# Patient Record
Sex: Female | Born: 1935 | Race: Black or African American | Hispanic: No | Smoking: Former smoker
Health system: Southern US, Community
[De-identification: ages and names within clinical notes are randomized; demographics above are authoritative.]

## PROBLEM LIST (undated history)

## (undated) DIAGNOSIS — E785 Hyperlipidemia, unspecified: Secondary | ICD-10-CM

## (undated) DIAGNOSIS — I10 Essential (primary) hypertension: Secondary | ICD-10-CM

## (undated) DIAGNOSIS — C569 Malignant neoplasm of unspecified ovary: Secondary | ICD-10-CM

## (undated) HISTORY — DX: Essential (primary) hypertension: I10

## (undated) HISTORY — DX: Hyperlipidemia, unspecified: E78.5

## (undated) HISTORY — PX: ABDOMINAL HYSTERECTOMY: SHX81

---

## 1997-08-01 ENCOUNTER — Other Ambulatory Visit: Admission: RE | Admit: 1997-08-01 | Discharge: 1997-08-01 | Payer: Self-pay | Admitting: Obstetrics & Gynecology

## 1998-10-27 ENCOUNTER — Other Ambulatory Visit: Admission: RE | Admit: 1998-10-27 | Discharge: 1998-10-27 | Payer: Self-pay | Admitting: *Deleted

## 1999-02-18 ENCOUNTER — Encounter: Payer: Self-pay | Admitting: Internal Medicine

## 1999-02-18 ENCOUNTER — Encounter: Admission: RE | Admit: 1999-02-18 | Discharge: 1999-02-18 | Payer: Self-pay | Admitting: Internal Medicine

## 2000-02-05 ENCOUNTER — Other Ambulatory Visit: Admission: RE | Admit: 2000-02-05 | Discharge: 2000-02-05 | Payer: Self-pay | Admitting: Obstetrics and Gynecology

## 2001-09-13 ENCOUNTER — Other Ambulatory Visit: Admission: RE | Admit: 2001-09-13 | Discharge: 2001-09-13 | Payer: Self-pay | Admitting: Obstetrics and Gynecology

## 2004-05-13 ENCOUNTER — Other Ambulatory Visit: Admission: RE | Admit: 2004-05-13 | Discharge: 2004-05-13 | Payer: Self-pay | Admitting: Obstetrics and Gynecology

## 2004-05-19 ENCOUNTER — Ambulatory Visit (HOSPITAL_COMMUNITY): Admission: RE | Admit: 2004-05-19 | Discharge: 2004-05-19 | Payer: Self-pay | Admitting: Obstetrics and Gynecology

## 2004-11-24 ENCOUNTER — Ambulatory Visit: Payer: Self-pay | Admitting: Internal Medicine

## 2004-12-01 ENCOUNTER — Ambulatory Visit: Payer: Self-pay | Admitting: Internal Medicine

## 2005-05-31 ENCOUNTER — Ambulatory Visit: Payer: Self-pay | Admitting: Internal Medicine

## 2005-06-02 ENCOUNTER — Ambulatory Visit: Payer: Self-pay | Admitting: Internal Medicine

## 2005-07-20 ENCOUNTER — Ambulatory Visit: Payer: Self-pay | Admitting: Internal Medicine

## 2005-08-25 ENCOUNTER — Ambulatory Visit: Payer: Self-pay | Admitting: Internal Medicine

## 2005-11-25 ENCOUNTER — Ambulatory Visit: Payer: Self-pay | Admitting: Internal Medicine

## 2005-12-05 ENCOUNTER — Encounter: Admission: RE | Admit: 2005-12-05 | Discharge: 2005-12-05 | Payer: Self-pay | Admitting: Internal Medicine

## 2006-12-21 ENCOUNTER — Encounter: Payer: Self-pay | Admitting: *Deleted

## 2006-12-21 DIAGNOSIS — E785 Hyperlipidemia, unspecified: Secondary | ICD-10-CM

## 2006-12-21 DIAGNOSIS — I10 Essential (primary) hypertension: Secondary | ICD-10-CM | POA: Insufficient documentation

## 2006-12-21 DIAGNOSIS — Z9079 Acquired absence of other genital organ(s): Secondary | ICD-10-CM | POA: Insufficient documentation

## 2007-01-16 ENCOUNTER — Ambulatory Visit: Payer: Self-pay | Admitting: Internal Medicine

## 2007-01-16 LAB — CONVERTED CEMR LAB
ALT: 25 units/L (ref 0–35)
AST: 29 units/L (ref 0–37)
Albumin: 4.2 g/dL (ref 3.5–5.2)
Alkaline Phosphatase: 62 units/L (ref 39–117)
BUN: 16 mg/dL (ref 6–23)
Bilirubin, Direct: 0.1 mg/dL (ref 0.0–0.3)
CO2: 30 meq/L (ref 19–32)
Calcium: 9.5 mg/dL (ref 8.4–10.5)
Chloride: 103 meq/L (ref 96–112)
Cholesterol: 181 mg/dL (ref 0–200)
Creatinine, Ser: 0.7 mg/dL (ref 0.4–1.2)
GFR calc Af Amer: 107 mL/min
GFR calc non Af Amer: 88 mL/min
Glucose, Bld: 116 mg/dL — ABNORMAL HIGH (ref 70–99)
HDL: 53.8 mg/dL (ref >39.0)
Hgb A1c MFr Bld: 7 % — ABNORMAL HIGH (ref 4.6–6.0)
LDL Cholesterol: 114 mg/dL — ABNORMAL HIGH (ref 0–99)
Potassium: 4.3 meq/L (ref 3.5–5.1)
Sodium: 140 meq/L (ref 135–145)
Total Bilirubin: 1 mg/dL (ref 0.3–1.2)
Total CHOL/HDL Ratio: 3.4
Total Protein: 7.7 g/dL (ref 6.0–8.3)
Triglycerides: 64 mg/dL (ref 0–149)
VLDL: 13 mg/dL (ref 0–40)

## 2007-01-20 ENCOUNTER — Encounter: Payer: Self-pay | Admitting: Internal Medicine

## 2007-01-24 ENCOUNTER — Ambulatory Visit: Payer: Self-pay | Admitting: Internal Medicine

## 2007-02-07 ENCOUNTER — Encounter: Payer: Self-pay | Admitting: Internal Medicine

## 2007-02-07 ENCOUNTER — Ambulatory Visit: Payer: Self-pay | Admitting: Internal Medicine

## 2007-02-12 ENCOUNTER — Encounter: Payer: Self-pay | Admitting: Internal Medicine

## 2007-02-14 ENCOUNTER — Telehealth: Payer: Self-pay | Admitting: Internal Medicine

## 2007-02-16 ENCOUNTER — Ambulatory Visit: Payer: Self-pay | Admitting: Internal Medicine

## 2008-04-02 ENCOUNTER — Ambulatory Visit: Payer: Self-pay | Admitting: Internal Medicine

## 2008-04-02 DIAGNOSIS — E119 Type 2 diabetes mellitus without complications: Secondary | ICD-10-CM | POA: Insufficient documentation

## 2008-04-02 LAB — CONVERTED CEMR LAB
ALT: 18 units/L (ref 0–35)
AST: 21 units/L (ref 0–37)
Albumin: 4.1 g/dL (ref 3.5–5.2)
Alkaline Phosphatase: 51 units/L (ref 39–117)
BUN: 13 mg/dL (ref 6–23)
Bilirubin, Direct: 0.1 mg/dL (ref 0.0–0.3)
CO2: 32 meq/L (ref 19–32)
Calcium: 9.7 mg/dL (ref 8.4–10.5)
Chloride: 106 meq/L (ref 96–112)
Cholesterol: 178 mg/dL (ref 0–200)
Creatinine, Ser: 0.8 mg/dL (ref 0.4–1.2)
GFR calc Af Amer: 91 mL/min
GFR calc non Af Amer: 75 mL/min
Glucose, Bld: 119 mg/dL — ABNORMAL HIGH (ref 70–99)
HDL: 55.3 mg/dL (ref >39.0)
Hgb A1c MFr Bld: 7.2 % — ABNORMAL HIGH (ref 4.6–6.0)
LDL Cholesterol: 105 mg/dL — ABNORMAL HIGH (ref 0–99)
Potassium: 3.5 meq/L (ref 3.5–5.1)
Sodium: 142 meq/L (ref 135–145)
TSH: 1.22 microintl units/mL (ref 0.35–5.50)
Total Bilirubin: 0.7 mg/dL (ref 0.3–1.2)
Total CHOL/HDL Ratio: 3.2
Total Protein: 7.5 g/dL (ref 6.0–8.3)
Triglycerides: 88 mg/dL (ref 0–149)
VLDL: 18 mg/dL (ref 0–40)

## 2008-04-09 ENCOUNTER — Encounter: Payer: Self-pay | Admitting: Internal Medicine

## 2008-04-10 ENCOUNTER — Encounter: Payer: Self-pay | Admitting: Internal Medicine

## 2008-08-26 ENCOUNTER — Encounter: Payer: Self-pay | Admitting: Internal Medicine

## 2008-09-11 ENCOUNTER — Encounter: Payer: Self-pay | Admitting: Internal Medicine

## 2009-03-17 ENCOUNTER — Telehealth: Payer: Self-pay | Admitting: Internal Medicine

## 2009-04-15 ENCOUNTER — Encounter: Payer: Self-pay | Admitting: Internal Medicine

## 2009-04-16 ENCOUNTER — Telehealth (INDEPENDENT_AMBULATORY_CARE_PROVIDER_SITE_OTHER): Payer: Self-pay | Admitting: *Deleted

## 2009-07-17 ENCOUNTER — Ambulatory Visit: Payer: Self-pay | Admitting: Internal Medicine

## 2009-07-17 ENCOUNTER — Telehealth: Payer: Self-pay | Admitting: Internal Medicine

## 2009-07-17 LAB — CONVERTED CEMR LAB
ALT: 20 units/L (ref 0–35)
AST: 23 units/L (ref 0–37)
Albumin: 4.2 g/dL (ref 3.5–5.2)
Alkaline Phosphatase: 61 units/L (ref 39–117)
BUN: 17 mg/dL (ref 6–23)
Basophils Absolute: 0 10*3/uL (ref 0.0–0.1)
Basophils Relative: 0.3 % (ref 0.0–3.0)
Bilirubin, Direct: 0.1 mg/dL (ref 0.0–0.3)
CO2: 32 meq/L (ref 19–32)
Calcium: 9.5 mg/dL (ref 8.4–10.5)
Chloride: 102 meq/L (ref 96–112)
Cholesterol: 158 mg/dL (ref 0–200)
Creatinine, Ser: 0.7 mg/dL (ref 0.4–1.2)
Eosinophils Absolute: 0.1 10*3/uL (ref 0.0–0.7)
Eosinophils Relative: 2.3 % (ref 0.0–5.0)
GFR calc non Af Amer: 115.25 mL/min (ref >60)
Glucose, Bld: 111 mg/dL — ABNORMAL HIGH (ref 70–99)
HCT: 37.3 % (ref 36.0–46.0)
HDL: 52.8 mg/dL (ref >39.00)
Hemoglobin: 12 g/dL (ref 12.0–15.0)
Hgb A1c MFr Bld: 7.5 % — ABNORMAL HIGH (ref 4.6–6.5)
LDL Cholesterol: 85 mg/dL (ref 0–99)
Lymphocytes Relative: 36.1 % (ref 12.0–46.0)
Lymphs Abs: 2 10*3/uL (ref 0.7–4.0)
MCHC: 32.3 g/dL (ref 30.0–36.0)
MCV: 72.2 fL — ABNORMAL LOW (ref 78.0–100.0)
Monocytes Absolute: 0.4 10*3/uL (ref 0.1–1.0)
Monocytes Relative: 6.9 % (ref 3.0–12.0)
Neutro Abs: 3 10*3/uL (ref 1.4–7.7)
Neutrophils Relative %: 54.4 % (ref 43.0–77.0)
Platelets: 215 10*3/uL (ref 150.0–400.0)
Potassium: 4.3 meq/L (ref 3.5–5.1)
RBC: 5.16 M/uL — ABNORMAL HIGH (ref 3.87–5.11)
RDW: 15.1 % — ABNORMAL HIGH (ref 11.5–14.6)
Sodium: 141 meq/L (ref 135–145)
TSH: 1.3 microintl units/mL (ref 0.35–5.50)
Total Bilirubin: 0.6 mg/dL (ref 0.3–1.2)
Total CHOL/HDL Ratio: 3
Total Protein: 6.9 g/dL (ref 6.0–8.3)
Triglycerides: 100 mg/dL (ref 0.0–149.0)
VLDL: 20 mg/dL (ref 0.0–40.0)
WBC: 5.5 10*3/uL (ref 4.5–10.5)

## 2010-03-31 NOTE — Assessment & Plan Note (Signed)
Summary: YEARLY FU/ MEDICARE/ NWS  #   Vital Signs:  Patient profile:   75 year old female Height:      65 inches Weight:      176 pounds BMI:     29.39 O2 Sat:      98 % on Room air Temp:     97.1 degrees F oral Pulse rate:   72 / minute BP sitting:   136 / 88  (left arm) Cuff size:   regular  Vitals Entered By: Bill Salinas CMA (Jul 17, 2009 10:10 AM)  O2 Flow:  Room air CC: pt here for cpx, she is due for a tetanus shot. she has never had pneumonia vaccine or shingles vaccine. Pt has never had bone density/ ab  Vision Screening:      Vision Comments: Pt had last eye exam ? of about 2 years ago at [facility]. She is due for an eye exam.   Primary Care Provider:  [facility]  CC:  pt here for cpx and she is due for a tetanus shot. she has never had pneumonia vaccine or shingles vaccine. Pt has never had bone density/ ab.  History of Present Illness: Patient is here for yearly physical exam.  She reports relatively good health over the year.  She describes some cough symptoms throughout the spring months that she attributes to allergy.  She also describes occasional difficulty with sleep.    She has checked home blood pressures and gets readings from 120-130/80 or less.    Patient has had difficulty with maintaining an exercise program.  She does say that she has made an effort to decrease starch and sugar intake.    Current Medications (verified): 1)  Vytorin 10-40 Mg  Tabs (Ezetimibe-Simvastatin) .... Take One Tablet Once Daily 2)  Multivitamins   Tabs (Multiple Vitamin) .Marland Kitchen.. 1 By Mouth Every Other Day 3)  Lisinopril-Hydrochlorothiazide 20-12.5 Mg Tabs (Lisinopril-Hydrochlorothiazide) .Marland Kitchen.. 1 By Mouth Once Daily  Allergies (verified): 1)  ! Codeine  Past History:  Past Medical History: Last updated: 2008-04-24 DIABETES MELLITUS, TYPE II, MILD (ICD-250.00) HYPERLIPIDEMIA (ICD-272.4) HYPERTENSION, BORDERLINE (ICD-401.9)  Past Surgical History: Last updated:  12/21/2006 TOTAL ABDOMINAL HYSTERECTOMY, HX OF (ICD-V45.77)  Family History: Last updated: 04-24-2008 mother-deceased @ 58: HTN, CVA father - deceased @ 39: ?? Neg- breast or colon cancer; CAD Brother - DM Brother - bone cancer  Social History: Last updated: 2008-04-24 HSG married '59 - 60yrs/divorced; married '71 - 45yrs/divorced 1 son - '74 Autistic (at home); 1 daughter - '70; 2 grandchildren Retired from [employer]  Review of Systems  The patient denies anorexia, fever, weight loss, weight gain, vision loss, decreased hearing, chest pain, syncope, dyspnea on exertion, peripheral edema, prolonged cough, headaches, abdominal pain, melena, hematochezia, severe indigestion/heartburn, muscle weakness, difficulty walking, unusual weight change, abnormal bleeding, and enlarged lymph nodes.    Physical Exam  General:  Alert, welldeveloped woman who appears younger than chronological age. Head:  normocephalic and atraumatic.   Eyes:  vision grossly intact, pupils equal, pupils round, pupils reactive to light, and no injection.  Eye exam attempted but limited due to constricted pupils. Ears:  R ear normal and L ear normal.   Nose:  no external deformity.   Mouth:  pharynx pink and moist.   Neck:  supple, no masses, no thyromegaly, and no thyroid nodules or tenderness.   Breasts:  skin/areolae normal, no masses, no abnormal thickening, no nipple discharge, no tenderness, and no adenopathy.   Lungs:  normal  respiratory effort, no accessory muscle use, normal breath sounds, no crackles, and no wheezes.   Heart:  normal rate, regular rhythm, no murmur, no gallop, and no rub.   Abdomen:  soft, non-tender, normal bowel sounds, no distention, no masses, no guarding, no rigidity, no rebound tenderness, no hepatomegaly, and no splenomegaly.   Msk:  normal ROM, no joint tenderness, no joint swelling, no joint warmth, and no redness over joints.   Pulses:  R radial normal.   Extremities:  no pedal  edema Neurologic:  alert & oriented X3, cranial nerves II-XII intact, strength normal in all extremities, and gait normal.   Cervical Nodes:  no anterior cervical adenopathy and no posterior cervical adenopathy.    Diabetes Management Exam:    Foot Exam (with socks and/or shoes not present):       Sensory-Pinprick/Light touch:          Left medial foot (L-4): normal          Left dorsal foot (L-5): normal          Left lateral foot (S-1): normal          Right medial foot (L-4): normal          Right dorsal foot (L-5): normal          Right lateral foot (S-1): normal       Inspection:          Left foot: normal          Right foot: normal       Nails:          Left foot: normal          Right foot: normal   Impression & Recommendations:  Problem # 1:  HYPERTENSION, BORDERLINE (ICD-401.9) Home blood pressure readings are at goal.  Patient has been adherent to medication plan.    Plan- No changes.  Check electrolytes.  Her updated medication list for this problem includes:    Lisinopril-hydrochlorothiazide 20-12.5 Mg Tabs (Lisinopril-hydrochlorothiazide) .Marland Kitchen... 1 by mouth once daily  Orders: TLB-BMP (Basic Metabolic Panel-BMET) (80048-METABOL) TLB-CBC Platelet - w/Differential (85025-CBCD)  Addendum - lab results are fine  Problem # 2:  HYPERLIPIDEMIA (ICD-272.4) Patient has had no difficulty with her medication.  Plan- Continue medication          Check lipids and LFTs.  Her updated medication list for this problem includes:    Vytorin 10-40 Mg Tabs (Ezetimibe-simvastatin) .Marland Kitchen... Take one tablet once daily  Orders: Prescription Created Electronically [id number]) TLB-Lipid Panel (80061-LIPID) TLB-Hepatic/Liver Function Pnl (80076-HEPATIC) TLB-TSH (Thyroid Stimulating Hormone) (84443-TSH)  Addendum - LDL at goal of less than 100. Liver functions are normal  Plan - continue present medications  Problem # 3:  DIABETES MELLITUS, TYPE II, MILD (ICD-250.00) Patient has had  a slowly increasing A1c over the past few years but has been attempting lifestyle control.  Will check A1c today but patient will likely require initiation of oral medication.  Foot exam today is normal.  Patient has agreed to set up an appointment to get her eyes checked.   Plan- A1c today- start Metformin if above 7.0%          continue lifestyle changes           Her updated medication list for this problem includes:    Lisinopril-hydrochlorothiazide 20-12.5 Mg Tabs (Lisinopril-hydrochlorothiazide) .Marland Kitchen... 1 by mouth once daily    Metformin Hcl 500 Mg Tabs (Metformin hcl) .Marland Kitchen... 1 by mouth two times a day  for diabetes  Addendum - A1C 7.5%  Plan - start metformin.  Orders: TLB-A1C / Hgb A1C (Glycohemoglobin) (83036-A1C) Prescription Created Electronically [phone])  Problem # 4:  Preventive Health Care (ICD-V70.0) Colonoscopy- completed in 2008 by [place].  repeat due 2013-2015 Mammogram- Some abnormality found on last year's mammogram leading to repeat mammogram last year.  Her most recent mammogram was in Feb 2011, which had no evidence of abnormality.  She will resume yearly mammograms.  Breast exam today showed no areas of concern. Patient by history and presentation has no evidence of depression. Pap- hysterectomy Eye exam- Patient to make appointment Vaccines- patient is not up to date on vaccines.    Complete Medication List: 1)  Vytorin 10-40 Mg Tabs (Ezetimibe-simvastatin) .... Take one tablet once daily 2)  Multivitamins Tabs (Multiple vitamin) .Marland Kitchen.. 1 by mouth every other day 3)  Lisinopril-hydrochlorothiazide 20-12.5 Mg Tabs (Lisinopril-hydrochlorothiazide) .Marland Kitchen.. 1 by mouth once daily 4)  Metformin Hcl 500 Mg Tabs (Metformin hcl) .Marland Kitchen.. 1 by mouth two times a day for diabetes  Other Orders: Subsequent annual wellness visit with prevention plan (Z3086) Subsequent annual wellness visit with prevention plan (V7846)  Patient: Leslie Rowe Note: All result statuses are Final unless  otherwise noted.  Tests: (1) Hemoglobin A1C (A1C)   Hemoglobin A1C       [H]  7.5 %                       4.6-6.5     Glycemic Control Guidelines for People with Diabetes:     Non Diabetic:  <6%     Goal of Therapy: <7%     Additional Action Suggested:  >8%   Tests: (2) Lipid Panel (LIPID)   Cholesterol               158 mg/dL                   9-629     ATP III Classification            Desirable:  < 200 mg/dL                    Borderline High:  200 - 239 mg/dL               High:  > = 240 mg/dL   Triglycerides             100.0 mg/dL                 5.2-841.3     Normal:  <150 mg/dL     Borderline High:  244 - 199 mg/dL   HDL                       01.02 mg/dL                 >72.53   VLDL Cholesterol          20.0 mg/dL                  6.6-44.0   LDL Cholesterol           85 mg/dL                    3-47  CHO/HDL Ratio:  CHD Risk  3                    Men          Women     1/2 Average Risk     3.4          3.3     Average Risk          5.0          4.4     2X Average Risk          9.6          7.1     3X Average Risk          15.0          11.0                           Tests: (3) Hepatic/Liver Function Panel (HEPATIC)   Total Bilirubin           0.6 mg/dL                   1.6-1.0   Direct Bilirubin          0.1 mg/dL                   9.6-0.4   Alkaline Phosphatase      61 U/L                      39-117   AST                       23 U/L                      0-37   ALT                       20 U/L                      0-35   Total Protein             6.9 g/dL                    5.4-0.9   Albumin                   4.2 g/dL                    8.1-1.9  Tests: (4) BMP (METABOL)   Sodium                    141 mEq/L                   135-145   Potassium                 4.3 mEq/L                   3.5-5.1   Chloride                  102 mEq/L                   96-112   Carbon Dioxide            32 mEq/L  19-32   Glucose               [H]  111 mg/dL                   16-10   BUN                       17 mg/dL                    9-60   Creatinine                0.7 mg/dL                   4.5-4.0   Calcium                   9.5 mg/dL                   9.8-11.9   GFR                       115.25 mL/min               >60  Tests: (5) CBC Platelet w/Diff (CBCD)   White Cell Count          5.5 K/uL                    4.5-10.5   Red Cell Count       [H]  5.16 Mil/uL                 3.87-5.11   Hemoglobin                12.0 g/dL                   14.7-82.9   Hematocrit                37.3 %                      36.0-46.0   MCV                  [L]  72.2 fl                     78.0-100.0   MCHC                      32.3 g/dL                   56.2-13.0   RDW                  [H]  15.1 %                      11.5-14.6   Platelet Count            215.0 K/uL                  150.0-400.0   Neutrophil %              54.4 %                      43.0-77.0   Lymphocyte %              36.1 %  12.0-46.0   Monocyte %                6.9 %                       3.0-12.0   Eosinophils%              2.3 %                       0.0-5.0   Basophils %               0.3 %                       0.0-3.0   Neutrophill Absolute      3.0 K/uL                    1.4-7.7   Lymphocyte Absolute       2.0 K/uL                    0.7-4.0   Monocyte Absolute         0.4 K/uL                    0.1-1.0  Eosinophils, Absolute                             0.1 K/uL                    0.0-0.7   Basophils Absolute        0.0 K/uL                    0.0-0.1  Tests: (6) TSH (TSH)   FastTSH                   1.30 uIU/mL                 0.35-5.50Prescriptions: METFORMIN HCL 500 MG TABS (METFORMIN HCL) 1 by mouth two times a day for diabetes  #60 x 12   Entered and Authorized by:   Jacques Navy MD   Signed by:   Jacques Navy MD on 07/17/2009   Method used:   Electronically to        [facility] [address] [phone]* (retail)        [address]       [place], [place]  [address]       Ph: [phone]       Fax: [phone]   RxID:   [id number] VYTORIN 10-40 MG  TABS (EZETIMIBE-SIMVASTATIN) Take one tablet once daily  #30 Tablet x 12   Entered and Authorized by:   Jacques Navy MD   Signed by:   Jacques Navy MD on 07/17/2009   Method used:   Electronically to        [facility] [address] [phone]* (retail)       [address]       [place], [place]  [address]       Ph: [phone]       Fax: [phone]   RxID:   [id number] LISINOPRIL-HYDROCHLOROTHIAZIDE 20-12.5 MG TABS (LISINOPRIL-HYDROCHLOROTHIAZIDE) 1 by mouth once daily  #30 x 12   Entered and Authorized by:   Jacques Navy MD   Signed by:  Jacques Navy MD on 07/17/2009   Method used:   Electronically to        [facility] [address] [phone]* (retail)       [address]       [place], [place]  [address]       Ph: [phone]       Fax: [phone]   RxID:   [id number]    Preventive Care Screening     repeat colon 5 to 7 year from 2008

## 2010-03-31 NOTE — Progress Notes (Signed)
    Preventive Care Screening  Mammogram:    Date:  04/15/2009    Results:  normal  

## 2010-03-31 NOTE — Progress Notes (Signed)
  Phone Note Outgoing Call   Reason for Call: Discuss lab or test results Summary of Call: PLEASE call patient: A1C 7.5% - please start metform 500mg  once a day x 7-10 days then two times a day.   Thanks Initial call taken by: Jacques Navy MD,  Jul 17, 2009 9:17 PM  Follow-up for Phone Call        lmoam for pt to call back Follow-up by: Ami Bullins CMA,  Jul 18, 2009 10:37 AM  Additional Follow-up for Phone Call Additional follow up Details #1::        informed pt Additional Follow-up by: Ami Bullins CMA,  Jul 18, 2009 11:56 AM

## 2010-03-31 NOTE — Progress Notes (Signed)
  Phone Note Refill Request Message from:  Fax from Pharmacy on March 17, 2009 9:38 AM  Refills Requested: Medication #1:  LISINOPRIL-HYDROCHLOROTHIAZIDE 20-12.5 MG TABS 1 by mouth once daily.    Prescriptions: LISINOPRIL-HYDROCHLOROTHIAZIDE 20-12.5 MG TABS (LISINOPRIL-HYDROCHLOROTHIAZIDE) 1 by mouth once daily  #30 x 6   Entered by:   Ami Bullins CMA   Authorized by:   Jacques Navy MD   Signed by:   Bill Salinas CMA on 03/17/2009   Method used:   Electronically to        [facility] [address] [phone]* (retail)       [address]       [place], [place]  [address]       Ph: [phone]       Fax: [phone]   RxID:   [id number]

## 2010-04-27 ENCOUNTER — Encounter: Payer: Self-pay | Admitting: Internal Medicine

## 2010-07-25 ENCOUNTER — Other Ambulatory Visit: Payer: Self-pay | Admitting: Internal Medicine

## 2010-09-28 ENCOUNTER — Encounter: Payer: Self-pay | Admitting: Internal Medicine

## 2010-09-30 ENCOUNTER — Other Ambulatory Visit (INDEPENDENT_AMBULATORY_CARE_PROVIDER_SITE_OTHER): Payer: Medicare Other

## 2010-09-30 ENCOUNTER — Ambulatory Visit (INDEPENDENT_AMBULATORY_CARE_PROVIDER_SITE_OTHER): Payer: Medicare Other | Admitting: Internal Medicine

## 2010-09-30 VITALS — BP 132/80 | HR 85 | Temp 98.2°F | Wt 169.0 lb

## 2010-09-30 DIAGNOSIS — Z23 Encounter for immunization: Secondary | ICD-10-CM

## 2010-09-30 DIAGNOSIS — E785 Hyperlipidemia, unspecified: Secondary | ICD-10-CM

## 2010-09-30 DIAGNOSIS — E119 Type 2 diabetes mellitus without complications: Secondary | ICD-10-CM

## 2010-09-30 DIAGNOSIS — Z136 Encounter for screening for cardiovascular disorders: Secondary | ICD-10-CM

## 2010-09-30 DIAGNOSIS — Z0000 Encounter for general adult medical examination without abnormal findings: Secondary | ICD-10-CM

## 2010-09-30 DIAGNOSIS — I10 Essential (primary) hypertension: Secondary | ICD-10-CM

## 2010-09-30 LAB — LIPID PANEL
Cholesterol: 137 mg/dL (ref 0–200)
HDL: 53.9 mg/dL (ref >39.00)
LDL Cholesterol: 64 mg/dL (ref 0–99)
Triglycerides: 98 mg/dL (ref 0.0–149.0)
VLDL: 19.6 mg/dL (ref 0.0–40.0)

## 2010-09-30 MED ORDER — PNEUMOCOCCAL VAC POLYVALENT 25 MCG/0.5ML IJ INJ: 0.5000 mL | INJECTION | Freq: Once | INTRAMUSCULAR | Status: DC

## 2010-09-30 NOTE — Progress Notes (Signed)
Subjective:    Patient ID: Leslie Rowe, female    DOB: Jul 27, 1935, 75 y.o.   MRN: [id number]  HPI   The patient is here for annual Medicare wellness examination and management of other chronic and acute problems. She reports that things are going well.    The risk factors are reflected in the social history.  The roster of all physicians providing medical care to patient - is listed in the Snapshot section of the chart.  Activities of daily living:  The patient is 100% inedpendent in all ADLs: dressing, toileting, feeding as well as independent mobility  Home safety : The patient has smoke detectors in the home. They wear seatbelts. No firearms at home. NO falls or balance problems.  There is no risks for hepatitis, STDs or HIV. There is no   history of blood transfusion. They have no travel history to infectious disease endemic areas of the world.  The patient has seen their dentist in the last six month. They have seen their eye doctor in the last year. They admit to hearing difficulty and have not had audiologic testing in the last year.  They do not  have excessive sun exposure. Discussed the need for sun protection: hats, long sleeves and use of sunscreen if there is significant sun exposure.   Diet: the importance of a healthy diet is discussed. They do have a healthy (unhealthy-high fat/fast food) diet.  The patient does not have a regular exercise program.  The benefits of regular aerobic exercise were discussed.  Depression screen: there are no signs or vegative symptoms of depression- irritability, change in appetite, anhedonia, sadness/tearfullness.  Cognitive assessment: the patient manages all their financial and personal affairs and is actively engaged. They could relate day,date,year and events; recalled 3/3 objects at 3 minutes.  The following portions of the patient's history were reviewed and updated as appropriate: allergies, current medications, past family  history, past medical history,  past surgical history, past social history  and problem list.  Vision, hearing, body mass index were assessed and reviewed.   During the course of the visit the patient was educated and counseled about appropriate screening and preventive services including : fall prevention , diabetes screening, nutrition counseling, colorectal cancer screening, and recommended immunizations.   Past Medical History  Diagnosis Date  . Diabetes mellitus     type II, mild  . Hyperlipidemia   . Hypertension     borderline   Past Surgical History  Procedure Date  . Abdominal hysterectomy     total   Family History  Problem Relation Age of Onset  . Hypertension Mother   . Other Mother     CVa  . Cancer Brother     Bone  . Diabetes Brother   . Other Son     Autistic   History   Social History  . Marital Status: Divorced    Spouse Name: N/A    Number of Children: N/A  . Years of Education: N/A   Occupational History  . 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 Smoking status: Not on file  . Smokeless tobacco: Not on file  . Alcohol Use:   . Drug Use:   . Sexually Active:    Other Topics Concern  . Not on file   Social History Narrative  . No narrative on file      Review of Systems Review of Systems  Constitutional:  Negative for fever, chills, activity  change and unexpected weight change.  HEENT:  Negative for hearing loss, ear pain, congestion, neck stiffness and postnasal drip. Negative for sore throat or swallowing problems. Negative for dental complaints.   Eyes: Negative for vision loss or change in visual acuity.  Respiratory: Negative for chest tightness and wheezing.   Cardiovascular: Negative for chest pain and palpitation. No decreased exercise tolerance Gastrointestinal: No change in bowel habit. No bloating or gas. No reflux or indigestion Genitourinary: Negative for urgency, frequency, flank pain and difficulty  urinating.  Musculoskeletal: Negative for myalgias, back pain, arthralgias and gait problem.  Neurological: Negative for dizziness, tremors, weakness and headaches.  Hematological: Negative for adenopathy.  Psychiatric/Behavioral: Negative for behavioral problems and dysphoric mood.        Objective:   Physical Exam Vitals reviewed. normal Gen'l: well nourished, well developed AA woman in no distress, looking younger than  HEENT - [place]/AT, EACs/TMs normal, oropharynx with native dentition in good condition, no buccal or palatal lesions, posterior pharynx clear, mucous membranes moist. C&S clear, PERRLA, fundi - normal Neck - supple, no thyromegaly Nodes- negative submental, cervical, supraclavicular regions Chest - no deformity, no CVAT Lungs - cleat without rales, wheezes. No increased work of breathing Breast - skin normal, nipples w/o discharge, minor fibrocystic changes noted, no fixed mass or abnormality, no axillary adenopathy. Cardiovascular - regular rate and rhythm, quiet precordium, no murmurs, rubs or gallops, 2+ radial, DP and PT pulses Abdomen - BS+ x 4, no HSM, no guarding or rebound or tenderness Pelvic - deferred s/p hysterectomy Rectal - deferred to gyn Extremities - no clubbing, cyanosis, edema or deformity.  Neuro - A&O x 3, CN II-XII normal, motor strength normal and equal, DTRs 2+ and symmetrical biceps, radial, and patellar tendons. Cerebellar - no tremor, no rigidity, fluid movement and normal gait. Derm - Head, neck, back, abdomen and extremities without suspicious lesions  Lab Results  Component Value Date   WBC 5.5 07/17/2009   HGB 12.0 07/17/2009   HCT 37.3 07/17/2009   PLT 215.0 07/17/2009   CHOL 137 09/30/2010   TRIG 98.0 09/30/2010   HDL 53.90 09/30/2010   ALT 15 09/30/2010   ALT 15 09/30/2010   AST 17 09/30/2010   AST 17 09/30/2010   NA 141 09/30/2010   K 3.7 09/30/2010   CL 102 09/30/2010   CREATININE 0.8 09/30/2010   BUN 18 09/30/2010   CO2 31 09/30/2010   TSH 1.39  09/30/2010   HGBA1C 7.7* 09/30/2010   Lab Results  Component Value Date   LDLCALC 64 09/30/2010           Assessment & Plan:

## 2010-10-01 LAB — COMPREHENSIVE METABOLIC PANEL
ALT: 15 U/L (ref 0–35)
AST: 17 U/L (ref 0–37)
Albumin: 4.4 g/dL (ref 3.5–5.2)
Alkaline Phosphatase: 53 U/L (ref 39–117)
Calcium: 9.4 mg/dL (ref 8.4–10.5)
Chloride: 102 mEq/L (ref 96–112)
Potassium: 3.7 mEq/L (ref 3.5–5.1)
Sodium: 141 mEq/L (ref 135–145)
Total Protein: 7.1 g/dL (ref 6.0–8.3)

## 2010-10-01 LAB — HEPATIC FUNCTION PANEL
Albumin: 4.4 g/dL (ref 3.5–5.2)
Alkaline Phosphatase: 53 U/L (ref 39–117)
Total Protein: 7.1 g/dL (ref 6.0–8.3)

## 2010-10-02 NOTE — Assessment & Plan Note (Signed)
LDL is at goal and she is doing well: tolerated vytorin 10/40 with no adverse effects.  Plan - repeat lipid panel in 1 year.

## 2010-10-02 NOTE — Assessment & Plan Note (Addendum)
Lab Results  Component Value Date   HGBA1C 7.7* 09/30/2010   Sub optimal control but not to the degree that will require a change in medication.  Plan - continue present regimen - metformin 500 mg twice a day           More attention to diet: no sugared products and reduce carbohydrates           Repeat A1C in 3 months - order entered for November 5th

## 2010-10-02 NOTE — Assessment & Plan Note (Signed)
BP Readings from Last 3 Encounters:  09/30/10 132/80  07/17/09 136/88  04/02/08 140/84   Acceptable blood pressures. Will continue present medication

## 2010-10-02 NOTE — Assessment & Plan Note (Addendum)
Interval medical history is unremarkable. Physical exam is normal sans pelvic and rectal exam. Lab results are in normal limits except for A1C. She is current with breasthealth with last mammogram Feb '11. She is current with colorectal cancer screening with last study Dec '08. Immunizations: Tdap and pneumonia vaccine today. She will check on insurance coverage for shingles vaccine. 121 lead EKG without signs of ischemia or injury.  In summary - a very nice woman who appears to be medically stable. She will return for lab in 3 months. She will return for OV as needed or in 1 year.

## 2010-10-05 ENCOUNTER — Encounter: Payer: Self-pay | Admitting: Internal Medicine

## 2011-01-04 ENCOUNTER — Other Ambulatory Visit (INDEPENDENT_AMBULATORY_CARE_PROVIDER_SITE_OTHER): Payer: Medicare Other

## 2011-01-04 DIAGNOSIS — E119 Type 2 diabetes mellitus without complications: Secondary | ICD-10-CM

## 2011-01-04 LAB — HEMOGLOBIN A1C: Hgb A1c MFr Bld: 7.1 % — ABNORMAL HIGH (ref 4.6–6.5)

## 2011-01-11 ENCOUNTER — Encounter: Payer: Self-pay | Admitting: Internal Medicine

## 2011-01-11 DIAGNOSIS — E119 Type 2 diabetes mellitus without complications: Secondary | ICD-10-CM

## 2011-01-11 DIAGNOSIS — E785 Hyperlipidemia, unspecified: Secondary | ICD-10-CM

## 2011-05-17 ENCOUNTER — Encounter: Payer: Self-pay | Admitting: Internal Medicine

## 2011-08-22 ENCOUNTER — Other Ambulatory Visit: Payer: Self-pay | Admitting: Internal Medicine

## 2011-08-23 NOTE — Telephone Encounter (Signed)
OV scheduled 08.05.13/SLS

## 2011-10-04 ENCOUNTER — Encounter: Payer: Medicare Other | Admitting: Internal Medicine

## 2011-10-08 ENCOUNTER — Other Ambulatory Visit (INDEPENDENT_AMBULATORY_CARE_PROVIDER_SITE_OTHER): Payer: Medicare Other

## 2011-10-08 ENCOUNTER — Encounter: Payer: Self-pay | Admitting: Internal Medicine

## 2011-10-08 ENCOUNTER — Ambulatory Visit (INDEPENDENT_AMBULATORY_CARE_PROVIDER_SITE_OTHER): Payer: Medicare Other | Admitting: Internal Medicine

## 2011-10-08 VITALS — BP 142/80 | HR 88 | Temp 97.8°F | Resp 16 | Wt 171.0 lb

## 2011-10-08 DIAGNOSIS — E119 Type 2 diabetes mellitus without complications: Secondary | ICD-10-CM

## 2011-10-08 DIAGNOSIS — E785 Hyperlipidemia, unspecified: Secondary | ICD-10-CM

## 2011-10-08 DIAGNOSIS — I10 Essential (primary) hypertension: Secondary | ICD-10-CM

## 2011-10-08 DIAGNOSIS — Z0000 Encounter for general adult medical examination without abnormal findings: Secondary | ICD-10-CM

## 2011-10-08 DIAGNOSIS — Z2911 Encounter for prophylactic immunotherapy for respiratory syncytial virus (RSV): Secondary | ICD-10-CM

## 2011-10-08 DIAGNOSIS — Z23 Encounter for immunization: Secondary | ICD-10-CM | POA: Insufficient documentation

## 2011-10-08 LAB — LIPID PANEL
Cholesterol: 143 mg/dL (ref 0–200)
LDL Cholesterol: 62 mg/dL (ref 0–99)

## 2011-10-08 LAB — HEMOGLOBIN A1C: Hgb A1c MFr Bld: 7.4 % — ABNORMAL HIGH (ref 4.6–6.5)

## 2011-10-08 LAB — COMPREHENSIVE METABOLIC PANEL
AST: 22 U/L (ref 0–37)
Alkaline Phosphatase: 55 U/L (ref 39–117)
BUN: 16 mg/dL (ref 6–23)
Creatinine, Ser: 0.7 mg/dL (ref 0.4–1.2)
Total Bilirubin: 0.9 mg/dL (ref 0.3–1.2)

## 2011-10-08 NOTE — Progress Notes (Signed)
Subjective:    Patient ID: Leslie Rowe, female    DOB: [birth year], 76 y.o.   MRN: [id number]  HPI The patient is here for annual Medicare wellness examination and management of other chronic and acute problems.  CC: for several days she has had a fleeting 2/10 pain in the right chest; she continues to have pain in the right shoulder - a chronic problem.    The risk factors are reflected in the social history.  The roster of all physicians providing medical care to patient - is listed in the Snapshot section of the chart.  Activities of daily living:  The patient is 100% inedpendent in all ADLs: dressing, toileting, feeding as well as independent mobility  Home safety : The patient has smoke detectors in the home. Fall - home is fall safe. They wear seatbelts. No firearms at home. There is no violence in the home.   There is no risks for hepatitis, STDs or HIV. There is no   history of blood transfusion. They have no travel history to infectious disease endemic areas of the world.  The patient has seen their dentist in the last 12 month. They have seen their eye doctor in the last year. They deny any hearing difficulty and have not had audiologic testing in the last year.    They do not  have excessive sun exposure. Discussed the need for sun protection: hats, long sleeves and use of sunscreen if there is significant sun exposure.   Diet: the importance of a healthy diet is discussed. They do have a healthy diet.  The patient has no regular exercise program.  The benefits of regular aerobic exercise were discussed.  Depression screen: there are no signs or vegative symptoms of depression- irritability, change in appetite, anhedonia, sadness/tearfullness.  Cognitive assessment: the patient manages all their financial and personal affairs and is actively engaged. Does have a little memory loss - does write stuff down.   The following portions of the patient's history were reviewed and  updated as appropriate: allergies, current medications, past family history, past medical history,  past surgical history, past social history  and problem list.  Vision, hearing, body mass index were assessed and reviewed.   During the course of the visit the patient was educated and counseled about appropriate screening and preventive services including : fall prevention , diabetes screening, nutrition counseling, colorectal cancer screening, and recommended immunizations.  Past Medical History  Diagnosis Date  . Diabetes mellitus     type II, mild  . Hyperlipidemia   . Hypertension     borderline   Past Surgical History  Procedure Date  . Abdominal hysterectomy     total   Family History  Problem Relation Age of Onset  . Hypertension Mother   . Other Mother     CVa  . Stroke Mother   . Cancer Brother     Bone  . Other Son     Autistic  . Hypertension Father   . Stroke Father   . Hypertension Sister   . Diabetes Brother    History   Social History  . Marital Status: Divorced    Spouse Name: N/A    Number of Children: 2  . Years of Education: 12   Occupational History  . 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 Smoking status: Former Smoker    Quit date: 10/08/1975  . Smokeless tobacco: Never Used  . Alcohol Use: No  .  Drug Use: No  . Sexually Active: Not Currently   Other Topics Concern  . Not on file   Social History Narrative   HSG. Married '60 - 56yrs/divorced. Married '80's - 35 yrs/divorced. 1 dtr - '70, 1 son - '64. 2 grandchildren. Retired - [employer]. Lives in own home, son is autistic and lives with her. Staying busy.    Current Outpatient Prescriptions on File Prior to Visit  Medication Sig Dispense Refill  . lisinopril-hydrochlorothiazide (PRINZIDE,ZESTORETIC) 20-12.5 MG per tablet take 1 tablet by mouth once daily  30 tablet  2  . metFORMIN (GLUCOPHAGE) 500 MG tablet take 1 tablet by mouth twice a day for diabetes  60 tablet   2  . multivitamin (THERAGRAN) tablet Take 1 tablet by mouth daily.        Marland Kitchen VYTORIN 10-40 MG per tablet take 1 tablet by mouth once daily  30 tablet  2   Current Facility-Administered Medications on File Prior to Visit  Medication Dose Route Frequency Provider Last Rate Last Dose  . pneumococcal 23 valent vaccine (PNU-IMMUNE) injection 0.5 mL  0.5 mL Intramuscular Once Jacques Navy, MD           Review of Systems Constitutional:  Negative for fever, chills, activity change and unexpected weight change.  HEENT:  Negative for hearing loss, ear pain, congestion, neck stiffness and postnasal drip. Negative for sore throat or swallowing problems. Negative for dental complaints.   Eyes: Negative for vision loss or change in visual acuity.  Respiratory: Negative for chest tightness and wheezing. Negative for DOE.   Cardiovascular: Negative for chest pain or palpitations. No decreased exercise tolerance Gastrointestinal: No change in bowel habit. No bloating or gas. No reflux or indigestion Genitourinary: positive for urgency. No frequency, flank pain and difficulty urinating.  Musculoskeletal: Negative for myalgias, back pain, arthralgias and gait problem.  Neurological: Negative for dizziness, tremors, weakness and headaches.  Hematological: Negative for adenopathy.  Psychiatric/Behavioral: Negative for behavioral problems and dysphoric mood.       Objective:   Physical Exam Filed Vitals:   10/08/11 1040  BP: 142/80  Pulse: 88  Temp: 97.8 F (36.6 C)  Resp: 16   Wt Readings from Last 3 Encounters:  10/08/11 171 lb (77.565 kg)  09/30/10 169 lb (76.658 kg)  07/17/09 176 lb (79.833 kg)   Gen'l: well nourished, well developed AA woman in no distress HEENT - [place]/AT, EACs/TMs normal, oropharynx with native dentition in good condition, no buccal or palatal lesions, posterior pharynx clear, mucous membranes moist. C&S clear, PERRLA, fundi - normal Neck - supple, no  thyromegaly Nodes- negative submental, cervical, supraclavicular regions Chest - no deformity, no CVAT Lungs - clear without rales, wheezes. No increased work of breathing Breast - - Skin normal, nipples w/o discharge, no fixed mass or lesion, no axillary adenopathy. Cardiovascular - regular rate and rhythm, quiet precordium, no murmurs, rubs or gallops, 2+ radial, DP and PT pulses Abdomen - BS+ x 4, no HSM, no guarding or rebound or tenderness Pelvic - deferred - s/p hysterectomy and age Rectal - deferred  Extremities - no clubbing, cyanosis, edema or deformity.  Neuro - A&O x 3, CN II-XII normal, motor strength normal and equal, DTRs 2+ and symmetrical biceps, radial, and patellar tendons. Cerebellar - no tremor, no rigidity, fluid movement and normal gait. Foot exam - normal sensation to light touch pin prick and deep vibration. Derm - Head, neck, back, abdomen and extremities without suspicious lesions  Lab Results  Component Value Date   WBC 5.5 07/17/2009   HGB 12.0 07/17/2009   HCT 37.3 07/17/2009   PLT 215.0 07/17/2009   GLUCOSE 103* 10/08/2011   CHOL 143 10/08/2011   TRIG 120.0 10/08/2011   HDL 56.90 10/08/2011   LDLCALC 62 10/08/2011   ALT 17 10/08/2011   AST 22 10/08/2011   NA 140 10/08/2011   K 4.0 10/08/2011   CL 101 10/08/2011   CREATININE 0.7 10/08/2011   BUN 16 10/08/2011   CO2 31 10/08/2011   TSH 1.11 10/08/2011   HGBA1C 7.4* 10/08/2011           Assessment & Plan:

## 2011-10-09 DIAGNOSIS — Z0000 Encounter for general adult medical examination without abnormal findings: Secondary | ICD-10-CM | POA: Insufficient documentation

## 2011-10-09 NOTE — Assessment & Plan Note (Signed)
BP Readings from Last 3 Encounters:  10/08/11 142/80  09/30/10 132/80  07/17/09 136/88   Adequate control but borderline at today's visit. Lab is normal re: electrolytes and kidney function  Plan Continue present medication

## 2011-10-09 NOTE — Assessment & Plan Note (Signed)
LDL is better than goal of 100 or less and HDL is better than goal of 40 or higher.  Plan Continue present medication.

## 2011-10-09 NOTE — Assessment & Plan Note (Signed)
A1C is not at goal of 7.4% but less than threshold for med change of 8%  Plan  Continue present medication  Better adherence to a diabetic diet and to regular exercise.   Repeat A1C 6 months-order entered for Feb '14

## 2011-10-09 NOTE — Assessment & Plan Note (Signed)
Interval history is w/o major illness, injury or surgery. Physical exam is normal. Lab results are in normal range. She is current with colorectal and breast cancer screening. Immunizations are up to date.   In summary -  A delightful woman who is young for her age, medically stable and doing well. She is encouraged to adhere to a diabetic diet, to exercise on a regular basis. She will return for lab in 6 months and for a repeat exam in 1 year.

## 2011-11-30 ENCOUNTER — Other Ambulatory Visit: Payer: Self-pay | Admitting: Internal Medicine

## 2011-12-06 ENCOUNTER — Other Ambulatory Visit: Payer: Self-pay | Admitting: Internal Medicine

## 2012-01-14 ENCOUNTER — Encounter: Payer: Self-pay | Admitting: Internal Medicine

## 2012-04-04 ENCOUNTER — Encounter: Payer: Self-pay | Admitting: Internal Medicine

## 2012-04-04 ENCOUNTER — Ambulatory Visit (INDEPENDENT_AMBULATORY_CARE_PROVIDER_SITE_OTHER): Payer: Medicare Other | Admitting: Internal Medicine

## 2012-04-04 ENCOUNTER — Other Ambulatory Visit (INDEPENDENT_AMBULATORY_CARE_PROVIDER_SITE_OTHER): Payer: Medicare Other

## 2012-04-04 VITALS — BP 128/84 | HR 85 | Temp 98.3°F | Resp 10 | Wt 171.8 lb

## 2012-04-04 DIAGNOSIS — E119 Type 2 diabetes mellitus without complications: Secondary | ICD-10-CM

## 2012-04-04 DIAGNOSIS — I10 Essential (primary) hypertension: Secondary | ICD-10-CM

## 2012-04-04 DIAGNOSIS — E785 Hyperlipidemia, unspecified: Secondary | ICD-10-CM

## 2012-04-04 LAB — COMPREHENSIVE METABOLIC PANEL
ALT: 16 U/L (ref 0–35)
Albumin: 4.1 g/dL (ref 3.5–5.2)
Alkaline Phosphatase: 61 U/L (ref 39–117)
CO2: 29 mEq/L (ref 19–32)
GFR: 95.5 mL/min (ref >60.00)
Glucose, Bld: 132 mg/dL — ABNORMAL HIGH (ref 70–99)
Potassium: 3.9 mEq/L (ref 3.5–5.1)
Sodium: 141 mEq/L (ref 135–145)
Total Protein: 6.9 g/dL (ref 6.0–8.3)

## 2012-04-04 NOTE — Patient Instructions (Addendum)
You are doing great. Will check labs today and you will get result either by mail or MyChart.  Please come back in 6 months for your annual exam.

## 2012-04-04 NOTE — Progress Notes (Signed)
Subjective:    Patient ID: Leslie Rowe, female    DOB: 05-14-1935, 77 y.o.   MRN: [id number]  HPI Leslie Rowe presents for medical follow up for DM, HTN. She was last seen in August - she has been doing well.   Past Medical History  Diagnosis Date  . Diabetes mellitus     type II, mild  . Hyperlipidemia   . Hypertension     borderline   Past Surgical History  Procedure Date  . Abdominal hysterectomy     total   Family History  Problem Relation Age of Onset  . Hypertension Mother   . Other Mother     CVa  . Stroke Mother   . Cancer Brother     Bone  . Other Son     Autistic  . Hypertension Father   . Stroke Father   . Hypertension Sister   . Diabetes Brother    History   Social History  . Marital Status: Divorced    Spouse Name: N/A    Number of Children: 2  . Years of Education: 12   Occupational History  . 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 Smoking status: Former Smoker    Quit date: 10/08/1975  . Smokeless tobacco: Never Used  . Alcohol Use: No  . Drug Use: No  . Sexually Active: Not Currently   Other Topics Concern  . Not on file   Social History Narrative   HSG. Married '60 - 41yrs/divorced. Married '80's - 35 yrs/divorced. 1 dtr - '70, 1 son - '64. 2 grandchildren. Retired - [employer]. Lives in own home, son is autistic and lives with her. Staying busy.     Current Outpatient Prescriptions on File Prior to Visit  Medication Sig Dispense Refill  . lisinopril-hydrochlorothiazide (PRINZIDE,ZESTORETIC) 20-12.5 MG per tablet take 1 tablet by mouth once daily  30 tablet  6  . metFORMIN (GLUCOPHAGE) 500 MG tablet take 1 tablet by mouth twice a day for diabetes  60 tablet  6  . multivitamin (THERAGRAN) tablet Take 1 tablet by mouth daily.        Marland Kitchen VYTORIN 10-40 MG per tablet take 1 tablet by mouth once daily  30 each  6   Current Facility-Administered Medications on File Prior to Visit  Medication Dose Route Frequency Provider  Last Rate Last Dose  . pneumococcal 23 valent vaccine (PNU-IMMUNE) injection 0.5 mL  0.5 mL Intramuscular Once Jacques Navy, MD          Review of Systems System review is negative for any constitutional, cardiac, pulmonary, GI or neuro symptoms or complaints other than as described in the HPI.     Objective:   Physical Exam Filed Vitals:   04/04/12 0948  BP: 128/84  Pulse: 85  Temp: 98.3 F (36.8 C)  Resp: 10   Wt Readings from Last 3 Encounters:  04/04/12 171 lb 12.8 oz (77.928 kg)  10/08/11 171 lb (77.565 kg)  09/30/10 169 lb (76.658 kg)   Gen'l- WNWD AA woman in no distress HEENT- C&S clear Cor- RRR Pulm - CTAP Neuro - normal foot exam: light touch, pin-prick and vibration  Lab Results  Component Value Date   WBC 5.5 07/17/2009   HGB 12.0 07/17/2009   HCT 37.3 07/17/2009   PLT 215.0 07/17/2009   GLUCOSE 132* 04/04/2012   CHOL 143 10/08/2011   TRIG 120.0 10/08/2011   HDL 56.90 10/08/2011   LDLCALC 62 10/08/2011  ALT 16 04/04/2012   AST 22 04/04/2012   NA 141 04/04/2012   K 3.9 04/04/2012   CL 105 04/04/2012   CREATININE 0.8 04/04/2012   BUN 18 04/04/2012   CO2 29 04/04/2012   TSH 1.11 10/08/2011   HGBA1C 7.6* 04/04/2012        Assessment & Plan:

## 2012-04-05 NOTE — Assessment & Plan Note (Signed)
Doing well. Too early for repeat lipid panel. No change in medication. Liver functions are normal

## 2012-04-05 NOTE — Assessment & Plan Note (Signed)
A1C 7.6% - a little higher than last reading.  Plan  Continue present dose metformin  Work on improving dietary compliance and exercise  Repeat A1C in 3 months (order entered)

## 2012-04-05 NOTE — Assessment & Plan Note (Signed)
BP Readings from Last 3 Encounters:  04/04/12 128/84  10/08/11 142/80  09/30/10 132/80   Good control on present medications - continue the same

## 2012-04-09 ENCOUNTER — Encounter: Payer: Self-pay | Admitting: Internal Medicine

## 2012-05-25 ENCOUNTER — Encounter: Payer: Self-pay | Admitting: Internal Medicine

## 2012-07-01 ENCOUNTER — Other Ambulatory Visit: Payer: Self-pay | Admitting: Internal Medicine

## 2012-07-03 ENCOUNTER — Other Ambulatory Visit: Payer: Self-pay

## 2012-07-03 MED ORDER — METFORMIN HCL 500 MG PO TABS: 500.0000 mg | ORAL_TABLET | Freq: Two times a day (BID) | ORAL | Status: DC

## 2012-10-26 ENCOUNTER — Encounter: Payer: Medicare Other | Admitting: Internal Medicine

## 2012-12-06 ENCOUNTER — Other Ambulatory Visit (INDEPENDENT_AMBULATORY_CARE_PROVIDER_SITE_OTHER): Payer: Medicare Other

## 2012-12-06 ENCOUNTER — Encounter: Payer: Self-pay | Admitting: Internal Medicine

## 2012-12-06 ENCOUNTER — Ambulatory Visit (INDEPENDENT_AMBULATORY_CARE_PROVIDER_SITE_OTHER): Payer: Medicare Other | Admitting: Internal Medicine

## 2012-12-06 VITALS — BP 160/96 | HR 78 | Temp 96.3°F | Ht 66.0 in | Wt 170.8 lb

## 2012-12-06 DIAGNOSIS — E119 Type 2 diabetes mellitus without complications: Secondary | ICD-10-CM

## 2012-12-06 DIAGNOSIS — I10 Essential (primary) hypertension: Secondary | ICD-10-CM

## 2012-12-06 DIAGNOSIS — E785 Hyperlipidemia, unspecified: Secondary | ICD-10-CM

## 2012-12-06 DIAGNOSIS — Z23 Encounter for immunization: Secondary | ICD-10-CM

## 2012-12-06 DIAGNOSIS — R413 Other amnesia: Secondary | ICD-10-CM | POA: Insufficient documentation

## 2012-12-06 DIAGNOSIS — Z0000 Encounter for general adult medical examination without abnormal findings: Secondary | ICD-10-CM

## 2012-12-06 LAB — LIPID PANEL
Cholesterol: 165 mg/dL (ref 0–200)
HDL: 51.4 mg/dL (ref >39.00)
LDL Cholesterol: 100 mg/dL — ABNORMAL HIGH (ref 0–99)
VLDL: 13.4 mg/dL (ref 0.0–40.0)

## 2012-12-06 LAB — COMPREHENSIVE METABOLIC PANEL
AST: 16 U/L (ref 0–37)
Albumin: 4.2 g/dL (ref 3.5–5.2)
Alkaline Phosphatase: 57 U/L (ref 39–117)
BUN: 15 mg/dL (ref 6–23)
Creatinine, Ser: 0.7 mg/dL (ref 0.4–1.2)
Glucose, Bld: 129 mg/dL — ABNORMAL HIGH (ref 70–99)
Potassium: 4 mEq/L (ref 3.5–5.1)
Total Bilirubin: 0.7 mg/dL (ref 0.3–1.2)

## 2012-12-06 LAB — HEPATIC FUNCTION PANEL
AST: 17 U/L (ref 0–37)
Albumin: 4.2 g/dL (ref 3.5–5.2)
Bilirubin, Direct: 0.1 mg/dL (ref 0.0–0.3)
Total Bilirubin: 0.8 mg/dL (ref 0.3–1.2)

## 2012-12-06 NOTE — Assessment & Plan Note (Addendum)
Takes blood pressure at home--usually in 120s/80s. Highest is about 160/90. No HA. Most of the time it's higher at the doctor's office than at home. Awakening at night about 1x per night on most nights, but takes her pills in the morning. No urgency. No muscle aches.   Plan Will d/c lisinopril-HCTZ and start lisinopril 20 mg. Add  Furosemide 20 mg.    Will check CMP today to check for any electrolyte abnormalities.

## 2012-12-06 NOTE — Patient Instructions (Signed)
Thanks for working with Leslie Millet - a blooming physician  Things seem to be great.   Routine lab today with results posted to MyChart or by written report.  No change in medication.  Return as needed.

## 2012-12-06 NOTE — Progress Notes (Signed)
Subjective:     Patient ID: Leslie Rowe, female   DOB: February 22, 1936, 77 y.o.   MRN: [id number]  HPI The patient is here for annual Medicare wellness examination and management of other chronic and acute problems, including HTN, diabetes, and hyperlipidemia.    The risk factors are reflected in the social history.  The roster of all physicians providing medical care to patient - is listed in the Snapshot section of the chart.  Activities of daily living:  The patient is 100% inedpendent in all ADLs: dressing, toileting, feeding as well as independent mobility. Lives with 61 year old austistic son at home, and she is his primary caregiver. [facility] and has some help for respite. Thinks things are going ok.   Home safety : The patient has smoke detectors in the home. They wear seatbelts. No firearms at home. There is no violence in the home.   There is no risks for hepatitis, STDs or HIV. There is no history of blood transfusion. They have no travel history to infectious disease endemic areas of the world.  The patient has seen their dentist in the last year. They have seen their eye doctor in the last year--has cataracts, but the eye doctor is following this now with no plans for surgery yet.  They admit to some hearing difficulty and have not had audiologic testing in the last year. Eventually will probably see an audiologist, but is not interested in doing that now. Worked at [employer] with a lot of machine noise. They do not  have excessive sun exposure. Discussed the need for sun protection: hats, long sleeves and use of sunscreen if there is significant sun exposure.   Diet: the importance of a healthy diet is discussed. They do have a healthy diet. Lost 15 lbs a few years ago, and weight is stable now.   The patient does not have a regular exercise program. She walks for exercise irregularly. [facility] is [facility]. The benefits of regular aerobic  exercise were discussed.  Depression screen: there are no signs or vegative symptoms of depression- irritability, change in appetite, anhedonia, sadness/tearfullness.  Cognitive assessment: the patient manages all their financial and personal affairs and is actively engaged. They could relate day,date,year and events; recalled 2/3 objects at 3 minutes; performed clock-face test normally. Patient has noticed some "little" changes in her memory.   The following portions of the patient's history were reviewed and updated as appropriate: allergies, current medications, past family history, past medical history,  past surgical history, past social history  and problem list.  Vision, hearing, body mass index were assessed and reviewed.   During the course of the visit the patient was educated and counseled about appropriate screening and preventive services including : fall prevention, nutrition counseling, colorectal cancer screening, and recommended immunizations. No falls in the past year and no concerns about falling. Her last colonoscopy was in 2008, and will have another next year. Most recent mammogram was in January of this year and was normal.   Past Medical History  Diagnosis Date  . Diabetes mellitus     type II, mild  . Hyperlipidemia   . Hypertension     borderline     Review of Systems Constitutional:  Negative for fever, chills, activity change and unexpected weight change.  HEENT:  Negative for ear pain, congestion, neck stiffness and postnasal drip. Negative for sore throat or swallowing problems. Negative for  dental complaints.  Some mild hearing loss bilterally--worse on left side.  Eyes: Negative for vision loss or change in visual acuity. Cataracts per opthamologist, but no changes in vision yet.  Respiratory: Negative for chest tightness and wheezing. Negative for DOE.   Cardiovascular: Negative for chest pain or palpitations. No decreased exercise tolerance.   Gastrointestinal: No change in bowel habit. No bloating or gas. No reflux or indigestion.  Genitourinary: Negative for urgency, frequency, flank pain and difficulty urinating.  Musculoskeletal: Negative for myalgias, back pain, arthralgias and gait problem.  Neurological: Negative for dizziness, tremors, weakness and headaches.  Hematological: Negative for adenopathy.  Psychiatric/Behavioral: Negative for behavioral problems and dysphoric mood.     Objective:   Physical Exam Filed Vitals:   12/06/12 0901  BP: 160/96  Pulse: 78  Temp: 96.3 F (35.7 C)   Wt Readings from Last 3 Encounters:  12/06/12 170 lb 12.8 oz (77.474 kg)  04/04/12 171 lb 12.8 oz (77.928 kg)  10/08/11 171 lb (77.565 kg)   Gen'l: well nourished, well developed, woman in no distress HEENT - [place]/AT, EACs/TMs normal, oropharynx with native dentition in good condition, no buccal or palatal lesions, posterior pharynx clear, mucous membranes moist. C&S clear, PERRLA. Neck - supple, no thyromegaly Nodes- negative submental, cervical, supraclavicular regions Chest - no deformity, no CVAT Lungs - cleat without rales, wheezes. No increased work of breathing.  Breast - Bilateral, normal fibrocystic changes.  Cardiovascular - regular rate and rhythm, quiet precordium, no murmurs, rubs or gallops, 2+ radial, DP and PT pulses. Abdomen - BS+ x 4, no HSM, no guarding or rebound or tenderness Pelvic - Hx of hysterectomy, no longer seeing gyn.  Extremities - no clubbing, cyanosis, edema or deformity.  Neuro - A&O x 3, CN II-XII normal--Unable to hear finger rub outside of ears bilaterally, but able to carry on conversation at normal conversational volume, motor strength normal and equal, DTRs 2+ and symmetrical biceps and patellar tendons. Cerebellar - no tremor, no rigidity, fluid movement and normal gait. Symmetric sharp-dull discrimination on bilateral lower extremities.  MSE: A&Ox3.2/3 word recall, normal clock draw. Some  spatial reasoning defects in drawing a 3D box. Declined serial 7s. Was able to spell WORLD backwards. Judgement intact. Attention intact. Naming objects intact.  Derm - Head, neck, abdomen and extremities without suspicious lesions     Assessment:     Ms. Garn is a 77 year old woman with a history of well-controlled HTN, hyperlipidemia, and diabetes who presents for management of her chronic problems, some mild memory deficits, hearing loss, and need for flu vaccine.     Plan:     See plan by problem list.

## 2012-12-06 NOTE — Assessment & Plan Note (Addendum)
Reports episodes of "jitteriness", thinks she may have low blood sugar then. Will drink orange juice and the feeling goes away. No dizziness or changes in vision.   Continue current plan of care. Check A1c today.   Lab Results  Component Value Date   HGBA1C 7.7* 12/06/2012   Plan Increase metformin to 1,000 mg bid

## 2012-12-06 NOTE — Assessment & Plan Note (Addendum)
Interval medical history is unremarkable. Physical exam is normal. She is current with colorectal and breast cancer screening. Immunizations are up to date. Flu shot today. ACP: Discussed need for advanced care planning with patient and provided information and advanced directive forms. Referred to The [email or web address] Encouraged patient to discuss this with family and designate a healthcare power of attorney.

## 2012-12-06 NOTE — Assessment & Plan Note (Addendum)
Take vytorin irregularly--takes it more than not, more than every other day. Needs refill. Wonders if she needs to take it every day given her excellent lipids. Reports no side effects, but it is expensive, about 98$ a month. Recalls being on another medicine for cholesterol, but doesn't remember. Would prefer staying with vytorin even if it costs more. No N/V/D.   Will check lipids and LFTs today, as well as CMP.   Addendum:  LDL at goal of 100 or less; HDL better than goal of 40+; LFTs normal

## 2012-12-06 NOTE — Assessment & Plan Note (Addendum)
Minimal memory change - noted with word recall on MMSE.  Plan Will check serum B12 today    follow-up MSE in 6 months.

## 2012-12-07 MED ORDER — FUROSEMIDE 20 MG PO TABS: 20.0000 mg | ORAL_TABLET | Freq: Every day | ORAL | Status: DC

## 2012-12-07 MED ORDER — METFORMIN HCL 1000 MG PO TABS: 1000.0000 mg | ORAL_TABLET | Freq: Two times a day (BID) | ORAL | Status: DC

## 2013-02-02 ENCOUNTER — Other Ambulatory Visit: Payer: Self-pay | Admitting: Internal Medicine

## 2013-02-06 ENCOUNTER — Other Ambulatory Visit: Payer: Self-pay | Admitting: Internal Medicine

## 2013-03-08 ENCOUNTER — Encounter: Payer: Self-pay | Admitting: Internal Medicine

## 2013-04-04 ENCOUNTER — Other Ambulatory Visit: Payer: Self-pay | Admitting: Internal Medicine

## 2013-05-25 ENCOUNTER — Encounter: Payer: Self-pay | Admitting: Internal Medicine

## 2013-06-26 ENCOUNTER — Other Ambulatory Visit: Payer: Self-pay | Admitting: *Deleted

## 2013-06-26 MED ORDER — METFORMIN HCL 1000 MG PO TABS: 1000.0000 mg | ORAL_TABLET | Freq: Two times a day (BID) | ORAL | Status: DC

## 2013-08-08 ENCOUNTER — Telehealth: Payer: Self-pay | Admitting: Internal Medicine

## 2013-08-08 ENCOUNTER — Ambulatory Visit (INDEPENDENT_AMBULATORY_CARE_PROVIDER_SITE_OTHER): Payer: Medicare Other | Admitting: Internal Medicine

## 2013-08-08 ENCOUNTER — Encounter: Payer: Self-pay | Admitting: Internal Medicine

## 2013-08-08 VITALS — BP 146/90 | HR 89 | Temp 98.3°F | Wt 167.6 lb

## 2013-08-08 DIAGNOSIS — R1013 Epigastric pain: Secondary | ICD-10-CM

## 2013-08-08 DIAGNOSIS — IMO0001 Reserved for inherently not codable concepts without codable children: Secondary | ICD-10-CM

## 2013-08-08 DIAGNOSIS — I10 Essential (primary) hypertension: Secondary | ICD-10-CM

## 2013-08-08 DIAGNOSIS — R490 Dysphonia: Secondary | ICD-10-CM

## 2013-08-08 DIAGNOSIS — K3189 Other diseases of stomach and duodenum: Secondary | ICD-10-CM

## 2013-08-08 MED ORDER — RANITIDINE HCL 150 MG PO TABS: 150.0000 mg | ORAL_TABLET | Freq: Two times a day (BID) | ORAL | Status: DC

## 2013-08-08 NOTE — Progress Notes (Signed)
Pre visit review using our clinic review tool, if applicable. No additional management support is needed unless otherwise documented below in the visit note. 

## 2013-08-08 NOTE — Patient Instructions (Signed)
Reflux of gastric acid may be asymptomatic as this may occur mainly during sleep.The triggers for reflux  include stress; the "aspirin family" ; alcohol; peppermint; and caffeine (coffee, tea, cola, and chocolate). The aspirin family would include aspirin and the nonsteroidal agents such as ibuprofen &  Naproxen. Tylenol would not cause reflux. If having symptoms ; food & drink should be avoided for @ least 2 hours before going to bed. Take the ranitidine 30 minutes before breakfast and evening meal. If this does not control reflux symptoms over the next 5 days;start omeprazole 20 mg  (prilosec OTC)  30 minutes before breakfast in place of the ranitidine.

## 2013-08-08 NOTE — Progress Notes (Signed)
Subjective:    Patient ID: Leslie Rowe, female    DOB: [birth year], 78 y.o.   MRN: [id number]  HPI   She's had hoarseness for approximately 3 weeks without specific trigger. This has been worse in the morning. She hs also had this in the past. It was attributed to allergies in the past and responded to antihistamines. She took Claritin-D recently without benefit  She's been having indigestion after meals and a sensation that she needs to burp.  She did take Pepto-Bismol which caused dark stool.  Cough is not a significant issue  At times stools can be loose  without frank diarrhea      Review of Systems   She does denies significant extrinsic symptoms of itchy, watery eyes, sneezing  She has had no angioedema symptoms of swelling of the lips or tongue. She has a history of whitecoat hypertension with elevation of BP only @ doctor visits. She has brought her cuff in the past to verify this.      Objective:   Physical Exam  Significant or distinguishing  findings on physical exam are documented first.  Below that are other systems examined & findings.  She has upper and lower partials. There is a grade 1 systolic murmur at the base. Pedal pulses are slightly decreased but equal. The remainder the exam is unremarkable.  Gen.: Healthy and well-nourished in appearance. Alert, appropriate and cooperative throughout exam. Appears younger than stated age  Head: Normocephalic without obvious abnormalitiesEyes: No corneal or conjunctival inflammation noted. Pupils equal round reactive to light and accommodation. Extraocular motion intact.   Ears: External  ear exam reveals no significant lesions or deformities. Canals clear .TMs normal. Hearing is grossly normal bilaterally. Nose: External nasal exam reveals no deformity or inflammation. Nasal mucosa are pink and moist. No lesions or exudates noted.   Mouth: Oral mucosa and oropharynx reveal no lesions or exudates.   Neck:  No deformities, masses, or tenderness noted. Range of motion& Thyroid normal Lungs: Normal respiratory effort; chest expands symmetrically. Lungs are clear to auscultation without rales, wheezes, or increased work of breathing. Heart: Normal rate and rhythm. Normal S1 and S2. No gallop, click, or rub.  Abdomen: Bowel sounds normal; abdomen soft and nontender. No masses, organomegaly or hernias noted.                        Musculoskeletal/extremities: No deformity or scoliosis noted of  the thoracic or lumbar spine.  No clubbing, cyanosis, edema, or significant extremity  deformity noted. Range of motion normal .Tone & strength normal. Hand joints normal  Fingernail health good. Able to lie down & sit up w/o help. Negative SLR bilaterally Vascular: Carotid, radial artery, dorsalis pedis and  posterior tibial pulses are  equal. No bruits present. Neurologic: Alert and oriented x3 (Note : PMH of some memory deficit). Deep tendon reflexes symmetrical and normal.  Gait normal         Skin: Intact without suspicious lesions or rashes. Lymph: No cervical, axillary lymphadenopathy present. Psych: Mood and affect are normal. Normally interactive  Assessment & Plan:  #1 hoarseness #2 dyspepsia #3 white coat HTN See orders

## 2013-08-08 NOTE — Telephone Encounter (Signed)
Relevant patient education assigned to patient using Emmi. ° °

## 2013-09-04 ENCOUNTER — Other Ambulatory Visit: Payer: Self-pay

## 2013-09-04 MED ORDER — LISINOPRIL-HYDROCHLOROTHIAZIDE 20-12.5 MG PO TABS: 1.0000 | ORAL_TABLET | Freq: Every day | ORAL | Status: DC

## 2013-11-02 ENCOUNTER — Other Ambulatory Visit: Payer: Self-pay | Admitting: Internal Medicine

## 2013-11-12 ENCOUNTER — Encounter: Payer: Self-pay | Admitting: Internal Medicine

## 2013-11-12 ENCOUNTER — Other Ambulatory Visit (INDEPENDENT_AMBULATORY_CARE_PROVIDER_SITE_OTHER): Payer: Medicare Other

## 2013-11-12 ENCOUNTER — Ambulatory Visit (INDEPENDENT_AMBULATORY_CARE_PROVIDER_SITE_OTHER): Payer: Medicare Other | Admitting: Internal Medicine

## 2013-11-12 VITALS — BP 142/82 | HR 86 | Temp 98.1°F | Resp 12 | Ht 66.5 in | Wt 166.0 lb

## 2013-11-12 DIAGNOSIS — E785 Hyperlipidemia, unspecified: Secondary | ICD-10-CM

## 2013-11-12 DIAGNOSIS — I10 Essential (primary) hypertension: Secondary | ICD-10-CM

## 2013-11-12 DIAGNOSIS — E119 Type 2 diabetes mellitus without complications: Secondary | ICD-10-CM

## 2013-11-12 DIAGNOSIS — K219 Gastro-esophageal reflux disease without esophagitis: Secondary | ICD-10-CM

## 2013-11-12 LAB — LIPID PANEL
CHOLESTEROL: 278 mg/dL — AB (ref 0–200)
HDL: 46.1 mg/dL (ref >39.00)
LDL Cholesterol: 204 mg/dL — ABNORMAL HIGH (ref 0–99)
NonHDL: 231.9
TRIGLYCERIDES: 140 mg/dL (ref 0.0–149.0)
Total CHOL/HDL Ratio: 6
VLDL: 28 mg/dL (ref 0.0–40.0)

## 2013-11-12 LAB — BASIC METABOLIC PANEL
BUN: 19 mg/dL (ref 6–23)
CHLORIDE: 102 meq/L (ref 96–112)
CO2: 30 meq/L (ref 19–32)
CREATININE: 0.8 mg/dL (ref 0.4–1.2)
Calcium: 9.7 mg/dL (ref 8.4–10.5)
GFR: 96.56 mL/min (ref >60.00)
GLUCOSE: 126 mg/dL — AB (ref 70–99)
Potassium: 4 mEq/L (ref 3.5–5.1)
Sodium: 140 mEq/L (ref 135–145)

## 2013-11-12 LAB — HEMOGLOBIN A1C: Hgb A1c MFr Bld: 7.3 % — ABNORMAL HIGH (ref 4.6–6.5)

## 2013-11-12 MED ORDER — PANTOPRAZOLE SODIUM 40 MG PO TBEC: 40.0000 mg | DELAYED_RELEASE_TABLET | Freq: Every day | ORAL | Status: DC

## 2013-11-12 NOTE — Progress Notes (Signed)
Pre visit review using our clinic review tool, if applicable. No additional management support is needed unless otherwise documented below in the visit note. 

## 2013-11-12 NOTE — Assessment & Plan Note (Signed)
Patient off vytorin for some time now and will check lipid panel today and change medicines as needed. LDL goal <100 or <70 given her diabetes.

## 2013-11-12 NOTE — Patient Instructions (Signed)
We will have you stop taking the zantac and starting using a medicine called protonix. It is a little stronger for acid and you only need to take it once per day.   We will check your cholesterol and sugars today and call you with the results.   Come back in about 6-12 months. Call us sooner if you are sick or have problems.   Remember to get your flu shot in the next month or two before the flu season comes.

## 2013-11-12 NOTE — Assessment & Plan Note (Addendum)
Currently taking metformin 1000 mg bid and would set goal HgA1c at less than 8. She does not have any complications at this time. Checking HgA1c today. Recent eye exam done per patient. Foot exam performed today. On ACE-I.

## 2013-11-12 NOTE — Assessment & Plan Note (Signed)
BP initially high but came down with rest. Will continue on her current regimen although unclear that the lasix is likely doing much for her BP. She will continue on lasix 20 mg daily, lisinopril/HCTZ 20/12.5 mg daily. Will check BMP.

## 2013-11-12 NOTE — Progress Notes (Signed)
   Subjective:    Patient ID: Leslie Rowe, female    DOB: 09/08/1935, 78 y.o.   MRN: [id number]  HPI The patient is a 78 YO female who is coming in to establish care. She has PMH of DM type 2 controlled, HTN, hyperlipidemia. She is doing well and has no new complaints at this time. She is taking ranitidine for some acid reflux which is worse in the morning and she is not sure that it is helping enough. She denies chest pains, SOB, abdominal pain, diarrhea, constipation. She has stopped taking vytorin and wants to know if she should still need to be on it. She would like to wait a month or so on the flu shot as she thinks it is too early. 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 She also has a daughter in town and twin grandchildren. She denies any numbness or tingling in her feet and she does try to exercise some but knows she should do better. She does see her eye doctor regularly and he checks her for signs of diabetes in the eyes.   Review of Systems  Constitutional: Negative for fever, activity change, appetite change and fatigue.  HENT: Negative.   Respiratory: Negative for cough, chest tightness, shortness of breath and wheezing.   Cardiovascular: Negative for chest pain, palpitations and leg swelling.  Gastrointestinal: Negative for abdominal pain, diarrhea and constipation.  Endocrine: Negative.   Genitourinary: Negative.   Musculoskeletal: Negative for arthralgias and gait problem.  Skin: Negative.   Neurological: Negative for dizziness, weakness, light-headedness and headaches.       Objective:   Physical Exam  Constitutional: She appears well-developed and well-nourished. No distress.  HENT:  Head: Normocephalic and atraumatic.  Eyes: EOM are normal.  Neck: Normal range of motion. No JVD present. No thyromegaly present.  Cardiovascular: Normal rate and regular rhythm.   Faint systolic murmur 2/6 intensity.  Pulmonary/Chest: Effort normal and breath  sounds normal. No respiratory distress. She has no wheezes. She has no rales.  Abdominal: Soft. Bowel sounds are normal. She exhibits no distension. There is no tenderness. There is no rebound.  Skin: Skin is warm and dry.  See foot exam.   Filed Vitals:   11/12/13 1102 11/12/13 1116  BP: 162/88 142/82  Pulse: 86   Temp: 98.1 F (36.7 C)   TempSrc: Oral   Resp: 12   Height: 5' 6.5" (1.689 m)   Weight: 166 lb 0.6 oz (75.315 kg)   SpO2: 99%       Assessment & Plan:

## 2013-11-15 MED ORDER — SIMVASTATIN 40 MG PO TABS: 40.0000 mg | ORAL_TABLET | Freq: Every day | ORAL | Status: DC

## 2013-11-15 NOTE — Addendum Note (Signed)
Addended by: Vertell Novak A on: 11/15/2013 01:34 PM   Modules accepted: Orders, Medications

## 2013-11-22 ENCOUNTER — Other Ambulatory Visit: Payer: Self-pay | Admitting: Geriatric Medicine

## 2013-11-22 MED ORDER — FUROSEMIDE 20 MG PO TABS: ORAL_TABLET | ORAL | Status: DC

## 2013-12-12 ENCOUNTER — Telehealth: Payer: Self-pay | Admitting: Internal Medicine

## 2013-12-12 ENCOUNTER — Telehealth: Payer: Self-pay | Admitting: *Deleted

## 2013-12-12 MED ORDER — SIMVASTATIN 40 MG PO TABS: 40.0000 mg | ORAL_TABLET | Freq: Every day | ORAL | Status: DC

## 2013-12-12 MED ORDER — LISINOPRIL-HYDROCHLOROTHIAZIDE 20-12.5 MG PO TABS: ORAL_TABLET | ORAL | Status: DC

## 2013-12-12 MED ORDER — METFORMIN HCL 1000 MG PO TABS: 1000.0000 mg | ORAL_TABLET | Freq: Two times a day (BID) | ORAL | Status: DC

## 2013-12-12 NOTE — Telephone Encounter (Signed)
[place] called stated Leslie Rowe recommended for her to get Simvastatin, Metformin and Lisinopril 90 days supply to be send into [facility] on [address]. Please advise

## 2013-12-12 NOTE — Telephone Encounter (Signed)
Sent refills on scripts...Leslie Rowe

## 2013-12-13 ENCOUNTER — Other Ambulatory Visit: Payer: Self-pay | Admitting: Geriatric Medicine

## 2013-12-13 MED ORDER — METFORMIN HCL 1000 MG PO TABS: 1000.0000 mg | ORAL_TABLET | Freq: Two times a day (BID) | ORAL | Status: DC

## 2013-12-13 MED ORDER — LISINOPRIL-HYDROCHLOROTHIAZIDE 20-12.5 MG PO TABS: ORAL_TABLET | ORAL | Status: DC

## 2013-12-13 MED ORDER — SIMVASTATIN 40 MG PO TABS: 40.0000 mg | ORAL_TABLET | Freq: Every day | ORAL | Status: DC

## 2013-12-14 ENCOUNTER — Ambulatory Visit (INDEPENDENT_AMBULATORY_CARE_PROVIDER_SITE_OTHER): Payer: Medicare Other

## 2013-12-14 DIAGNOSIS — Z23 Encounter for immunization: Secondary | ICD-10-CM

## 2013-12-18 ENCOUNTER — Encounter: Payer: Self-pay | Admitting: Internal Medicine

## 2013-12-21 NOTE — Telephone Encounter (Signed)
Please advise her to keep taking her metformin and to drink plenty of fluids. If she starts having nausea, vomiting, abdominal pain to come in or seek medical care. Otherwise observe.

## 2013-12-21 NOTE — Telephone Encounter (Signed)
Please advise, thanks.

## 2013-12-21 NOTE — Telephone Encounter (Signed)
Patient stated that B/S has been over 200. Please on what to do.

## 2013-12-24 NOTE — Telephone Encounter (Signed)
I spoke with patient and it is normal now. She said she drank cranberry juice and she thinks that's what caused it. She will drink fluids and take her medication. She will also keep an eye on her blood sugar levels and call us if it happens again.

## 2014-02-11 ENCOUNTER — Encounter: Payer: Self-pay | Admitting: Internal Medicine

## 2014-03-07 ENCOUNTER — Telehealth: Payer: Self-pay | Admitting: Internal Medicine

## 2014-03-15 ENCOUNTER — Other Ambulatory Visit: Payer: Self-pay | Admitting: Geriatric Medicine

## 2014-03-15 DIAGNOSIS — K219 Gastro-esophageal reflux disease without esophagitis: Secondary | ICD-10-CM

## 2014-03-15 MED ORDER — PANTOPRAZOLE SODIUM 40 MG PO TBEC: 40.0000 mg | DELAYED_RELEASE_TABLET | Freq: Every day | ORAL | Status: DC

## 2014-03-15 NOTE — Telephone Encounter (Signed)
Sent to pharmacy 

## 2014-03-15 NOTE — Telephone Encounter (Signed)
Patient has called back in regards to getting this filled.

## 2014-04-15 ENCOUNTER — Telehealth: Payer: Self-pay | Admitting: Internal Medicine

## 2014-04-15 NOTE — Telephone Encounter (Signed)
I spoke with patient and she is no longer having reflux pain. She says she is now having abdominal cramps. She said she read online that one of the side effects from protonix is stomach cramping. She has been taking is since November of last year. She stopped protonix 3 days ago and the cramping is not as bad. She is going to schedule an office visit to be seen just to make sure everything is ok.

## 2014-04-15 NOTE — Telephone Encounter (Signed)
Patient stating that she has been taking  pantoprazole (PROTONIX) 40 MG tablet [[id number]] but she is still having pains in her stomach. She is concerned that the RX is not working.

## 2014-04-16 ENCOUNTER — Other Ambulatory Visit (INDEPENDENT_AMBULATORY_CARE_PROVIDER_SITE_OTHER): Payer: Medicare Other

## 2014-04-16 ENCOUNTER — Encounter: Payer: Self-pay | Admitting: Internal Medicine

## 2014-04-16 ENCOUNTER — Ambulatory Visit (INDEPENDENT_AMBULATORY_CARE_PROVIDER_SITE_OTHER): Payer: Medicare Other | Admitting: Internal Medicine

## 2014-04-16 VITALS — BP 148/84 | HR 94 | Temp 98.3°F | Resp 14 | Ht 66.0 in | Wt 165.0 lb

## 2014-04-16 DIAGNOSIS — K219 Gastro-esophageal reflux disease without esophagitis: Secondary | ICD-10-CM | POA: Insufficient documentation

## 2014-04-16 DIAGNOSIS — R109 Unspecified abdominal pain: Secondary | ICD-10-CM

## 2014-04-16 LAB — COMPREHENSIVE METABOLIC PANEL
ALT: 10 U/L (ref 0–35)
AST: 15 U/L (ref 0–37)
Albumin: 4.2 g/dL (ref 3.5–5.2)
Alkaline Phosphatase: 65 U/L (ref 39–117)
BUN: 19 mg/dL (ref 6–23)
CALCIUM: 9.8 mg/dL (ref 8.4–10.5)
CHLORIDE: 102 meq/L (ref 96–112)
CO2: 30 mEq/L (ref 19–32)
Creatinine, Ser: 0.79 mg/dL (ref 0.40–1.20)
GFR: 90.83 mL/min (ref >60.00)
GLUCOSE: 203 mg/dL — AB (ref 70–99)
Potassium: 3.7 mEq/L (ref 3.5–5.1)
Sodium: 139 mEq/L (ref 135–145)
Total Bilirubin: 0.4 mg/dL (ref 0.2–1.2)
Total Protein: 7.4 g/dL (ref 6.0–8.3)

## 2014-04-16 NOTE — Progress Notes (Signed)
Pre visit review using our clinic review tool, if applicable. No additional management support is needed unless otherwise documented below in the visit note. 

## 2014-04-16 NOTE — Patient Instructions (Signed)
We will have you stay off the protonix (stomach medicine). We will give it a week or so to get out of your system and see if you are still having the stomach cramps.   We will check on the kidneys, liver, electrolytes today to make sure we are not missing anything. If you are having problems with the heartburn you can take a zantac or a pepcid over the counter. These are less strong on the stomach and you use them just when you need to.   Call us back if you are having any problems or questions.   Food Choices for Gastroesophageal Reflux Disease When you have gastroesophageal reflux disease (GERD), the foods you eat and your eating habits are very important. Choosing the right foods can help ease the discomfort of GERD. WHAT GENERAL GUIDELINES DO I NEED TO FOLLOW?  Choose fruits, vegetables, whole grains, low-fat dairy products, and low-fat meat, fish, and poultry.  Limit fats such as oils, salad dressings, butter, nuts, and avocado.  Keep a food diary to identify foods that cause symptoms.  Avoid foods that cause reflux. These may be different for different people.  Eat frequent small meals instead of three large meals each day.  Eat your meals slowly, in a relaxed setting.  Limit fried foods.  Cook foods using methods other than frying.  Avoid drinking alcohol.  Avoid drinking large amounts of liquids with your meals.  Avoid bending over or lying down until 2-3 hours after eating. WHAT FOODS ARE NOT RECOMMENDED? The following are some foods and drinks that may worsen your symptoms: Vegetables Tomatoes. Tomato juice. Tomato and spaghetti sauce. Chili peppers. Onion and garlic. Horseradish. Fruits Oranges, grapefruit, and lemon (fruit and juice). Meats High-fat meats, fish, and poultry. This includes hot dogs, ribs, ham, sausage, salami, and bacon. Dairy Whole milk and chocolate milk. Sour cream. Cream. Butter. Ice cream. Cream cheese.  Beverages Coffee and tea, with or  without caffeine. Carbonated beverages or energy drinks. Condiments Hot sauce. Barbecue sauce.  Sweets/Desserts Chocolate and cocoa. Donuts. Peppermint and spearmint. Fats and Oils High-fat foods, including [place] fries and potato chips. Other Vinegar. Strong spices, such as black pepper, white pepper, red pepper, cayenne, curry powder, cloves, ginger, and chili powder. The items listed above may not be a complete list of foods and beverages to avoid. Contact your dietitian for more information. Document Released: 02/15/2005 Document Revised: 02/20/2013 Document Reviewed: 12/20/2012 [facility] Patient Information 2015 [place], [place]. This information is not intended to replace advice given to you by your health care provider. Make sure you discuss any questions you have with your health care provider.

## 2014-04-16 NOTE — Assessment & Plan Note (Signed)
Has been doing okay off the protonix. Will keep off and monitor symptoms. For the discomfort will see if it gets better. Check CMP today.

## 2014-04-16 NOTE — Progress Notes (Signed)
   Subjective:    Patient ID: Leslie Rowe, female    DOB: 12-23-1935, 79 y.o.   MRN: [id number]  HPI The patient is a 79 YO female who is coming in for stomach discomfort. She noticed it first about 1 week ago. She then looked up the side effects from her medicine for her stomach (protonix) and noticed that her symptoms were listed. She stopped taking it about Friday. Her stomach symptoms have improved since that time although they are not gone all the way. She denies associated nausea. She does have relief of symptoms with bowel movement. She denies constipation or diarrhea. She has not tried anything over the counter for the pain. The discomfort lasts for about 10-15 minutes when it comes and maybe comes once a day now. Originally was coming several times per day.   Review of Systems  Constitutional: Negative for fever, activity change, appetite change and fatigue.  HENT: Negative.   Respiratory: Negative for cough, chest tightness, shortness of breath and wheezing.   Cardiovascular: Negative for chest pain, palpitations and leg swelling.  Gastrointestinal: Positive for abdominal pain and abdominal distention. Negative for nausea, diarrhea, constipation and blood in stool.  Endocrine: Negative.   Genitourinary: Negative.   Musculoskeletal: Negative for arthralgias and gait problem.  Skin: Negative.   Neurological: Negative for dizziness, weakness, light-headedness and headaches.      Objective:   Physical Exam  Constitutional: She appears well-developed and well-nourished. No distress.  HENT:  Head: Normocephalic and atraumatic.  Eyes: EOM are normal.  Neck: Normal range of motion. No JVD present. No thyromegaly present.  Cardiovascular: Normal rate and regular rhythm.   Faint systolic murmur 2/6 intensity.  Pulmonary/Chest: Effort normal and breath sounds normal. No respiratory distress. She has no wheezes. She has no rales.  Abdominal: Soft. Bowel sounds are normal. She exhibits  no distension. There is no tenderness. There is no rebound.  Skin: Skin is warm and dry.   Filed Vitals:   04/16/14 1021  BP: 148/84  Pulse: 94  Temp: 98.3 F (36.8 C)  TempSrc: Oral  Resp: 14  Height: 5\' 6"  (1.676 m)  Weight: 165 lb (74.844 kg)  SpO2: 97%      Assessment & Plan:

## 2014-05-13 DIAGNOSIS — Z1231 Encounter for screening mammogram for malignant neoplasm of breast: Secondary | ICD-10-CM | POA: Diagnosis not present

## 2014-05-13 LAB — HM MAMMOGRAPHY

## 2014-05-28 ENCOUNTER — Other Ambulatory Visit: Payer: Self-pay | Admitting: Internal Medicine

## 2014-07-10 ENCOUNTER — Encounter: Payer: Self-pay | Admitting: Internal Medicine

## 2014-08-26 ENCOUNTER — Other Ambulatory Visit: Payer: Self-pay

## 2014-10-01 ENCOUNTER — Telehealth: Payer: Self-pay

## 2014-10-01 NOTE — Telephone Encounter (Signed)
LVM for pt to call back in regards to scheduling AWV with our health coach or PCP.   RE: Due for quality metric items as well.

## 2014-10-28 ENCOUNTER — Encounter: Payer: Self-pay | Admitting: Internal Medicine

## 2014-11-05 ENCOUNTER — Telehealth: Payer: Self-pay | Admitting: Internal Medicine

## 2014-11-05 NOTE — Telephone Encounter (Signed)
Pt called and stated her insurance does a home visit check and she is wondering if this will be the same as what you will be doing. Can you please give her a call at [phone] to discuss.  If she doesn't answer just leave a vm and she'll call you back.

## 2014-11-08 ENCOUNTER — Telehealth: Payer: Self-pay

## 2014-11-08 NOTE — Telephone Encounter (Signed)
Rec'd basket mail from [employer], who stated the patient wanted to know if this was the same assessment as the one the insurance provider did Explained to the patient that this was a similar exam, but the office is accountable for meeting this standard of care per medicare and the patient agreed to come in.

## 2014-11-18 ENCOUNTER — Ambulatory Visit (INDEPENDENT_AMBULATORY_CARE_PROVIDER_SITE_OTHER): Payer: Medicare Other | Admitting: Internal Medicine

## 2014-11-18 ENCOUNTER — Other Ambulatory Visit (INDEPENDENT_AMBULATORY_CARE_PROVIDER_SITE_OTHER): Payer: Medicare Other

## 2014-11-18 ENCOUNTER — Encounter: Payer: Self-pay | Admitting: Internal Medicine

## 2014-11-18 VITALS — BP 136/80 | HR 92 | Temp 98.5°F | Resp 14 | Ht 66.5 in | Wt 163.8 lb

## 2014-11-18 DIAGNOSIS — E785 Hyperlipidemia, unspecified: Secondary | ICD-10-CM

## 2014-11-18 DIAGNOSIS — Z23 Encounter for immunization: Secondary | ICD-10-CM

## 2014-11-18 DIAGNOSIS — K219 Gastro-esophageal reflux disease without esophagitis: Secondary | ICD-10-CM

## 2014-11-18 DIAGNOSIS — Z0000 Encounter for general adult medical examination without abnormal findings: Secondary | ICD-10-CM | POA: Diagnosis not present

## 2014-11-18 DIAGNOSIS — E119 Type 2 diabetes mellitus without complications: Secondary | ICD-10-CM

## 2014-11-18 LAB — COMPREHENSIVE METABOLIC PANEL
ALT: 11 U/L (ref 0–35)
AST: 16 U/L (ref 0–37)
Albumin: 4.3 g/dL (ref 3.5–5.2)
Alkaline Phosphatase: 62 U/L (ref 39–117)
BUN: 19 mg/dL (ref 6–23)
CO2: 31 meq/L (ref 19–32)
Calcium: 10 mg/dL (ref 8.4–10.5)
Chloride: 102 mEq/L (ref 96–112)
Creatinine, Ser: 0.76 mg/dL (ref 0.40–1.20)
GFR: 94.84 mL/min (ref >60.00)
GLUCOSE: 123 mg/dL — AB (ref 70–99)
POTASSIUM: 3.7 meq/L (ref 3.5–5.1)
SODIUM: 143 meq/L (ref 135–145)
TOTAL PROTEIN: 7.4 g/dL (ref 6.0–8.3)
Total Bilirubin: 0.5 mg/dL (ref 0.2–1.2)

## 2014-11-18 LAB — LIPID PANEL
CHOL/HDL RATIO: 5
Cholesterol: 214 mg/dL — ABNORMAL HIGH (ref 0–200)
HDL: 44.1 mg/dL (ref >39.00)
LDL CALC: 146 mg/dL — AB (ref 0–99)
NONHDL: 169.95
Triglycerides: 122 mg/dL (ref 0.0–149.0)
VLDL: 24.4 mg/dL (ref 0.0–40.0)

## 2014-11-18 LAB — MICROALBUMIN / CREATININE URINE RATIO
Creatinine,U: 144.5 mg/dL
MICROALB/CREAT RATIO: 0.6 mg/g (ref 0.0–30.0)
Microalb, Ur: 0.8 mg/dL (ref 0.0–1.9)

## 2014-11-18 LAB — HEMOGLOBIN A1C: HEMOGLOBIN A1C: 7.8 % — AB (ref 4.6–6.5)

## 2014-11-18 NOTE — Progress Notes (Signed)
Pre visit review using our clinic review tool, if applicable. No additional management support is needed unless otherwise documented below in the visit note. 

## 2014-11-18 NOTE — Progress Notes (Signed)
   Subjective:    Patient ID: Leslie Rowe, female    DOB: Nov 02, 1935, 79 y.o.   MRN: [id number]  HPI The patient is a 79 YO female coming in for follow up of her chronic medical problems. She is treated for diabetes (controlled on metformin and lisinopril, uncomplicated, no side effects, eye exam yearly), high blood pressure (controlled on lasix and lisinopril/hctz, no complications or side effects) and her cholesterol (controlled on simvastatin, no side effects, no complications). No new complaints today. Is going to the bathroom more often at night and during the day and has for 1-2 years.   PMH, [facility], social history reviewed and updated.   Review of Systems  Constitutional: Negative for fever, activity change, appetite change and fatigue.  HENT: Negative.   Eyes: Negative.   Respiratory: Negative for cough, chest tightness, shortness of breath and wheezing.   Cardiovascular: Negative for chest pain, palpitations and leg swelling.  Gastrointestinal: Negative for nausea, abdominal pain, diarrhea, constipation, blood in stool and abdominal distention.  Musculoskeletal: Negative for arthralgias and gait problem.  Skin: Negative.   Neurological: Negative for dizziness, weakness, light-headedness and headaches.  Psychiatric/Behavioral: Negative.       Objective:   Physical Exam  Constitutional: She is oriented to person, place, and time. She appears well-developed and well-nourished. No distress.  HENT:  Head: Normocephalic and atraumatic.  Eyes: EOM are normal.  Neck: Normal range of motion. No JVD present. No thyromegaly present.  Cardiovascular: Normal rate and regular rhythm.   Faint systolic murmur 2/6 intensity.  Pulmonary/Chest: Effort normal and breath sounds normal. No respiratory distress. She has no wheezes. She has no rales.  Abdominal: Soft. Bowel sounds are normal. She exhibits no distension. There is no tenderness. There is no rebound.  Musculoskeletal: She exhibits no  edema.  Neurological: She is alert and oriented to person, place, and time. Coordination normal.  Skin: Skin is warm and dry.  See foot exam  Psychiatric: She has a normal mood and affect.   Filed Vitals:   11/18/14 1019 11/18/14 1100  BP: 150/80 136/80  Pulse: 92   Temp: 98.5 F (36.9 C)   TempSrc: Oral   Resp: 14   Height: 5' 6.5" (1.689 m)   Weight: 163 lb 12 oz (74.277 kg)   SpO2: 97%       Assessment & Plan:  Prevnar 13 and flu shot given at visit.

## 2014-11-18 NOTE — Assessment & Plan Note (Signed)
Since last year changed from vytorin to simvastatin alone. Checking lipid panel and adjust as needed.

## 2014-11-18 NOTE — Progress Notes (Signed)
Medical screening examination/treatment/procedure(s) were performed by non-physician practitioner and as supervising physician I was immediately available for consultation/collaboration. I agree with above. Kollar, Elizabeth A, MD   

## 2014-11-18 NOTE — Assessment & Plan Note (Signed)
BP at goal on lisinopril/hctz and lasix. Last potassium okay. Likely causing the increased urination as the timeline fits with addition of lasix. Talked to her about trying off the lasix to see if her BP is still at goal and symptoms improved. Checking CMP today and adjust as indicated.

## 2014-11-18 NOTE — Assessment & Plan Note (Signed)
Checking HgA1c, CMP, lipid panel today. Foot exam done. Ordered microalbumin to creatinine ratio. Gets eye exam yearly. No complications. Adjust therapy as needed. Currently on metformin and lisinopril and statin.

## 2014-11-18 NOTE — Progress Notes (Signed)
Subjective:   Leslie Rowe is a 79 y.o. female who presents for Medicare Annual (Subsequent) preventive examination.  Review of Systems:  HRA assessment completed during visit; [place] Patient is here for Annual Wellness Assessment The Patient was informed that this wellness visit is to identify risk and educate on how to reduce risk for increase disease through lifestyle changes.   ROS deferred to follow up exam with physician  DM reviewed; last a1c 7.3 x 1 year ago; x 5 to 79 yo HTN; BP trending up 142/84; continues Furosemide 20mg  and Prinzide; metformin;  BP is generally fine; checks 2 to 3 times per week  Hyperlipidemia medically managed with Zocor; chol 278; Trig 140; LDL 204; HDL 46; ration 6  Educate on low fat diet; CVD recommended diet:  Fat free or low fat dairy products Fish high in omega-3 acids ( salmon, tuna, trout) Fruits, such as apples, bananas, oranges, pears, prunes Legumes, such as kidney beans, lentils, checkpeas, black-eyed peas and lima beans Vegetables; broccoli, cabbage, carrots Whole grains;  Plant fats are better; decrease "Kielan Dreisbach" foods as pasta, rice, bread and desserts, sugar; Avoid red meat (limiting) palm and coconut oils; sugary foods and beverages  Two nutrients that raise blood chol levels are saturated fats and trans fat; in hydrogenated oils and fats, as stick margarine, baked goods (cookes, cakes, pies, crackers; frosting; and coffee creamers;   Some Fats lower cholesterol: Monounsaturated and polyunsaturated  Avocados Corn, sunflower, and soybean oils Nuts and seeds, such as walnuts Olive, canola, peanut, safflower, and sesame oils Peanut butter Salmon and trout Tofu   BMI: running 26.7  Diet; doesn't have a great appetite; [place] and toast; eggs, sausage;  Rasin bran; don't 'drink soda's; drinks water and tea; Lunch; sandwich; go out and buy lunch; subway; [employer] get salad Dinner; baked chicken; very seldom fry anything;  Do eat potatoes,  boil them; Uses limited butter; eats brown rice;  Did love to bake;   Exercise; up and down steps; basement; washer and dryer in basement Lives with son; autistic and lives with her; she takes care of him; swims every day  Discusses lack of sleep; not getting up to urinate;   SAFETY and is one level except for basement;  Discussed long term plans; Dtr in [place];  Safety reviewed for the home; including removal of clutter; clear paths through the home, eliminating clutter, railing as needed; bathroom safety; community safety; smoke detectors and firearms safety as well as sun protection;  Driving accidents no and seatbelt- yes Sun protection; don't go in the sun  Psychosocial support Younger brother has DM; mother and father had htn, but not DM   Stressors; 1-5; 2; manageable   Medication review/ New meds; stopped protonix  Urinary or fecal incontinence reviewed; some urinary issues at hs; voiding frequently  Counseling: DEXA; will discuss with Dr. Doug Sou Colon/oscopy; 01/2007; aged out unless medically necessary EKG  09/2010 Mammogram 05/10/2013 / no evidence of malignancy/ march 2016 at [facility]: Retired from [place]; need to have hearing screen  [id number]  in right; barely in left [facility]hthalmology exam[facility]; has cataracts; no glaucoma  Immunizations Due  PCV 13 Flu vaccine   Cardiac Risk Factors include: advanced age (>35men, >35 women)  HTN; and lipids as well as A1c      Objective:     Vitals: BP 150/80 mmHg  Pulse 92  Temp(Src) 98.5 F (36.9 C) (Oral)  Ht 5' 6.5" (1.689 m)  Wt 163 lb  12 oz (74.277 kg)  BMI 26.04 kg/m2  SpO2 97%  Tobacco History  Smoking status  . Former Smoker  . Quit date: 10/08/1975  Smokeless tobacco  . Never Used     Counseling given: Yes   Past Medical History  Diagnosis Date  . Diabetes mellitus     type II, mild  . Hyperlipidemia   . Hypertension     borderline   Past Surgical History    Procedure Laterality Date  . Abdominal hysterectomy      total   Family History  Problem Relation Age of Onset  . Hypertension Mother   . Other Mother     CVa  . Stroke Mother   . Cancer Brother     Bone  . Other Son     Autistic  . Hypertension Father   . Stroke Father   . Hypertension Sister   . Diabetes Brother    History  Sexual Activity  . Sexual Activity: Not Currently    Outpatient Encounter Prescriptions as of 11/18/2014  Medication Sig  . furosemide (LASIX) 20 MG tablet take 1 tablet by mouth once daily  . lisinopril-hydrochlorothiazide (PRINZIDE,ZESTORETIC) 20-12.5 MG per tablet take 1 tablet by mouth once daily  . metFORMIN (GLUCOPHAGE) 1000 MG tablet Take 1 tablet (1,000 mg total) by mouth 2 (two) times daily with a meal.  . multivitamin (THERAGRAN) tablet Take 1 tablet by mouth daily.    . simvastatin (ZOCOR) 40 MG tablet Take 1 tablet (40 mg total) by mouth at bedtime.   No facility-administered encounter medications on file as of 11/18/2014.    Activities of Daily Living In your present state of health, do you have any difficulty performing the following activities: 11/18/2014  Hearing? N  Vision? N  Difficulty concentrating or making decisions? N  Walking or climbing stairs? N  Dressing or bathing? N  Doing errands, shopping? N  Preparing Food and eating ? N  Using the Toilet? N  In the past six months, have you accidently leaked urine? (No Data)  Do you have problems with loss of bowel control? N  Managing your Medications? N  Managing your Finances? N  Housekeeping or managing your Housekeeping? N    Patient Care Team: Olga Millers, MD as PCP - General (Internal Medicine) Fredderick Erb, MD as Consulting Physician (Ophthalmology)    Assessment:    Assessment   Today patient counseled on age appropriate routine health concerns for screening and prevention, each reviewed and up to date or declined. Immunizations reviewed and educated on  prevnar and flu;  Will dicuss with Dr. Doug Sou. Labs deferred for CPE or medical fup by MD.  Risk factors for depression reviewed and negative. Hearing function and visual acuity are intact. ADLs screened and addressed as needed. Functional ability and level of safety reviewed and appropriate. Educated on memory loss and AD8 completed by nurse from [facility] and stated she was perfect. No issues noted on exam today. Education, counseling and referrals performed based on assessed risks today. Patient provided with a copy of personalized plan for preventive services and due dates  HEPATITIS SCREEN deferred; no risk identified  TOBACCO/ETOH or other DRUG use was negative  RISK FOR CVD or DM  Controllable  risk for heart disease reviewed for goal setting: Waist circumference for Men >40  Triglycerides > 150 HDL < 50 BP > 130/85 Glucose > 100 Goals are determined by the physician and based on other relevant data and risk Plan  Lifestyle changes discussed include more walking and monitoring of BS in am and post prandial lunch and dinner a couple of times per week; Educated on monitoring for trends via high glucose after meals as well as A1c; Agrees to get on treadmill post breakfast and dinner x 15 minutes Referred to diabetes site for more information on diabetes and long term contorl   Stress; (1-5) Risk and reduction techniques reviewed; stress level 2   Resources; [email or web address] to learn more about diabetes long term and treatments    Exercise Activities and Dietary recommendations Current Exercise Habits:: Home exercise routine (busy all day)  Goals    . patient     Will continue to monitor BP and stay focused on her health; Will be motivated to change based on labs;       Fall Risk Fall Risk  11/18/2014  Falls in the past year? No   Depression Screen PHQ 2/9 Scores 11/18/2014  PHQ - 2 Score 0     Cognitive Testing MMSE - Mini Mental State Exam 11/18/2014  Not  completed: (No Data)    Immunization History  Administered Date(s) Administered  . Influenza Whole 01/16/2007  . Influenza, High Dose Seasonal PF 12/06/2012  . Influenza,inj,Quad PF,36+ Mos 12/14/2013  . Pneumococcal Polysaccharide-23 09/30/2010  . Tdap 09/30/2010  . Zoster 10/08/2011   Screening Tests Health Maintenance  Topic Date Due  . OPHTHALMOLOGY EXAM  08/10/1945  . DEXA SCAN  08/10/2002  . PNA vac Low Risk Adult (2 of 2 - PCV13) 09/30/2011  . HEMOGLOBIN A1C  05/13/2014  . INFLUENZA VACCINE  09/30/2014  . FOOT EXAM  11/13/2014  . COLONOSCOPY  02/06/2017  . TETANUS/TDAP  09/29/2020  . ZOSTAVAX  Completed      Plan:   BP came down and states she checks her BP 2 to 3 times per week. Will start to check BS more and start to exercise more. Recommended she review [email or web address] to learn more  Will discuss Dexa; Prevnar and Flu with Dr, Doug Sou.  During the course of the visit the patient was educated and counseled about the following appropriate screening and preventive services:   Vaccines to include Pneumoccal, Influenza, Hepatitis B, Td, Zostavax, HCV  Electrocardiogram/completed  Cardiovascular Disease/ none noted  Colorectal cancer screening/ aged out unless mn  Bone density screening/ to have if Dr. Doug Sou recommends  Diabetes screening/ to draw labs  Glaucoma screening/ neg in Jan 2016; at [facility] eye center  Mammography/PAP / Mammogram completed at [facility] in march and will call and get report  Nutrition counseling / educated  Patient Instructions (the written plan) was given to the patient.   Wynetta Fines, RN  11/18/2014

## 2014-11-18 NOTE — Patient Instructions (Addendum)
[name] , Thank you for taking time to come for your Medicare Wellness Visit. I appreciate your ongoing commitment to your health goals. Please review the following plan we discussed and let me know if I can assist you in the future.   Continue to work on diet;  Continue to monitor sodium  Will check BS FBS in am and start checking post prandial; 2 to 3 times per day Discussed checking bs in am and post meal at lunch and dinner x 3 to 4 a week  Will start using the treadmill 15 minutes after breakfast or supper and more if time   Visit [email or web address] for more information on long term management   These are the goals we discussed: Goals    . patient     Will continue to monitor BP and stay focused on her health; Will be motivated to change based on labs;        This is a list of the screening recommended for you and due dates:  Health Maintenance  Topic Date Due  . Eye exam for diabetics  08/10/1945  . DEXA scan (bone density measurement)  08/10/2002  . Pneumonia vaccines (2 of 2 - PCV13) 09/30/2011  . Hemoglobin A1C  05/13/2014  . Flu Shot  09/30/2014  . Complete foot exam   11/13/2014  . Colon Cancer Screening  02/06/2017  . Tetanus Vaccine  09/29/2020  . Shingles Vaccine  Completed     Health Maintenance Adopting a healthy lifestyle and getting preventive care can go a long way to promote health and wellness. Talk with your health care provider about what schedule of regular examinations is right for you. This is a good chance for you to check in with your provider about disease prevention and staying healthy. In between checkups, there are plenty of things you can do on your own. Experts have done a lot of research about which lifestyle changes and preventive measures are most likely to keep you healthy. Ask your health care provider for more information. WEIGHT AND DIET  Eat a healthy diet  Be sure to include plenty of vegetables, fruits, low-fat dairy products, and lean  protein.  Do not eat a lot of foods high in solid fats, added sugars, or salt.  Get regular exercise. This is one of the most important things you can do for your health.  Most adults should exercise for at least 150 minutes each week. The exercise should increase your heart rate and make you sweat (moderate-intensity exercise).  Most adults should also do strengthening exercises at least twice a week. This is in addition to the moderate-intensity exercise.  Maintain a healthy weight  Body mass index (BMI) is a measurement that can be used to identify possible weight problems. It estimates body fat based on height and weight. Your health care provider can help determine your BMI and help you achieve or maintain a healthy weight.  For females 45 years of age and older:   A BMI below 18.5 is considered underweight.  A BMI of 18.5 to 24.9 is normal.  A BMI of 25 to 29.9 is considered overweight.  A BMI of 30 and above is considered obese.  Watch levels of cholesterol and blood lipids  You should start having your blood tested for lipids and cholesterol at 78 years of age, then have this test every 5 years.  You may need to have your cholesterol levels checked more often if:  Your lipid or cholesterol  levels are high.  You are older than 77 years of age.  You are at high risk for heart disease.  CANCER SCREENING   Lung Cancer  Lung cancer screening is recommended for adults 67-75 years old who are at high risk for lung cancer because of a history of smoking.  A yearly low-dose CT scan of the lungs is recommended for people who:  Currently smoke.  Have quit within the past 15 years.  Have at least a 30-pack-year history of smoking. A pack year is smoking an average of one pack of cigarettes a day for 1 year.  Yearly screening should continue until it has been 15 years since you quit.  Yearly screening should stop if you develop a health problem that would prevent you  from having lung cancer treatment.  Breast Cancer  Practice breast self-awareness. This means understanding how your breasts normally appear and feel.  It also means doing regular breast self-exams. Let your health care provider know about any changes, no matter how small.  If you are in your 20s or 30s, you should have a clinical breast exam (CBE) by a health care provider every 1-3 years as part of a regular health exam.  If you are 47 or older, have a CBE every year. Also consider having a breast X-ray (mammogram) every year.  If you have a family history of breast cancer, talk to your health care provider about genetic screening.  If you are at high risk for breast cancer, talk to your health care provider about having an MRI and a mammogram every year.  Breast cancer gene (BRCA) assessment is recommended for women who have family members with BRCA-related cancers. BRCA-related cancers include:  Breast.  Ovarian.  Tubal.  Peritoneal cancers.  Results of the assessment will determine the need for genetic counseling and BRCA1 and BRCA2 testing. Cervical Cancer Routine pelvic examinations to screen for cervical cancer are no longer recommended for nonpregnant women who are considered low risk for cancer of the pelvic organs (ovaries, uterus, and vagina) and who do not have symptoms. A pelvic examination may be necessary if you have symptoms including those associated with pelvic infections. Ask your health care provider if a screening pelvic exam is right for you.   The Pap test is the screening test for cervical cancer for women who are considered at risk.  If you had a hysterectomy for a problem that was not cancer or a condition that could lead to cancer, then you no longer need Pap tests.  If you are older than 65 years, and you have had normal Pap tests for the past 10 years, you no longer need to have Pap tests.  If you have had past treatment for cervical cancer or a  condition that could lead to cancer, you need Pap tests and screening for cancer for at least 20 years after your treatment.  If you no longer get a Pap test, assess your risk factors if they change (such as having a new sexual partner). This can affect whether you should start being screened again.  Some women have medical problems that increase their chance of getting cervical cancer. If this is the case for you, your health care provider may recommend more frequent screening and Pap tests.  The human papillomavirus (HPV) test is another test that may be used for cervical cancer screening. The HPV test looks for the virus that can cause cell changes in the cervix. The cells collected during  the Pap test can be tested for HPV.  The HPV test can be used to screen women 62 years of age and older. Getting tested for HPV can extend the interval between normal Pap tests from three to five years.  An HPV test also should be used to screen women of any age who have unclear Pap test results.  After 77 years of age, women should have HPV testing as often as Pap tests.  Colorectal Cancer  This type of cancer can be detected and often prevented.  Routine colorectal cancer screening usually begins at 77 years of age and continues through 77 years of age.  Your health care provider may recommend screening at an earlier age if you have risk factors for colon cancer.  Your health care provider may also recommend using home test kits to check for hidden blood in the stool.  A small camera at the end of a tube can be used to examine your colon directly (sigmoidoscopy or colonoscopy). This is done to check for the earliest forms of colorectal cancer.  Routine screening usually begins at age 56.  Direct examination of the colon should be repeated every 5-10 years through 77 years of age. However, you may need to be screened more often if early forms of precancerous polyps or small growths are found. Skin  Cancer  Check your skin from head to toe regularly.  Tell your health care provider about any new moles or changes in moles, especially if there is a change in a mole's shape or color.  Also tell your health care provider if you have a mole that is larger than the size of a pencil eraser.  Always use sunscreen. Apply sunscreen liberally and repeatedly throughout the day.  Protect yourself by wearing long sleeves, pants, a wide-brimmed hat, and sunglasses whenever you are outside. HEART DISEASE, DIABETES, AND HIGH BLOOD PRESSURE   Have your blood pressure checked at least every 1-2 years. High blood pressure causes heart disease and increases the risk of stroke.  If you are between 74 years and 23 years old, ask your health care provider if you should take aspirin to prevent strokes.  Have regular diabetes screenings. This involves taking a blood sample to check your fasting blood sugar level.  If you are at a normal weight and have a low risk for diabetes, have this test once every three years after 77 years of age.  If you are overweight and have a high risk for diabetes, consider being tested at a younger age or more often. PREVENTING INFECTION  Hepatitis B  If you have a higher risk for hepatitis B, you should be screened for this virus. You are considered at high risk for hepatitis B if:  You were born in a country where hepatitis B is common. Ask your health care provider which countries are considered high risk.  Your parents were born in a high-risk country, and you have not been immunized against hepatitis B (hepatitis B vaccine).  You have HIV or AIDS.  You use needles to inject street drugs.  You live with someone who has hepatitis B.  You have had sex with someone who has hepatitis B.  You get hemodialysis treatment.  You take certain medicines for conditions, including cancer, organ transplantation, and autoimmune conditions. Hepatitis C  Blood testing is  recommended for:  Everyone born from [birth year] through 1963.  Anyone with known risk factors for hepatitis C. Sexually transmitted infections (STIs)  You should  be screened for sexually transmitted infections (STIs) including gonorrhea and chlamydia if:  You are sexually active and are younger than 77 years of age.  You are older than 77 years of age and your health care provider tells you that you are at risk for this type of infection.  Your sexual activity has changed since you were last screened and you are at an increased risk for chlamydia or gonorrhea. Ask your health care provider if you are at risk.  If you do not have HIV, but are at risk, it may be recommended that you take a prescription medicine daily to prevent HIV infection. This is called pre-exposure prophylaxis (PrEP). You are considered at risk if:  You are sexually active and do not regularly use condoms or know the HIV status of your partner(s).  You take drugs by injection.  You are sexually active with a partner who has HIV. Talk with your health care provider about whether you are at high risk of being infected with HIV. If you choose to begin PrEP, you should first be tested for HIV. You should then be tested every 3 months for as long as you are taking PrEP.  PREGNANCY   If you are premenopausal and you may become pregnant, ask your health care provider about preconception counseling.  If you may become pregnant, take 400 to 800 micrograms (mcg) of folic acid every day.  If you want to prevent pregnancy, talk to your health care provider about birth control (contraception). OSTEOPOROSIS AND MENOPAUSE   Osteoporosis is a disease in which the bones lose minerals and strength with aging. This can result in serious bone fractures. Your risk for osteoporosis can be identified using a bone density scan.  If you are 31 years of age or older, or if you are at risk for osteoporosis and fractures, ask your health care  provider if you should be screened.  Ask your health care provider whether you should take a calcium or vitamin D supplement to lower your risk for osteoporosis.  Menopause may have certain physical symptoms and risks.  Hormone replacement therapy may reduce some of these symptoms and risks. Talk to your health care provider about whether hormone replacement therapy is right for you.  HOME CARE INSTRUCTIONS   Schedule regular health, dental, and eye exams.  Stay current with your immunizations.   Do not use any tobacco products including cigarettes, chewing tobacco, or electronic cigarettes.  If you are pregnant, do not drink alcohol.  If you are breastfeeding, limit how much and how often you drink alcohol.  Limit alcohol intake to no more than 1 drink per day for nonpregnant women. One drink equals 12 ounces of beer, 5 ounces of wine, or 1 ounces of hard liquor.  Do not use street drugs.  Do not share needles.  Ask your health care provider for help if you need support or information about quitting drugs.  Tell your health care provider if you often feel depressed.  Tell your health care provider if you have ever been abused or do not feel safe at home. Document Released: 08/31/2010 Document Revised: 07/02/2013 Document Reviewed: 01/17/2013 [facility] Patient Information 2015 [place], [place]. This information is not intended to replace advice given to you by your health care provider. Make sure you discuss any questions you have with your health care provider.

## 2014-11-18 NOTE — Assessment & Plan Note (Signed)
Zantac prn for symptoms and doing well off PPI.

## 2014-11-18 NOTE — Assessment & Plan Note (Signed)
Given prevnar 13 and flu shot today, talked to her about dexa and she would like to think about it. Reviewed wellness note and agree. 10 year screening recommendations given to her at visit.

## 2014-12-08 ENCOUNTER — Other Ambulatory Visit: Payer: Self-pay | Admitting: Internal Medicine

## 2014-12-27 ENCOUNTER — Other Ambulatory Visit: Payer: Self-pay | Admitting: Internal Medicine

## 2015-01-04 ENCOUNTER — Other Ambulatory Visit: Payer: Self-pay | Admitting: Internal Medicine

## 2015-05-07 ENCOUNTER — Ambulatory Visit (INDEPENDENT_AMBULATORY_CARE_PROVIDER_SITE_OTHER): Payer: Medicare Other | Admitting: Internal Medicine

## 2015-05-07 ENCOUNTER — Other Ambulatory Visit (INDEPENDENT_AMBULATORY_CARE_PROVIDER_SITE_OTHER): Payer: Medicare Other

## 2015-05-07 ENCOUNTER — Encounter: Payer: Self-pay | Admitting: Internal Medicine

## 2015-05-07 VITALS — BP 138/80 | HR 85 | Temp 98.6°F | Resp 14 | Ht 67.0 in | Wt 162.0 lb

## 2015-05-07 DIAGNOSIS — I10 Essential (primary) hypertension: Secondary | ICD-10-CM | POA: Diagnosis not present

## 2015-05-07 DIAGNOSIS — K219 Gastro-esophageal reflux disease without esophagitis: Secondary | ICD-10-CM

## 2015-05-07 DIAGNOSIS — E785 Hyperlipidemia, unspecified: Secondary | ICD-10-CM | POA: Diagnosis not present

## 2015-05-07 DIAGNOSIS — E119 Type 2 diabetes mellitus without complications: Secondary | ICD-10-CM | POA: Diagnosis not present

## 2015-05-07 LAB — LIPID PANEL
CHOLESTEROL: 174 mg/dL (ref 0–200)
HDL: 41.5 mg/dL (ref >39.00)
LDL Cholesterol: 109 mg/dL — ABNORMAL HIGH (ref 0–99)
NonHDL: 132.05
TRIGLYCERIDES: 114 mg/dL (ref 0.0–149.0)
Total CHOL/HDL Ratio: 4
VLDL: 22.8 mg/dL (ref 0.0–40.0)

## 2015-05-07 LAB — COMPREHENSIVE METABOLIC PANEL
ALBUMIN: 4.5 g/dL (ref 3.5–5.2)
ALK PHOS: 72 U/L (ref 39–117)
ALT: 11 U/L (ref 0–35)
AST: 16 U/L (ref 0–37)
BILIRUBIN TOTAL: 0.5 mg/dL (ref 0.2–1.2)
BUN: 17 mg/dL (ref 6–23)
CALCIUM: 10.2 mg/dL (ref 8.4–10.5)
CO2: 31 mEq/L (ref 19–32)
CREATININE: 0.78 mg/dL (ref 0.40–1.20)
Chloride: 100 mEq/L (ref 96–112)
GFR: 91.92 mL/min (ref >60.00)
Glucose, Bld: 195 mg/dL — ABNORMAL HIGH (ref 70–99)
Potassium: 4 mEq/L (ref 3.5–5.1)
SODIUM: 141 meq/L (ref 135–145)
TOTAL PROTEIN: 7.5 g/dL (ref 6.0–8.3)

## 2015-05-07 LAB — HEMOGLOBIN A1C: Hgb A1c MFr Bld: 8.2 % — ABNORMAL HIGH (ref 4.6–6.5)

## 2015-05-07 NOTE — Progress Notes (Signed)
Pre visit review using our clinic review tool, if applicable. No additional management support is needed unless otherwise documented below in the visit note. 

## 2015-05-07 NOTE — Patient Instructions (Addendum)
We will check the blood work today and call you back with the results.   The allergy medicine that will be good is zyrtec (cetirizine is the generic name) and make sure to get the one without the part D. You can ask the pharmacist if you can't find it.   Diabetes and Exercise Exercising regularly is important. It is not just about losing weight. It has many health benefits, such as:  Improving your overall fitness, flexibility, and endurance.  Increasing your bone density.  Helping with weight control.  Decreasing your body fat.  Increasing your muscle strength.  Reducing stress and tension.  Improving your overall health. People with diabetes who exercise gain additional benefits because exercise:  Reduces appetite.  Improves the body's use of blood sugar (glucose).  Helps lower or control blood glucose.  Decreases blood pressure.  Helps control blood lipids (such as cholesterol and triglycerides).  Improves the body's use of the hormone insulin by:  Increasing the body's insulin sensitivity.  Reducing the body's insulin needs.  Decreases the risk for heart disease because exercising:  Lowers cholesterol and triglycerides levels.  Increases the levels of good cholesterol (such as high-density lipoproteins [HDL]) in the body.  Lowers blood glucose levels. YOUR ACTIVITY PLAN  Choose an activity that you enjoy, and set realistic goals. To exercise safely, you should begin practicing any new physical activity slowly, and gradually increase the intensity of the exercise over time. Your health care provider or diabetes educator can help create an activity plan that works for you. General recommendations include:  Encouraging children to engage in at least 60 minutes of physical activity each day.  Stretching and performing strength training exercises, such as yoga or weight lifting, at least 2 times per week.  Performing a total of at least 150 minutes of  moderate-intensity exercise each week, such as brisk walking or water aerobics.  Exercising at least 3 days per week, making sure you allow no more than 2 consecutive days to pass without exercising.  Avoiding long periods of inactivity (90 minutes or more). When you have to spend an extended period of time sitting down, take frequent breaks to walk or stretch. RECOMMENDATIONS FOR EXERCISING WITH TYPE 1 OR TYPE 2 DIABETES   Check your blood glucose before exercising. If blood glucose levels are greater than 240 mg/dL, check for urine ketones. Do not exercise if ketones are present.  Avoid injecting insulin into areas of the body that are going to be exercised. For example, avoid injecting insulin into:  The arms when playing tennis.  The legs when jogging.  Keep a record of:  Food intake before and after you exercise.  Expected peak times of insulin action.  Blood glucose levels before and after you exercise.  The type and amount of exercise you have done.  Review your records with your health care provider. Your health care provider will help you to develop guidelines for adjusting food intake and insulin amounts before and after exercising.  If you take insulin or oral hypoglycemic agents, watch for signs and symptoms of hypoglycemia. They include:  Dizziness.  Shaking.  Sweating.  Chills.  Confusion.  Drink plenty of water while you exercise to prevent dehydration or heat stroke. Body water is lost during exercise and must be replaced.  Talk to your health care provider before starting an exercise program to make sure it is safe for you. Remember, almost any type of activity is better than none.   This information  is not intended to replace advice given to you by your health care provider. Make sure you discuss any questions you have with your health care provider.   Document Released: 05/08/2003 Document Revised: 07/02/2014 Document Reviewed: 07/25/2012 Elsevier  Interactive Patient Education [employer].

## 2015-05-07 NOTE — Progress Notes (Signed)
   Subjective:    Patient ID: Leslie Rowe, female    DOB: [birth year], 80 y.o.   MRN: [id number]  HPI The patient is a 80 YO female coming in for follow up of her medical conditions including her diabetes (well controlled on metformin and lisinopril, not complicated), her blood pressure (at goal on lisinopril/hctz and lasix prn, not complicated, no side effects), and her cholesterol (doing well on simvastatin, last ldl above goal, no side effects). No new concerns today.   Review of Systems  Constitutional: Negative for fever, activity change, appetite change and fatigue.  HENT: Negative.   Eyes: Negative.   Respiratory: Negative for cough, chest tightness, shortness of breath and wheezing.   Cardiovascular: Negative for chest pain, palpitations and leg swelling.  Gastrointestinal: Negative for nausea, abdominal pain, diarrhea, constipation, blood in stool and abdominal distention.  Musculoskeletal: Negative for arthralgias and gait problem.  Skin: Negative.   Neurological: Negative for dizziness, weakness, light-headedness and headaches.  Psychiatric/Behavioral: Negative.       Objective:   Physical Exam  Constitutional: She is oriented to person, place, and time. She appears well-developed and well-nourished. No distress.  HENT:  Head: Normocephalic and atraumatic.  Eyes: EOM are normal.  Neck: Normal range of motion. No JVD present. No thyromegaly present.  Cardiovascular: Normal rate and regular rhythm.   Faint systolic murmur 2/6 intensity.  Pulmonary/Chest: Effort normal and breath sounds normal. No respiratory distress. She has no wheezes. She has no rales.  Abdominal: Soft. Bowel sounds are normal. She exhibits no distension. There is no tenderness. There is no rebound.  Musculoskeletal: She exhibits no edema.  Neurological: She is alert and oriented to person, place, and time. Coordination normal.  Skin: Skin is warm and dry.  Psychiatric: She has a normal mood and  affect.   Filed Vitals:   05/07/15 1101  BP: 138/80  Pulse: 85  Temp: 98.6 F (37 C)  TempSrc: Oral  Resp: 14  Height: 5\' 7"  (1.702 m)  Weight: 162 lb (73.483 kg)  SpO2: 95%      Assessment & Plan:

## 2015-05-09 NOTE — Assessment & Plan Note (Signed)
On metformin, lisinopril statin. No complications and foot exam up to date. Talked to her about the need for eye exam. Checking HgA1c and adjust as needed.

## 2015-05-09 NOTE — Assessment & Plan Note (Signed)
Has switched from vytorin to simvastatin alone and checking lipid panel today. Adjust as needed.

## 2015-05-09 NOTE — Assessment & Plan Note (Signed)
Off meds and doing well, rare OTC usage for symptoms. Has changed diet to avoid symptoms.

## 2015-05-09 NOTE — Assessment & Plan Note (Signed)
Is on lisinopril/hctz and lasix as needed (takes rarely). Checking CMP and adjust as needed. BP at goal.

## 2015-05-13 ENCOUNTER — Other Ambulatory Visit: Payer: Self-pay | Admitting: Internal Medicine

## 2015-05-13 MED ORDER — SITAGLIPTIN PHOSPHATE 100 MG PO TABS: 100.0000 mg | ORAL_TABLET | Freq: Every day | ORAL | Status: DC

## 2015-05-14 LAB — HM MAMMOGRAPHY

## 2015-06-05 ENCOUNTER — Encounter: Payer: Self-pay | Admitting: Geriatric Medicine

## 2015-10-10 ENCOUNTER — Other Ambulatory Visit (INDEPENDENT_AMBULATORY_CARE_PROVIDER_SITE_OTHER): Payer: Medicare Other

## 2015-10-10 ENCOUNTER — Encounter: Payer: Self-pay | Admitting: Internal Medicine

## 2015-10-10 ENCOUNTER — Ambulatory Visit (INDEPENDENT_AMBULATORY_CARE_PROVIDER_SITE_OTHER): Payer: Medicare Other | Admitting: Internal Medicine

## 2015-10-10 VITALS — BP 132/78 | HR 84 | Temp 98.7°F | Resp 16 | Ht 66.5 in | Wt 160.8 lb

## 2015-10-10 DIAGNOSIS — R1030 Lower abdominal pain, unspecified: Secondary | ICD-10-CM | POA: Insufficient documentation

## 2015-10-10 LAB — COMPREHENSIVE METABOLIC PANEL
ALK PHOS: 72 U/L (ref 39–117)
ALT: 9 U/L (ref 0–35)
AST: 15 U/L (ref 0–37)
Albumin: 4.3 g/dL (ref 3.5–5.2)
BILIRUBIN TOTAL: 0.4 mg/dL (ref 0.2–1.2)
BUN: 17 mg/dL (ref 6–23)
CO2: 31 mEq/L (ref 19–32)
CREATININE: 0.73 mg/dL (ref 0.40–1.20)
Calcium: 9.9 mg/dL (ref 8.4–10.5)
Chloride: 101 mEq/L (ref 96–112)
GFR: 99.12 mL/min (ref >60.00)
GLUCOSE: 166 mg/dL — AB (ref 70–99)
Potassium: 4 mEq/L (ref 3.5–5.1)
SODIUM: 138 meq/L (ref 135–145)
TOTAL PROTEIN: 7.3 g/dL (ref 6.0–8.3)

## 2015-10-10 LAB — CBC
HCT: 34 % — ABNORMAL LOW (ref 36.0–46.0)
Hemoglobin: 10.9 g/dL — ABNORMAL LOW (ref 12.0–15.0)
MCHC: 32.2 g/dL (ref 30.0–36.0)
MCV: 70.2 fl — AB (ref 78.0–100.0)
Platelets: 302 10*3/uL (ref 150.0–400.0)
RBC: 4.83 Mil/uL (ref 3.87–5.11)
RDW: 15 % (ref 11.5–15.5)
WBC: 7 10*3/uL (ref 4.0–10.5)

## 2015-10-10 LAB — LIPASE: Lipase: 12 U/L (ref 11.0–59.0)

## 2015-10-10 LAB — HEMOGLOBIN A1C: Hgb A1c MFr Bld: 7.8 % — ABNORMAL HIGH (ref 4.6–6.5)

## 2015-10-10 NOTE — Patient Instructions (Signed)
We will check the labs today and call you back with the results.   Stop taking the [place] and do not take it again until you hear from [place].

## 2015-10-10 NOTE — Progress Notes (Signed)
Pre visit review using our clinic review tool, if applicable. No additional management support is needed unless otherwise documented below in the visit note. 

## 2015-10-10 NOTE — Progress Notes (Signed)
   Subjective:    Patient ID: Leslie Rowe, female    DOB: 1935-09-10, 80 y.o.   MRN: [id number]  HPI The patient is a 80 YO female coming in for abdominal pain for about 1 week. Started without cause. She is having mild nausea. Has had decreased appetite since starting the januvia. No constipation or diarrhea. No relief with bowel movements. Described as crampy and not severe enough to take anything for. No increased activity or injury prior to start. Not worse with deep breathing.   Review of Systems  Constitutional: Positive for appetite change. Negative for activity change, fatigue and fever.  Respiratory: Negative for cough, chest tightness, shortness of breath and wheezing.   Cardiovascular: Negative for chest pain, palpitations and leg swelling.  Gastrointestinal: Positive for abdominal pain. Negative for abdominal distention, blood in stool, constipation, diarrhea and nausea.  Musculoskeletal: Negative for arthralgias and gait problem.  Skin: Negative.   Neurological: Negative for dizziness, weakness, light-headedness and headaches.  Psychiatric/Behavioral: Negative.       Objective:   Physical Exam  Constitutional: She is oriented to person, place, and time. She appears well-developed and well-nourished. No distress.  HENT:  Head: Normocephalic and atraumatic.  Eyes: EOM are normal.  Neck: Normal range of motion. No JVD present. No thyromegaly present.  Cardiovascular: Normal rate and regular rhythm.   Faint systolic murmur 2/6 intensity.  Pulmonary/Chest: Effort normal and breath sounds normal. No respiratory distress. She has no wheezes. She has no rales.  Abdominal: Soft. Bowel sounds are normal. She exhibits no distension. There is tenderness. There is no rebound.  Minimal tenderness to palpation lower abdomen, no radiation or guarding.   Musculoskeletal: She exhibits no edema.  Neurological: She is alert and oriented to person, place, and time. Coordination normal.    Skin: Skin is warm and dry.  Psychiatric: She has a normal mood and affect.   Vitals:   10/10/15 1305  BP: 132/78  Pulse: 84  Resp: 16  Temp: 98.7 F (37.1 C)  TempSrc: Oral  SpO2: 98%  Weight: 160 lb 12.8 oz (72.9 kg)  Height: 5' 6.5" (1.689 m)      Assessment & Plan:

## 2015-10-10 NOTE — Assessment & Plan Note (Signed)
Checking CBC, CMP, lipase. Will have her hold januvia for possible medication reaction.

## 2015-10-20 ENCOUNTER — Telehealth: Payer: Self-pay | Admitting: Internal Medicine

## 2015-10-20 NOTE — Telephone Encounter (Signed)
Please follow up with patient lab results

## 2015-10-21 NOTE — Telephone Encounter (Signed)
Called and informed patient of lab results. She is to d/c [place]. Patient aware.

## 2015-10-24 NOTE — Telephone Encounter (Signed)
Patient said nothing showed up on the labs and she knows her body and something is wrong and she wants another test because she is still having pain. Please advise, thanks

## 2015-10-24 NOTE — Telephone Encounter (Signed)
Patient still has some questions about all of this. Can you follow up with her when you have time, THank you!

## 2015-10-27 NOTE — Telephone Encounter (Signed)
Would she like to come back to see [place] to see if we can figure out another cause? Or we could get her in with GI?

## 2015-10-28 ENCOUNTER — Encounter: Payer: Self-pay | Admitting: Internal Medicine

## 2015-10-28 ENCOUNTER — Ambulatory Visit (INDEPENDENT_AMBULATORY_CARE_PROVIDER_SITE_OTHER): Payer: Medicare Other | Admitting: Internal Medicine

## 2015-10-28 VITALS — BP 132/78 | HR 100 | Temp 98.4°F | Resp 18 | Ht 66.5 in | Wt 159.8 lb

## 2015-10-28 DIAGNOSIS — R1030 Lower abdominal pain, unspecified: Secondary | ICD-10-CM | POA: Diagnosis not present

## 2015-10-28 DIAGNOSIS — Z23 Encounter for immunization: Secondary | ICD-10-CM

## 2015-10-28 NOTE — Progress Notes (Signed)
   Subjective:    Patient ID: Leslie Rowe, female    DOB: Aug 04, 1935, 80 y.o.   MRN: [id number]  HPI The patient is a 80 YO female coming in for lower abdominal pain. She was seen about 2 weeks ago for the same complaint. She felt like it was improving off her januvia at that time. She now is still having the discomfort in her lower stomach. Denies constipation but not moving her bowels as much. No diarrhea or blood in stools. No nausea or vomiting. Food does not make this better or worse. Not much appetite. Weight is stable.   Review of Systems  Constitutional: Positive for appetite change. Negative for activity change, fatigue and fever.  Respiratory: Negative for cough, chest tightness, shortness of breath and wheezing.   Cardiovascular: Negative for chest pain, palpitations and leg swelling.  Gastrointestinal: Positive for abdominal pain. Negative for abdominal distention, blood in stool, constipation, diarrhea and nausea.  Musculoskeletal: Negative for arthralgias and gait problem.  Skin: Negative.   Neurological: Negative for dizziness, weakness, light-headedness and headaches.  Psychiatric/Behavioral: Negative.       Objective:   Physical Exam  Constitutional: She is oriented to person, place, and time. She appears well-developed and well-nourished. No distress.  HENT:  Head: Normocephalic and atraumatic.  Eyes: EOM are normal.  Neck: Normal range of motion. No JVD present. No thyromegaly present.  Cardiovascular: Normal rate and regular rhythm.   Faint systolic murmur 2/6 intensity.  Pulmonary/Chest: Effort normal and breath sounds normal. No respiratory distress. She has no wheezes. She has no rales.  Abdominal: Soft. Bowel sounds are normal. She exhibits no distension. There is tenderness. There is no rebound.  Minimal tenderness to palpation lower abdomen, no radiation or guarding.   Musculoskeletal: She exhibits no edema.  Neurological: She is alert and oriented to  person, place, and time. Coordination normal.  Skin: Skin is warm and dry.   Vitals:   10/28/15 1103  BP: 132/78  Pulse: 100  Resp: 18  Temp: 98.4 F (36.9 C)  TempSrc: Oral  SpO2: 95%  Weight: 159 lb 12.8 oz (72.5 kg)  Height: 5' 6.5" (1.689 m)      Assessment & Plan:  High dose flu given at visit.

## 2015-10-28 NOTE — Patient Instructions (Signed)
We have given you the flu shot today.   We will get the image of the stomach to make sure nothing is wrong with any of the organs since you are still having the pain.  It is okay to try tylenol for the pain if needed.

## 2015-10-28 NOTE — Assessment & Plan Note (Signed)
Ordering CT abdomen and pelvis with contrast to rule out diverticulitis. Do not suspect appendix given mild course. She will take tylenol for the pain as needed. Labs previously normal. Some mild weight loss.

## 2015-10-28 NOTE — Progress Notes (Signed)
Pre visit review using our clinic review tool, if applicable. No additional management support is needed unless otherwise documented below in the visit note. 

## 2015-10-28 NOTE — Telephone Encounter (Signed)
Patient will be in today

## 2015-11-07 ENCOUNTER — Other Ambulatory Visit: Payer: Self-pay | Admitting: Internal Medicine

## 2015-11-07 ENCOUNTER — Ambulatory Visit
Admission: RE | Admit: 2015-11-07 | Discharge: 2015-11-07 | Disposition: A | Discharge status: Home / Self Care | Payer: Medicare Other | Source: Ambulatory Visit | Attending: Internal Medicine | Admitting: Internal Medicine

## 2015-11-07 DIAGNOSIS — R1030 Lower abdominal pain, unspecified: Secondary | ICD-10-CM

## 2015-11-07 DIAGNOSIS — C801 Malignant (primary) neoplasm, unspecified: Secondary | ICD-10-CM

## 2015-11-07 DIAGNOSIS — C7800 Secondary malignant neoplasm of unspecified lung: Secondary | ICD-10-CM

## 2015-11-07 DIAGNOSIS — C786 Secondary malignant neoplasm of retroperitoneum and peritoneum: Secondary | ICD-10-CM

## 2015-11-07 MED ORDER — IOPAMIDOL (ISOVUE-300) INJECTION 61%
100.0000 mL | Freq: Once | INTRAVENOUS | Status: AC | PRN
  Administered 2015-11-07: 100 mL via INTRAVENOUS

## 2015-11-07 NOTE — Progress Notes (Signed)
Patient called today and notified about the results of her CT scan including possible cancer in her ovaries which has spread to several places in her stomach. She is still eating but lower appetite. Some pain which is bearable. She is distraught but consolable. Advised that referral placed stat for oncology as she will need sampling. Hopefully IR can do peritoneal tap for investigation of primary.

## 2015-11-11 ENCOUNTER — Telehealth: Payer: Self-pay | Admitting: Gynecologic Oncology

## 2015-11-11 NOTE — Telephone Encounter (Signed)
Pt confirmed next day appt, completed intake, in basket referring provider appt date/time.

## 2015-11-12 ENCOUNTER — Other Ambulatory Visit (HOSPITAL_BASED_OUTPATIENT_CLINIC_OR_DEPARTMENT_OTHER): Payer: Medicare Other

## 2015-11-12 ENCOUNTER — Other Ambulatory Visit: Payer: Self-pay | Admitting: Radiology

## 2015-11-12 ENCOUNTER — Other Ambulatory Visit: Payer: Self-pay | Admitting: Gynecologic Oncology

## 2015-11-12 ENCOUNTER — Ambulatory Visit: Payer: Medicare Other | Attending: Gynecologic Oncology | Admitting: Gynecologic Oncology

## 2015-11-12 ENCOUNTER — Encounter: Payer: Self-pay | Admitting: Gynecologic Oncology

## 2015-11-12 VITALS — BP 144/94 | HR 94 | Temp 98.3°F | Resp 18 | Ht 66.5 in | Wt 154.8 lb

## 2015-11-12 DIAGNOSIS — Z823 Family history of stroke: Secondary | ICD-10-CM | POA: Diagnosis not present

## 2015-11-12 DIAGNOSIS — E785 Hyperlipidemia, unspecified: Secondary | ICD-10-CM | POA: Diagnosis not present

## 2015-11-12 DIAGNOSIS — Z808 Family history of malignant neoplasm of other organs or systems: Secondary | ICD-10-CM | POA: Insufficient documentation

## 2015-11-12 DIAGNOSIS — N838 Other noninflammatory disorders of ovary, fallopian tube and broad ligament: Secondary | ICD-10-CM

## 2015-11-12 DIAGNOSIS — Z79899 Other long term (current) drug therapy: Secondary | ICD-10-CM | POA: Insufficient documentation

## 2015-11-12 DIAGNOSIS — C801 Malignant (primary) neoplasm, unspecified: Principal | ICD-10-CM

## 2015-11-12 DIAGNOSIS — C786 Secondary malignant neoplasm of retroperitoneum and peritoneum: Secondary | ICD-10-CM

## 2015-11-12 DIAGNOSIS — Z87891 Personal history of nicotine dependence: Secondary | ICD-10-CM | POA: Diagnosis not present

## 2015-11-12 DIAGNOSIS — R030 Elevated blood-pressure reading, without diagnosis of hypertension: Secondary | ICD-10-CM | POA: Insufficient documentation

## 2015-11-12 DIAGNOSIS — Z803 Family history of malignant neoplasm of breast: Secondary | ICD-10-CM | POA: Insufficient documentation

## 2015-11-12 DIAGNOSIS — C569 Malignant neoplasm of unspecified ovary: Secondary | ICD-10-CM | POA: Diagnosis not present

## 2015-11-12 DIAGNOSIS — N839 Noninflammatory disorder of ovary, fallopian tube and broad ligament, unspecified: Secondary | ICD-10-CM

## 2015-11-12 DIAGNOSIS — Z7984 Long term (current) use of oral hypoglycemic drugs: Secondary | ICD-10-CM | POA: Diagnosis not present

## 2015-11-12 DIAGNOSIS — E119 Type 2 diabetes mellitus without complications: Secondary | ICD-10-CM | POA: Insufficient documentation

## 2015-11-12 DIAGNOSIS — Z90710 Acquired absence of both cervix and uterus: Secondary | ICD-10-CM | POA: Insufficient documentation

## 2015-11-12 DIAGNOSIS — Z833 Family history of diabetes mellitus: Secondary | ICD-10-CM | POA: Diagnosis not present

## 2015-11-12 DIAGNOSIS — Z8249 Family history of ischemic heart disease and other diseases of the circulatory system: Secondary | ICD-10-CM | POA: Insufficient documentation

## 2015-11-12 DIAGNOSIS — Z885 Allergy status to narcotic agent status: Secondary | ICD-10-CM | POA: Insufficient documentation

## 2015-11-12 LAB — CEA (IN HOUSE-CHCC): CEA (CHCC-IN HOUSE): 8.36 ng/mL — AB (ref 0.00–5.00)

## 2015-11-12 MED ORDER — SODIUM CHLORIDE 0.9 % IV SOLN: INTRAVENOUS | Status: AC

## 2015-11-12 NOTE — Progress Notes (Signed)
Consult Note: Gyn-Onc  Jake Michaelis 80 y.o. female  CC:  Chief Complaint  Patient presents with  . Peritoneal carcinomatosis    New Consultation    HPI: Patient is seen today in consultation at the request of Dr. Pricilla Holm.  Patient is a very pleasant 80 year old gravida 3 para 2 who has been on a variety of different medications for her diabetes. Some of intermittently caused her to have some abdominal pain as well as gastroesophageal reflux symptoms. She was placed on Januvia in September of last year. Since that time she began noticing some abdominal soreness and discomfort. Gradually increased over the summer. She subsequently stopped taking the medication in July and she thinks that the soreness improved a little bit. However, she was seen by her physician in August at that time blood work was done and everything was "okay". The pain progressively increased and started moving to different areas in her abdomen. She will bring up merely noticed the pain at night when she was unable to lay on her side and one half to lay on her back. Concomitant with all that she does endorse some decreased appetite as well as early satiety. She denies any nausea or vomiting but she does have that felt like eating. She's had no change in her bladder habits but has noticed some increased constipation and occasionally some diarrhea. She is up-to-date on her colonoscopy having had her last one 5 years ago. She had her mammogram this year. Of note her sister is currently being treated for breast cancer here at the cancer center. She is 78 years old and is undergoing radiation therapy. She had a brother who had cancer of unknown primary.  She had a CT scan performed 9/8 that revealed: FINDINGS: -Lower chest: Numerous bibasilar pulmonary nodules highly suspicious for pulmonary metastases. Nodules measure up to 10 mm LEFT image 3 and 8 mm RIGHT image 5. Small RIGHT pleural effusion. Mild RIGHT basilar  atelectasis. -Hepatobiliary: Nonspecific RIGHT lobe liver lesion 11 x 7 mm image 25. Remainder of liver normal appearance. Gallbladder unremarkable. -Pancreas: Mild pancreatic udctal dilatation at tail with a subtle low-attenuation region at pancreatic tail cannot exclude mass 19 x 8 mm. Adjacent soft tissue likely tumor in lesser sac. -Spleen: Normal appearance -Adrenals/Urinary Tract: Adrenal thickening without discrete mass. Kidneys, ureters, and bladder unremarkable. -Stomach/Bowel: Diffuse colonic diverticulosis. Distal small bowel loops on opacified and under distended limiting assessment. Food debris and contrast in stomach. -Vascular/Lymphatic: Atherosclerotic calcifications aorta. Attenuation retrocrural nodes largest 16 mm short axis image 16. RIGHT cardiophrenic angle nodes largest 8 mm short axis image 9. 8 mm gastrohepatic ligament node image 16.  -Reproductive: Uterus surgically absent. Masslike enlargement in RIGHT adnexa question RIGHT ovarian neoplasm, solid and cystic, 5.8 x 3.7 x 4.7 cm. Additional predominantly cystic mass with smaller soft tissue component in LEFT adnexa likely LEFT ovarian origin 6.1 x 2.8 x 7.0 cm. Findings could represent ovarian neoplasms or tumor deposits at the ovaries from peritoneal carcinomatosis. Diffuse omental tumor infiltration up to 3 cm thick. Additional tumor deposits are seen in the pericolic gutters bilaterally. Large tumor deposit at the cul de sac at least 4.1 cm diameter. Probable additional tumor deposits along small bowel loops and colon in pelvis as well as at the pericolic gutters.  -Other: Small amount of perihepatic free fluid. BILATERAL inguinal hernias containing fat. No free air. IMPRESSION: -Multiple pulmonary nodules bilaterally compatible with pulmonary metastases. -Peritoneal carcinomatosis with tumor deposits throughout omentum andat the pericolic gutters  as well as within the pelvis. -BILATERAL cystic and solid ovarian masses,  predominately cystic on LEFT and predominately solid on RIGHT, question ovarian neoplasmsversus tumor deposits at the ovaries bilaterally from peritonealcarcinomatosis.  Low-attenuation retrocrural nodes.  Small RIGHT pleural effusion and minimal perihepatic ascites.  Question metastasis at pancreatic tail.  Review of Systems: Constitutional: Denies fever. Skin: No rash Cardiovascular: No chest pain, shortness of breath, or edema  Pulmonary: No cough  Gastro Intestinal: Reporting intermittent lower abdominal soreness.  No nausea, vomiting, + constipation, + diarrhea reported.  Genitourinary: No frequency, urgency, or dysuria.  Denies vaginal bleeding and discharge.  Musculoskeletal: No joint swelling or pain.  Neurologic: No weakness.  Psychology: Worried  Current Meds:  Outpatient Encounter Prescriptions as of 11/12/2015  Medication Sig  . lisinopril-hydrochlorothiazide (PRINZIDE,ZESTORETIC) 20-12.5 MG tablet take 1 tablet by mouth once daily  . metFORMIN (GLUCOPHAGE) 1000 MG tablet take 1 tablet by mouth twice a day with food  . multivitamin (THERAGRAN) tablet Take 1 tablet by mouth daily.    . simvastatin (ZOCOR) 40 MG tablet take 1 tablet by mouth at bedtime (Patient not taking: Reported on 11/12/2015)   No facility-administered encounter medications on file as of 11/12/2015.     Allergy:  Allergies  Allergen Reactions  . Codeine     Social Hx:   Social History   Social History  . Marital status: Divorced    Spouse name: N/A  . Number of children: 2  . Years of education: 12   Occupational History  . retired Astronomer Tobacco   Social History Main Topics  . Smoking status: Former Smoker    Quit date: 10/08/1975  . Smokeless tobacco: Never Used  . Alcohol use No  . Drug use: No  . Sexual activity: Not Currently   Other Topics Concern  . Not on file   Social History Narrative   HSG. Married '60 - 74yr/divorced. Married '80's - 39yrs/divorced. 1 dtr - '70,  1 son - '64. 2 grandchildren. Retired - [employer]. Lives in own home, son is autistic and lives with her. Staying busy.              Past Surgical Hx:  Past Surgical History:  Procedure Laterality Date  . ABDOMINAL HYSTERECTOMY     total    Past Medical Hx:  Past Medical History:  Diagnosis Date  . Diabetes mellitus    type II, mild  . Hyperlipidemia   . Hypertension    borderline    Oncology Hx:   No history exists.    Family Hx:  Family History  Problem Relation Age of Onset  . Hypertension Mother   . Other Mother     CVa  . Stroke Mother   . Cancer Brother     Bone  . Other Son     Autistic  . Hypertension Father   . Stroke Father   . Hypertension Sister   . Diabetes Brother     Vitals:  Blood pressure (!) 144/94, pulse 94, temperature 98.3 F (36.8 C), temperature source Oral, resp. rate 18, height 5' 6.5" (1.689 m), weight 154 lb 12.8 oz (70.2 kg).  Physical Exam: Well-nourished older white female in no acute distress.  Neck: Supple, no lymphadenopathy, no thyromegaly.  Lungs: Clear to auscultation.  Cardiac: Regular rate and rhythm with a soft 2/6 systolic ejection murmur.  Abdomen: Well-healed vertical midline incision with no evidence of an incisional hernia. Positive fluid wave. Abdomen is soft slightly distended. Nontender.  There is no rebound or guarding. There is no distinct masses.  Groins: No lymphadenopathy.  Extremities no edema.  Pelvic: External genitalia within normal limits. Bimanual termination was a fixed mass at the top of the vaginal cuff towards her left side. It is nontender. There is nodularity. On rectovaginal examination there were no masses appreciated in the rectovaginal septum.  Assessment/Plan: 80 year old gravida 3 para 2 with what appears to be diffuse peritoneal carcinomatosis. At this time I believe we need to obtain a tissue biopsy for histology to ensure that we are dealing with an ovarian malignancy. The  constellation is suspicious for that I discussed that with the patient and her goddaughter. We will check a CA-125 and a CEA today. I do not believe based on the appearance of her CT scan that she can be debulked to an R0. Therefore we would recommend neoadjuvant chemotherapy. However, until we have histologic confirmation of histology would not proceed empirically with treatment. We also discussed the fact that her sister is currently being treated for breast cancer. If the patient does appear to have a primary gynecologic malignancy we would recommend genetic testing for BRCA mutation.  The patient and I completely reviewed her CT scan line by line. She was appreciative of this. She is understandably very worried and concerned. She has a daughter who lives in [place] was a single mom and would like to come down when we discuss results. Her son has autism. I discussed with her that if transportation is difficult for her IV happy to set up a conference call to discuss results with the patient and her family. She thinks that this would be how she would like to proceed.  Signed her up for my chart and she will have her goddaughter as her proxy. We'll release results of your bloodwork from today and I will contact him with the results of the biopsy.  We appreciate the opportunity to partner in the care of this very pleasant patient.  Rossie Bretado A., MD 11/12/2015, 8:47 AM

## 2015-11-12 NOTE — Patient Instructions (Addendum)
Dr Alycia Rossetti has ordered a CA 125 level and a CEA level today, she has also ordered a CT guided biopsy . The radiology department will contact with the date and time of the CT biopsy. We will contact you with the results of your labs, please call with any changes , questions or concerns.  Thank you

## 2015-11-13 ENCOUNTER — Other Ambulatory Visit: Payer: Self-pay | Admitting: Radiology

## 2015-11-13 LAB — CA 125: CANCER ANTIGEN (CA) 125: 301.8 U/mL — AB (ref 0.0–38.1)

## 2015-11-14 ENCOUNTER — Ambulatory Visit (HOSPITAL_COMMUNITY)
Admission: RE | Admit: 2015-11-14 | Discharge: 2015-11-14 | Disposition: A | Discharge status: Home / Self Care | Payer: Medicare Other | Source: Ambulatory Visit | Attending: Gynecologic Oncology | Admitting: Gynecologic Oncology

## 2015-11-14 ENCOUNTER — Encounter (HOSPITAL_COMMUNITY): Payer: Self-pay

## 2015-11-14 DIAGNOSIS — Z833 Family history of diabetes mellitus: Secondary | ICD-10-CM | POA: Insufficient documentation

## 2015-11-14 DIAGNOSIS — C786 Secondary malignant neoplasm of retroperitoneum and peritoneum: Secondary | ICD-10-CM | POA: Diagnosis not present

## 2015-11-14 DIAGNOSIS — E119 Type 2 diabetes mellitus without complications: Secondary | ICD-10-CM | POA: Diagnosis not present

## 2015-11-14 DIAGNOSIS — C801 Malignant (primary) neoplasm, unspecified: Secondary | ICD-10-CM | POA: Diagnosis not present

## 2015-11-14 DIAGNOSIS — Z7984 Long term (current) use of oral hypoglycemic drugs: Secondary | ICD-10-CM | POA: Insufficient documentation

## 2015-11-14 DIAGNOSIS — Z6824 Body mass index (BMI) 24.0-24.9, adult: Secondary | ICD-10-CM | POA: Diagnosis not present

## 2015-11-14 DIAGNOSIS — Z8249 Family history of ischemic heart disease and other diseases of the circulatory system: Secondary | ICD-10-CM | POA: Insufficient documentation

## 2015-11-14 DIAGNOSIS — R030 Elevated blood-pressure reading, without diagnosis of hypertension: Secondary | ICD-10-CM | POA: Insufficient documentation

## 2015-11-14 DIAGNOSIS — R634 Abnormal weight loss: Secondary | ICD-10-CM | POA: Insufficient documentation

## 2015-11-14 DIAGNOSIS — Z87891 Personal history of nicotine dependence: Secondary | ICD-10-CM | POA: Diagnosis not present

## 2015-11-14 DIAGNOSIS — N838 Other noninflammatory disorders of ovary, fallopian tube and broad ligament: Secondary | ICD-10-CM

## 2015-11-14 DIAGNOSIS — Z823 Family history of stroke: Secondary | ICD-10-CM | POA: Insufficient documentation

## 2015-11-14 DIAGNOSIS — E785 Hyperlipidemia, unspecified: Secondary | ICD-10-CM | POA: Insufficient documentation

## 2015-11-14 DIAGNOSIS — Z90710 Acquired absence of both cervix and uterus: Secondary | ICD-10-CM | POA: Diagnosis not present

## 2015-11-14 DIAGNOSIS — Z885 Allergy status to narcotic agent status: Secondary | ICD-10-CM | POA: Insufficient documentation

## 2015-11-14 DIAGNOSIS — Z79899 Other long term (current) drug therapy: Secondary | ICD-10-CM | POA: Insufficient documentation

## 2015-11-14 LAB — GLUCOSE, CAPILLARY: Glucose-Capillary: 200 mg/dL — ABNORMAL HIGH (ref 65–99)

## 2015-11-14 LAB — CBC
HEMATOCRIT: 35.2 % — AB (ref 36.0–46.0)
Hemoglobin: 11 g/dL — ABNORMAL LOW (ref 12.0–15.0)
MCH: 22.2 pg — ABNORMAL LOW (ref 26.0–34.0)
MCHC: 31.3 g/dL (ref 30.0–36.0)
MCV: 71 fL — ABNORMAL LOW (ref 78.0–100.0)
PLATELETS: 323 10*3/uL (ref 150–400)
RBC: 4.96 MIL/uL (ref 3.87–5.11)
RDW: 15 % (ref 11.5–15.5)
WBC: 7.5 10*3/uL (ref 4.0–10.5)

## 2015-11-14 LAB — PROTIME-INR
INR: 1.03
PROTHROMBIN TIME: 13.5 s (ref 11.4–15.2)

## 2015-11-14 LAB — APTT: APTT: 31 s (ref 24–36)

## 2015-11-14 MED ORDER — FENTANYL CITRATE (PF) 100 MCG/2ML IJ SOLN
INTRAMUSCULAR | Status: AC
  Filled 2015-11-14: qty 2

## 2015-11-14 MED ORDER — GELATIN ABSORBABLE 12-7 MM EX MISC
CUTANEOUS | Status: AC
  Filled 2015-11-14: qty 1

## 2015-11-14 MED ORDER — LIDOCAINE HCL 1 % IJ SOLN
INTRAMUSCULAR | Status: AC
  Filled 2015-11-14: qty 20

## 2015-11-14 MED ORDER — FENTANYL CITRATE (PF) 100 MCG/2ML IJ SOLN
INTRAMUSCULAR | Status: DC | PRN
  Administered 2015-11-14: 25 ug via INTRAVENOUS
  Administered 2015-11-14: 50 ug via INTRAVENOUS

## 2015-11-14 MED ORDER — MIDAZOLAM HCL 2 MG/2ML IJ SOLN
INTRAMUSCULAR | Status: DC | PRN
  Administered 2015-11-14: 0.5 mg via INTRAVENOUS
  Administered 2015-11-14: 1 mg via INTRAVENOUS

## 2015-11-14 MED ORDER — SODIUM CHLORIDE 0.9 % IV SOLN: INTRAVENOUS | Status: DC

## 2015-11-14 MED ORDER — MIDAZOLAM HCL 2 MG/2ML IJ SOLN
INTRAMUSCULAR | Status: AC
  Filled 2015-11-14: qty 2

## 2015-11-14 NOTE — H&P (Signed)
Chief Complaint: suspected ovarian cancer with carcinomatosis  Referring Physician: Dr. Nancy Marus  Supervising Physician: Aletta Edouard  Patient Status:  Out-pt  HPI: Leslie Rowe is an 80 y.o. female who has been having some weight loss over the last 3-4 months of 15-20lbs.  She has been having some abdominal soreness and distention.  She had a CT scan ordered about a week ago that revealed  Multiple pulmonary nodules bilaterally compatible with pulmonary metastases.  Peritoneal carcinomatosis with tumor deposits throughout omentum and at the pericolic gutters as well as within the pelvis.  BILATERAL cystic and solid ovarian masses, predominately cystic on LEFT and predominately solid on RIGHT, question ovarian neoplasms versus tumor deposits at the ovaries bilaterally from peritoneal carcinomatosis.  Low-attenuation retrocrural nodes.  Small RIGHT pleural effusion and minimal perihepatic ascites.  Question metastasis at pancreatic tail.  She has been seen by GYN/ONC who has sent her here today for a biopsy of her omentum to confirm a diagnosis.  Past Medical History:  Past Medical History:  Diagnosis Date  . Diabetes mellitus    type II, mild  . Hyperlipidemia   . Hypertension    borderline    Past Surgical History:  Past Surgical History:  Procedure Laterality Date  . ABDOMINAL HYSTERECTOMY     total    Family History:  Family History  Problem Relation Age of Onset  . Hypertension Mother   . Other Mother     CVa  . Stroke Mother   . Cancer Brother     Bone  . Other Son     Autistic  . Hypertension Father   . Stroke Father   . Hypertension Sister   . Diabetes Brother     Social History:  reports that she quit smoking about 40 years ago. She has never used smokeless tobacco. She reports that she does not drink alcohol or use drugs.  Allergies:  Allergies  Allergen Reactions  . Codeine Nausea And Vomiting     Medications: Medications reviewed in epic  Please HPI for pertinent positives, otherwise complete 10 system ROS negative.  Mallampati Score: MD Evaluation Airway: WNL Heart: WNL Abdomen: WNL Chest/ Lungs: WNL ASA  Classification: 2 Mallampati/Airway Score: Two  Physical Exam: BP (!) 153/84   Pulse 92   Temp 98.3 F (36.8 C) (Oral)   Resp 18   Ht 5\' 7"  (1.702 m)   Wt 154 lb (69.9 kg)   SpO2 98%   BMI 24.12 kg/m  Body mass index is 24.12 kg/m. General: pleasant, WD, WN black female who is laying in bed in NAD HEENT: head is normocephalic, atraumatic.  Sclera are noninjected.  PERRL.  Ears and nose without any masses or lesions.  Mouth is pink and moist Heart: regular, rate, and rhythm.  Normal s1,s2. No obvious murmurs, gallops, or rubs noted.  Palpable radial and pedal pulses bilaterally Lungs: CTAB, no wheezes, rhonchi, or rales noted.  Respiratory effort nonlabored Abd: soft, minimally sore to palpation, ND, +BS, no masses, hernias, or organomegaly MS: all 4 extremities are symmetrical with no cyanosis, clubbing, or edema. Psych: A&Ox3 with an appropriate affect.   Labs: Results for orders placed or performed during the hospital encounter of 11/14/15 (from the past 48 hour(s))  Glucose, capillary     Status: Abnormal   Collection Time: 11/14/15  9:46 AM  Result Value Ref Range   Glucose-Capillary 200 (H) 65 - 99 mg/dL   Comment 1 Notify RN  Comment 2 Document in Chart    Other labs pending  Imaging: No results found.  Assessment/Plan 1. Probably ovarian cancer with carcinomatosis  -we will proceed today with an omental biopsy to hopefully assist in confirming a diagnosis for this patient -labs are pending, vitals reviewed -Risks and Benefits discussed with the patient including, but not limited to bleeding, infection, damage to adjacent structures or low yield requiring additional tests. All of the patient's questions were answered, patient is agreeable  to proceed. Consent signed and in chart.  Thank you for this interesting consult.  I greatly enjoyed meeting Leslie Rowe and look forward to participating in their care.  A copy of this report was sent to the requesting provider on this date.  Electronically Signed: Henreitta Cea 11/14/2015, 10:10 AM   I spent a total of  30 Minutes   in face to face in clinical consultation, greater than 50% of which was counseling/coordinating care for omental caking/nodule (carcinomatosis)

## 2015-11-14 NOTE — Procedures (Signed)
Interventional Radiology Procedure Note  Procedure:  CT guided omental/peritoneal biopsy  Complications: None  Estimated Blood Loss: < 10 mL  Anterior peritoneal tumor targeted in right pelvis; approached from left of midline. 18 G core device through 17 G needle; 3 passes yielded 2 intact core samples.  Plan: 90 min recovery  Dajah Fischman T. Kathlene Cote, M.D Pager:  [phone]

## 2015-11-14 NOTE — Sedation Documentation (Signed)
Patient denies pain and is resting comfortably.  

## 2015-11-14 NOTE — Discharge Instructions (Signed)

## 2015-11-19 ENCOUNTER — Encounter: Payer: Medicare Other | Admitting: Internal Medicine

## 2015-11-20 ENCOUNTER — Telehealth: Payer: Self-pay | Admitting: *Deleted

## 2015-11-20 NOTE — Telephone Encounter (Signed)
Notified patient's daughter of future appointments. Pt has scheduled appointments at [facility] on 10/4 , 10/5 , 10/6. Pt's  daughter agreed with time and dates of appointments

## 2015-11-30 ENCOUNTER — Other Ambulatory Visit: Payer: Self-pay | Admitting: Oncology

## 2015-11-30 DIAGNOSIS — C569 Malignant neoplasm of unspecified ovary: Secondary | ICD-10-CM

## 2015-12-03 ENCOUNTER — Ambulatory Visit: Payer: Medicare Other | Attending: Gynecologic Oncology | Admitting: Gynecologic Oncology

## 2015-12-03 ENCOUNTER — Encounter: Payer: Self-pay | Admitting: Gynecologic Oncology

## 2015-12-03 VITALS — BP 148/70 | HR 99 | Temp 98.1°F | Resp 18 | Ht 67.0 in | Wt 153.3 lb

## 2015-12-03 DIAGNOSIS — Z833 Family history of diabetes mellitus: Secondary | ICD-10-CM | POA: Insufficient documentation

## 2015-12-03 DIAGNOSIS — Z7984 Long term (current) use of oral hypoglycemic drugs: Secondary | ICD-10-CM | POA: Insufficient documentation

## 2015-12-03 DIAGNOSIS — E119 Type 2 diabetes mellitus without complications: Secondary | ICD-10-CM | POA: Insufficient documentation

## 2015-12-03 DIAGNOSIS — R030 Elevated blood-pressure reading, without diagnosis of hypertension: Secondary | ICD-10-CM | POA: Insufficient documentation

## 2015-12-03 DIAGNOSIS — R634 Abnormal weight loss: Secondary | ICD-10-CM | POA: Diagnosis not present

## 2015-12-03 DIAGNOSIS — C482 Malignant neoplasm of peritoneum, unspecified: Secondary | ICD-10-CM | POA: Insufficient documentation

## 2015-12-03 DIAGNOSIS — E785 Hyperlipidemia, unspecified: Secondary | ICD-10-CM | POA: Insufficient documentation

## 2015-12-03 DIAGNOSIS — Z87891 Personal history of nicotine dependence: Secondary | ICD-10-CM | POA: Diagnosis not present

## 2015-12-03 DIAGNOSIS — Z8249 Family history of ischemic heart disease and other diseases of the circulatory system: Secondary | ICD-10-CM | POA: Diagnosis not present

## 2015-12-03 DIAGNOSIS — Z885 Allergy status to narcotic agent status: Secondary | ICD-10-CM | POA: Insufficient documentation

## 2015-12-03 DIAGNOSIS — R6881 Early satiety: Secondary | ICD-10-CM | POA: Diagnosis not present

## 2015-12-03 DIAGNOSIS — K5900 Constipation, unspecified: Secondary | ICD-10-CM | POA: Diagnosis not present

## 2015-12-03 DIAGNOSIS — Z823 Family history of stroke: Secondary | ICD-10-CM | POA: Diagnosis not present

## 2015-12-03 DIAGNOSIS — C786 Secondary malignant neoplasm of retroperitoneum and peritoneum: Secondary | ICD-10-CM

## 2015-12-03 DIAGNOSIS — Z803 Family history of malignant neoplasm of breast: Secondary | ICD-10-CM | POA: Diagnosis not present

## 2015-12-03 DIAGNOSIS — Z79899 Other long term (current) drug therapy: Secondary | ICD-10-CM | POA: Diagnosis not present

## 2015-12-03 DIAGNOSIS — C801 Malignant (primary) neoplasm, unspecified: Secondary | ICD-10-CM

## 2015-12-03 DIAGNOSIS — C7800 Secondary malignant neoplasm of unspecified lung: Secondary | ICD-10-CM | POA: Diagnosis not present

## 2015-12-03 NOTE — Patient Instructions (Addendum)
We will place a referral for you to meet with the [facility].  Plan to follow up with Dr. Marko Plume as planned.  Carboplatin injection What is this medicine? CARBOPLATIN (KAR boe pla tin) is a chemotherapy drug. It targets fast dividing cells, like cancer cells, and causes these cells to die. This medicine is used to treat ovarian cancer and many other cancers. This medicine may be used for other purposes; ask your health care provider or pharmacist if you have questions. What should I tell my health care provider before I take this medicine? They need to know if you have any of these conditions: -blood disorders -hearing problems -kidney disease -recent or ongoing radiation therapy -an unusual or allergic reaction to carboplatin, cisplatin, other chemotherapy, other medicines, foods, dyes, or preservatives -pregnant or trying to get pregnant -breast-feeding How should I use this medicine? This drug is usually given as an infusion into a vein. It is administered in a hospital or clinic by a specially trained health care professional. Talk to your pediatrician regarding the use of this medicine in children. Special care may be needed. Overdosage: If you think you have taken too much of this medicine contact a poison control center or emergency room at once. NOTE: This medicine is only for you. Do not share this medicine with others. What if I miss a dose? It is important not to miss a dose. Call your doctor or health care professional if you are unable to keep an appointment. What may interact with this medicine? -medicines for seizures -medicines to increase blood counts like filgrastim, pegfilgrastim, sargramostim -some antibiotics like amikacin, gentamicin, neomycin, streptomycin, tobramycin -vaccines Talk to your doctor or health care professional before taking any of these medicines: -acetaminophen -aspirin -ibuprofen -ketoprofen -naproxen This list may not describe all  possible interactions. Give your health care provider a list of all the medicines, herbs, non-prescription drugs, or dietary supplements you use. Also tell them if you smoke, drink alcohol, or use illegal drugs. Some items may interact with your medicine. What should I watch for while using this medicine? Your condition will be monitored carefully while you are receiving this medicine. You will need important blood work done while you are taking this medicine. This drug may make you feel generally unwell. This is not uncommon, as chemotherapy can affect healthy cells as well as cancer cells. Report any side effects. Continue your course of treatment even though you feel ill unless your doctor tells you to stop. In some cases, you may be given additional medicines to help with side effects. Follow all directions for their use. Call your doctor or health care professional for advice if you get a fever, chills or sore throat, or other symptoms of a cold or flu. Do not treat yourself. This drug decreases your body's ability to fight infections. Try to avoid being around people who are sick. This medicine may increase your risk to bruise or bleed. Call your doctor or health care professional if you notice any unusual bleeding. Be careful brushing and flossing your teeth or using a toothpick because you may get an infection or bleed more easily. If you have any dental work done, tell your dentist you are receiving this medicine. Avoid taking products that contain aspirin, acetaminophen, ibuprofen, naproxen, or ketoprofen unless instructed by your doctor. These medicines may hide a fever. Do not become pregnant while taking this medicine. Women should inform their doctor if they wish to become pregnant or think they might be  pregnant. There is a potential for serious side effects to an unborn child. Talk to your health care professional or pharmacist for more information. Do not breast-feed an infant while taking  this medicine. What side effects may I notice from receiving this medicine? Side effects that you should report to your doctor or health care professional as soon as possible: -allergic reactions like skin rash, itching or hives, swelling of the face, lips, or tongue -signs of infection - fever or chills, cough, sore throat, pain or difficulty passing urine -signs of decreased platelets or bleeding - bruising, pinpoint red spots on the skin, black, tarry stools, nosebleeds -signs of decreased red blood cells - unusually weak or tired, fainting spells, lightheadedness -breathing problems -changes in hearing -changes in vision -chest pain -high blood pressure -low blood counts - This drug may decrease the number of white blood cells, red blood cells and platelets. You may be at increased risk for infections and bleeding. -nausea and vomiting -pain, swelling, redness or irritation at the injection site -pain, tingling, numbness in the hands or feet -problems with balance, talking, walking -trouble passing urine or change in the amount of urine Side effects that usually do not require medical attention (report to your doctor or health care professional if they continue or are bothersome): -hair loss -loss of appetite -metallic taste in the mouth or changes in taste This list may not describe all possible side effects. Call your doctor for medical advice about side effects. You may report side effects to FDA at 1-800-FDA-1088. Where should I keep my medicine? This drug is given in a hospital or clinic and will not be stored at home. NOTE: This sheet is a summary. It may not cover all possible information. If you have questions about this medicine, talk to your doctor, pharmacist, or health care provider.    2016, Elsevier/Gold Standard. (2007-05-23 14:38:05) Paclitaxel injection What is this medicine? PACLITAXEL (PAK li TAX el) is a chemotherapy drug. It targets fast dividing cells, like  cancer cells, and causes these cells to die. This medicine is used to treat ovarian cancer, breast cancer, and other cancers. This medicine may be used for other purposes; ask your health care provider or pharmacist if you have questions. What should I tell my health care provider before I take this medicine? They need to know if you have any of these conditions: -blood disorders -irregular heartbeat -infection (especially a virus infection such as chickenpox, cold sores, or herpes) -liver disease -previous or ongoing radiation therapy -an unusual or allergic reaction to paclitaxel, alcohol, polyoxyethylated castor oil, other chemotherapy agents, other medicines, foods, dyes, or preservatives -pregnant or trying to get pregnant -breast-feeding How should I use this medicine? This drug is given as an infusion into a vein. It is administered in a hospital or clinic by a specially trained health care professional. Talk to your pediatrician regarding the use of this medicine in children. Special care may be needed. Overdosage: If you think you have taken too much of this medicine contact a poison control center or emergency room at once. NOTE: This medicine is only for you. Do not share this medicine with others. What if I miss a dose? It is important not to miss your dose. Call your doctor or health care professional if you are unable to keep an appointment. What may interact with this medicine? Do not take this medicine with any of the following medications: -disulfiram -metronidazole This medicine may also interact with the following medications: -  cyclosporine -diazepam -ketoconazole -medicines to increase blood counts like filgrastim, pegfilgrastim, sargramostim -other chemotherapy drugs like cisplatin, doxorubicin, epirubicin, etoposide, teniposide, vincristine -quinidine -testosterone -vaccines -verapamil Talk to your doctor or health care professional before taking any of these  medicines: -acetaminophen -aspirin -ibuprofen -ketoprofen -naproxen This list may not describe all possible interactions. Give your health care provider a list of all the medicines, herbs, non-prescription drugs, or dietary supplements you use. Also tell them if you smoke, drink alcohol, or use illegal drugs. Some items may interact with your medicine. What should I watch for while using this medicine? Your condition will be monitored carefully while you are receiving this medicine. You will need important blood work done while you are taking this medicine. This drug may make you feel generally unwell. This is not uncommon, as chemotherapy can affect healthy cells as well as cancer cells. Report any side effects. Continue your course of treatment even though you feel ill unless your doctor tells you to stop. This medicine can cause serious allergic reactions. To reduce your risk you will need to take other medicine(s) before treatment with this medicine. In some cases, you may be given additional medicines to help with side effects. Follow all directions for their use. Call your doctor or health care professional for advice if you get a fever, chills or sore throat, or other symptoms of a cold or flu. Do not treat yourself. This drug decreases your body's ability to fight infections. Try to avoid being around people who are sick. This medicine may increase your risk to bruise or bleed. Call your doctor or health care professional if you notice any unusual bleeding. Be careful brushing and flossing your teeth or using a toothpick because you may get an infection or bleed more easily. If you have any dental work done, tell your dentist you are receiving this medicine. Avoid taking products that contain aspirin, acetaminophen, ibuprofen, naproxen, or ketoprofen unless instructed by your doctor. These medicines may hide a fever. Do not become pregnant while taking this medicine. Women should inform their  doctor if they wish to become pregnant or think they might be pregnant. There is a potential for serious side effects to an unborn child. Talk to your health care professional or pharmacist for more information. Do not breast-feed an infant while taking this medicine. Men are advised not to father a child while receiving this medicine. This product may contain alcohol. Ask your pharmacist or healthcare provider if this medicine contains alcohol. Be sure to tell all healthcare providers you are taking this medicine. Certain medicines, like metronidazole and disulfiram, can cause an unpleasant reaction when taken with alcohol. The reaction includes flushing, headache, nausea, vomiting, sweating, and increased thirst. The reaction can last from 30 minutes to several hours. What side effects may I notice from receiving this medicine? Side effects that you should report to your doctor or health care professional as soon as possible: -allergic reactions like skin rash, itching or hives, swelling of the face, lips, or tongue -low blood counts - This drug may decrease the number of white blood cells, red blood cells and platelets. You may be at increased risk for infections and bleeding. -signs of infection - fever or chills, cough, sore throat, pain or difficulty passing urine -signs of decreased platelets or bleeding - bruising, pinpoint red spots on the skin, black, tarry stools, nosebleeds -signs of decreased red blood cells - unusually weak or tired, fainting spells, lightheadedness -breathing problems -chest pain -high or  low blood pressure -mouth sores -nausea and vomiting -pain, swelling, redness or irritation at the injection site -pain, tingling, numbness in the hands or feet -slow or irregular heartbeat -swelling of the ankle, feet, hands Side effects that usually do not require medical attention (report to your doctor or health care professional if they continue or are bothersome): -bone  pain -complete hair loss including hair on your head, underarms, pubic hair, eyebrows, and eyelashes -changes in the color of fingernails -diarrhea -loosening of the fingernails -loss of appetite -muscle or joint pain -red flush to skin -sweating This list may not describe all possible side effects. Call your doctor for medical advice about side effects. You may report side effects to FDA at 1-800-FDA-1088. Where should I keep my medicine? This drug is given in a hospital or clinic and will not be stored at home. NOTE: This sheet is a summary. It may not cover all possible information. If you have questions about this medicine, talk to your doctor, pharmacist, or health care provider.    2016, Elsevier/[employer] Standard. (2014-10-03 13:02:56)

## 2015-12-03 NOTE — Progress Notes (Signed)
Consult Note: Gyn-Onc  Leslie Rowe 80 y.o. female  CC:  Chief Complaint  Patient presents with  . Peritoneal Carcinomatosis ([facility] )    Follow up    HPI: Patient was seen in consultation at the request of Dr. Pricilla Holm.  Patient is a very pleasant 80 year old gravida 3 para 2 who has been on a variety of different medications for her diabetes. Some of intermittently caused her to have some abdominal pain as well as gastroesophageal reflux symptoms. She was placed on Januvia in September of last year. Since that time she began noticing some abdominal soreness and discomfort. Gradually increased over the summer. She subsequently stopped taking the medication in July and she thinks that the soreness improved a little bit. However, she was seen by her physician in August at that time blood work was done and everything was "okay". The pain progressively increased and started moving to different areas in her abdomen. She will bring up merely noticed the pain at night when she was unable to lay on her side and one half to lay on her back. Concomitant with all that she does endorse some decreased appetite as well as early satiety. She denies any nausea or vomiting but she does have that felt like eating. She's had no change in her bladder habits but has noticed some increased constipation and occasionally some diarrhea. She is up-to-date on her colonoscopy having had her last one 5 years ago. She had her mammogram this year. Of note her sister is currently being treated for breast cancer here at the cancer center. She is 78 years old and is undergoing radiation therapy. She had a brother who had cancer of unknown primary.  She had a CT scan performed 9/8 that revealed: FINDINGS: -Lower chest: Numerous bibasilar pulmonary nodules highly suspicious for pulmonary metastases. Nodules measure up to 10 mm LEFT image 3 and 8 mm RIGHT image 5. Small RIGHT pleural effusion. Mild RIGHT basilar  atelectasis. -Hepatobiliary: Nonspecific RIGHT lobe liver lesion 11 x 7 mm image 25. Remainder of liver normal appearance. Gallbladder unremarkable. -Pancreas: Mild pancreatic udctal dilatation at tail with a subtle low-attenuation region at pancreatic tail cannot exclude mass 19 x 8 mm. Adjacent soft tissue likely tumor in lesser sac. -Spleen: Normal appearance -Adrenals/Urinary Tract: Adrenal thickening without discrete mass. Kidneys, ureters, and bladder unremarkable. -Stomach/Bowel: Diffuse colonic diverticulosis. Distal small bowel loops on opacified and under distended limiting assessment. Food debris and contrast in stomach. -Vascular/Lymphatic: Atherosclerotic calcifications aorta. Attenuation retrocrural nodes largest 16 mm short axis image 16. RIGHT cardiophrenic angle nodes largest 8 mm short axis image 9. 8 mm gastrohepatic ligament node image 16.  -Reproductive: Uterus surgically absent. Masslike enlargement in RIGHT adnexa question RIGHT ovarian neoplasm, solid and cystic, 5.8 x 3.7 x 4.7 cm. Additional predominantly cystic mass with smaller soft tissue component in LEFT adnexa likely LEFT ovarian origin 6.1 x 2.8 x 7.0 cm. Findings could represent ovarian neoplasms or tumor deposits at the ovaries from peritoneal carcinomatosis. Diffuse omental tumor infiltration up to 3 cm thick. Additional tumor deposits are seen in the pericolic gutters bilaterally. Large tumor deposit at the cul de sac at least 4.1 cm diameter. Probable additional tumor deposits along small bowel loops and colon in pelvis as well as at the pericolic gutters.  -Other: Small amount of perihepatic free fluid. BILATERAL inguinal hernias containing fat. No free air. IMPRESSION: -Multiple pulmonary nodules bilaterally compatible with pulmonary metastases. -Peritoneal carcinomatosis with tumor deposits throughout omentum andat the pericolic  gutters as well as within the pelvis. -BILATERAL cystic and solid ovarian masses,  predominately cystic on LEFT and predominately solid on RIGHT, question ovarian neoplasmsversus tumor deposits at the ovaries bilaterally from peritonealcarcinomatosis.  Low-attenuation retrocrural nodes.  Small RIGHT pleural effusion and minimal perihepatic ascites.  Question metastasis at pancreatic tail.  Diagnosis Soft Tissue Needle Core Biopsy, Peritoneal Cavity - ADENOCARCINOMA. - SEE MICROSCOPIC DESCRIPTION.  Microscopic Comment The morphologic features are compatible with metastatic ovarian cancer. There is sufficient material for additional studies if requested. (JDP:gt, 11/17/15)  CA-125 308.  I'd spoken to the patient and her daughter extensively by phone regarding her pathology diagnosis and the need to proceed with neoadjuvant chemotherapy secondary to the pulmonary metastases. They come in today for discussion of person. The patient is otherwise doing fairly well. She is having some issues with early satiety and weight loss. They are trying to maintain her by mouth intake as well as they can. She's straining multiple small meals throughout the day. She is having some issues with constipation and they're using marrow laxatives for this. We again discussed that we will proceed with 3 cycles of paclitaxel and carboplatin. That would be able to see what her CA-125 is doing at each cycle and that hopefully after 3 cycles it'll be improving and will be to proceed with imaging and subsequently surgery. We discussed strategies to improve her appetite including multiple small meals or protein rich, the possible use of Remeron, Megace, or Marinol. Also discussed with her that when she starts her chemotherapy and as the tumor regresses she might be able to eat more as her appetite will improve. She's optimistic that this will be the case. Her daughter has relocated from the [place] area to live here with her mom and help with her brother who is autistic. Her daughter has 2 children who are  9 point a girl.   Review of Systems: Constitutional: Denies fever. Skin: No rash Cardiovascular: No chest pain, shortness of breath, or edema  Pulmonary: No cough  Gastro Intestinal: Reporting intermittent lower abdominal soreness.  No nausea, vomiting, + constipation Genitourinary: No frequency, urgency, or dysuria.  Denies vaginal bleeding and discharge.  Musculoskeletal: No joint swelling or pain.  Neurologic: No weakness.  Psychology: Worried  Current Meds:  Outpatient Encounter Prescriptions as of 12/03/2015  Medication Sig  . lisinopril-hydrochlorothiazide (PRINZIDE,ZESTORETIC) 20-12.5 MG tablet take 1 tablet by mouth once daily  . metFORMIN (GLUCOPHAGE) 1000 MG tablet take 1 tablet by mouth twice a day with food  . multivitamin (THERAGRAN) tablet Take 1 tablet by mouth daily.    . simvastatin (ZOCOR) 40 MG tablet take 1 tablet by mouth at bedtime (Patient not taking: Reported on 12/03/2015)   Facility-Administered Encounter Medications as of 12/03/2015  Medication  . 0.9 %  sodium chloride infusion    Allergy:  Allergies  Allergen Reactions  . Codeine Nausea And Vomiting    Social Hx:   Social History   Social History  . Marital status: Divorced    Spouse name: N/A  . Number of children: 2  . Years of education: 12   Occupational History  . retired Astronomer [employer]   Social History Main Topics  . Smoking status: Former Smoker    Quit date: 10/08/1975  . Smokeless tobacco: Never Used  . Alcohol use No  . Drug use: No  . Sexual activity: Not Currently   Other Topics Concern  . Not on file   Social History Narrative   HSG.  Married '60 - 62yrs/divorced. Married '80's - 61 yrs/divorced. 1 dtr - '70, 1 son - '64. 2 grandchildren. Retired - [employer]. Lives in own home, son is autistic and lives with her. Staying busy.              Past Surgical Hx:  Past Surgical History:  Procedure Laterality Date  . ABDOMINAL HYSTERECTOMY     total    Past Medical  Hx:  Past Medical History:  Diagnosis Date  . Diabetes mellitus    type II, mild  . Hyperlipidemia   . Hypertension    borderline    Oncology Hx:    Primary peritoneal carcinomatosis ([facility])   11/14/2015 Initial Diagnosis    Primary peritoneal carcinomatosis ([place]). Stage IV based on pulmonary nodules.       Family Hx:  Family History  Problem Relation Age of Onset  . Hypertension Mother   . Other Mother     CVa  . Stroke Mother   . Cancer Brother     Bone  . Other Son     Autistic  . Hypertension Father   . Stroke Father   . Hypertension Sister   . Diabetes Brother     Vitals:  Blood pressure (!) 148/70, pulse 99, temperature 98.1 F (36.7 C), temperature source Oral, resp. rate 18, height 5\' 7"  (1.702 m), weight 153 lb 4.8 oz (69.5 kg), SpO2 100 %.  Physical Exam: Well-nourished older white female in no acute distress.  Abdomen: Well-healed vertical midline incision with no evidence of an incisional hernia. Positive fluid wave. Abdomen is soft slightly distended. Nontender. There is no rebound or guarding. There is no distinct masses.  Assessment/Plan: 80 year old gravida 3 para 2 with Stage IV primary peritoneal carcinoma who is currently treated with a neoadjuvant approach. She has chemotherapy teaching class tomorrow and has her first visit with Dr. Marko Plume this coming Friday. We discussed the chemotherapy which would include paclitaxel and carboplatin and proceeding with imaging after cycle #3 to evaluate the role of interval debulking. The patient knows that she would like to proceed with a port and we will arrange for that. We also discussed the need to proceed with genetic testing. The patient and her daughter are willing to do that and we will get that scheduled accordingly.  If her symptoms don't start improving after cycle #1 she may require a therapeutic paracentesis.  Greater than 25 minutes face-to-face time was spent all of which was spent in  consultation.  I will plan on seeing her back after cycle #3 of chemotherapy.  Nancy Marus A., MD 12/03/2015, 2:21 PM

## 2015-12-04 ENCOUNTER — Other Ambulatory Visit: Payer: Medicare Other

## 2015-12-04 ENCOUNTER — Encounter: Payer: Self-pay | Admitting: *Deleted

## 2015-12-05 ENCOUNTER — Ambulatory Visit (HOSPITAL_BASED_OUTPATIENT_CLINIC_OR_DEPARTMENT_OTHER): Payer: Medicare Other | Admitting: Oncology

## 2015-12-05 ENCOUNTER — Other Ambulatory Visit (HOSPITAL_BASED_OUTPATIENT_CLINIC_OR_DEPARTMENT_OTHER): Payer: Medicare Other

## 2015-12-05 VITALS — BP 142/67 | HR 107 | Temp 98.3°F | Resp 18 | Ht 67.0 in | Wt 152.8 lb

## 2015-12-05 DIAGNOSIS — C569 Malignant neoplasm of unspecified ovary: Secondary | ICD-10-CM

## 2015-12-05 DIAGNOSIS — C801 Malignant (primary) neoplasm, unspecified: Secondary | ICD-10-CM | POA: Diagnosis not present

## 2015-12-05 DIAGNOSIS — C786 Secondary malignant neoplasm of retroperitoneum and peritoneum: Secondary | ICD-10-CM | POA: Diagnosis not present

## 2015-12-05 DIAGNOSIS — E119 Type 2 diabetes mellitus without complications: Secondary | ICD-10-CM

## 2015-12-05 DIAGNOSIS — R918 Other nonspecific abnormal finding of lung field: Secondary | ICD-10-CM

## 2015-12-05 DIAGNOSIS — K5909 Other constipation: Secondary | ICD-10-CM

## 2015-12-05 DIAGNOSIS — I878 Other specified disorders of veins: Secondary | ICD-10-CM

## 2015-12-05 LAB — COMPREHENSIVE METABOLIC PANEL
ALBUMIN: 3.6 g/dL (ref 3.5–5.0)
ALT: 11 U/L (ref 0–55)
AST: 23 U/L (ref 5–34)
Alkaline Phosphatase: 104 U/L (ref 40–150)
Anion Gap: 10 mEq/L (ref 3–11)
BUN: 16.8 mg/dL (ref 7.0–26.0)
CALCIUM: 10 mg/dL (ref 8.4–10.4)
CHLORIDE: 98 meq/L (ref 98–109)
CO2: 28 mEq/L (ref 22–29)
CREATININE: 0.9 mg/dL (ref 0.6–1.1)
EGFR: 68 mL/min/{1.73_m2} — ABNORMAL LOW (ref >90)
GLUCOSE: 333 mg/dL — AB (ref 70–140)
POTASSIUM: 4.3 meq/L (ref 3.5–5.1)
Sodium: 136 mEq/L (ref 136–145)
Total Bilirubin: 0.5 mg/dL (ref 0.20–1.20)
Total Protein: 7.5 g/dL (ref 6.4–8.3)

## 2015-12-05 LAB — CBC WITH DIFFERENTIAL/PLATELET
BASO%: 0.2 % (ref 0.0–2.0)
Basophils Absolute: 0 10*3/uL (ref 0.0–0.1)
EOS%: 0.8 % (ref 0.0–7.0)
Eosinophils Absolute: 0.1 10*3/uL (ref 0.0–0.5)
HEMATOCRIT: 34.1 % — AB (ref 34.8–46.6)
HEMOGLOBIN: 10.8 g/dL — AB (ref 11.6–15.9)
LYMPH#: 1.4 10*3/uL (ref 0.9–3.3)
LYMPH%: 16 % (ref 14.0–49.7)
MCH: 22 pg — ABNORMAL LOW (ref 25.1–34.0)
MCHC: 31.7 g/dL (ref 31.5–36.0)
MCV: 69.3 fL — ABNORMAL LOW (ref 79.5–101.0)
MONO#: 0.6 10*3/uL (ref 0.1–0.9)
MONO%: 6.9 % (ref 0.0–14.0)
NEUT%: 76.1 % (ref 38.4–76.8)
NEUTROS ABS: 6.8 10*3/uL — AB (ref 1.5–6.5)
PLATELETS: 368 10*3/uL (ref 145–400)
RBC: 4.92 10*6/uL (ref 3.70–5.45)
RDW: 14.8 % — AB (ref 11.2–14.5)
WBC: 8.9 10*3/uL (ref 3.9–10.3)

## 2015-12-05 MED ORDER — ONDANSETRON HCL 8 MG PO TABS: 8.0000 mg | ORAL_TABLET | Freq: Three times a day (TID) | ORAL | 1 refills | Status: DC | PRN

## 2015-12-05 MED ORDER — DEXAMETHASONE 4 MG PO TABS: ORAL_TABLET | ORAL | 0 refills | Status: DC

## 2015-12-05 MED ORDER — LIDOCAINE-PRILOCAINE 2.5-2.5 % EX CREA: TOPICAL_CREAM | CUTANEOUS | 1 refills | Status: DC

## 2015-12-05 MED ORDER — LORAZEPAM 0.5 MG PO TABS: ORAL_TABLET | ORAL | 0 refills | Status: DC

## 2015-12-05 NOTE — Patient Instructions (Signed)
First chemotherapy will be Friday 10-13 at 9:30   (lab before 9:30)  Night before chemo you will take 2 doses of steroids (decadron, dexamethasone):  Five tablets with food 12 hrs before chemo = ~ 9:30 PM on Thursday night 10-12, and five tablets with food ~ 3:30 AM on Friday 10-13.  We will send prescriptions for the steroids and for nausea medicines to your pharmacy.

## 2015-12-05 NOTE — Progress Notes (Signed)
[facility] NEW PATIENT EVALUATION   Name: Leslie Rowe Date: December 05, 2015  MRN: [id number] DOB: 1935-04-16  REFERRING PHYSICIAN:Paola Gehrig cc Hoyt Koch, * PCP,  (C.Gessner), (M.Norins)    REASON FOR REFERRAL:  Clinical stage IV ovarian cancer   HISTORY OF PRESENT ILLNESS:Leslie Rowe is a 80 y.o. female who is seen in consultation, together with daughter, at the request of  Dr Alycia Rossetti, for consideration of neoadjuvant chemotherapy for recently diagnosed advanced gyn carcinoma of possible ovarian primary. She saw Dr Alycia Rossetti most recently on 12-03-15  Patient has had diabetes for ~ 5 years, but otherwise has been in generally good health. She developed some mild GI symptoms in past 3-4 months, initially thought related to the diabetes medications. Symptoms of constipation, early satiety and abdominal fullness and general abdominal soreness persisted, with CT AP 11-07-15 found bibasilar pulmonary nodules and small right pleural effusion; uterus surgically absent, right adnexal solid and cystic mass 5.8 x 3.7 x 4.7 cm;  predominantly cystic mass with smaller soft tissue component in left adnexa 6.1 x 2.8 x 7.0 cm; diffuse omental tumor infiltration up to 3 cm thick; tumor deposits are seen in the pericolic gutters bilaterally; large tumor deposit at the cul de sac at least 4.1 cm diameter; probable tumor deposits along small bowel loops and colon in pelvis. CA 125 on 11-12-15 was 301.8, with CEA 8.3. She was seen by Dr Alycia Rossetti on 11-14-15, with omental biopsy 11-14-15 [id number]) adenocarcinoma consistent with metastatic ovarian. Recommendation was for neoadjuvant chemotherapy with consideration of surgery by gyn oncology after ~ cycle 3 depending on response. Genetics counseling recommended. Patient attended chemotherapy education class prior to this visit.    REVIEW OF SYSTEMS : Weight loss of 10-15 lbs since July, with decreased po intake due to early satiety and  decreased appetite, likely not optimal po fluids. No nausea or vomiting. "Very constipated x 3-4 weeks" tho bowels are moving small amount, will increase miralax. No SOB, cough, chest pain. Less active but still up thru day. No fever. No bleeding, Bladder ok. No LE swelling. No HA , slightly dizzy once when up. No environmental allergies. Some decreased hearing related to work noise x 30 years. No known dental problems, partial dentures. No thyroid problems known. No arthritis. No history blood clots. No peripheral neuropathy. Checks blood sugars at home "~ 180".  Remainder of full 10 point review of systems negative.    No central line Flu vaccine 10-28-15 CA 125    469 on 12-05-15 Genetics counseling recommended, not yet scheduled   ALLERGIES: Codeine causes nausea  PAST MEDICAL/ SURGICAL HISTORY:    G3P2 Type 2 DM x ~ 5 years, on Januvia in past year subsequently stopped with GI symptoms, on metformin 1000 mg bid. Checks blood sugars at home Colonoscopy 01-2007 [facility] Mammograms 05-14-15 Surgery By [facility] Hysterectomy without oophorectomy ~ 1970 Bunion surgery Flu vaccine 10-28-15 "anemic all of my life" History heart murmur x years per patient. EKG 2012 NSR  CURRENT MEDICATIONS: reviewed as listed now in EMR Scripts to pharmacy:  Decadron 4 mg    Five tablets with food 12 hrs and 6 hrs prior to chemo  #10 zofran 8 mg every 8 hrs prn nausea from chemo. Will not make drowsy   #30  1 RF Lorazepam  0.5 mg   SL or po every 6 hrs prn nausea  Will make drowsy  RN to do phone call shortly prior to first chemo 10-13 to review meds.  SOCIAL HISTORY:  From [place], divorced, retired from [employer] after 30 years in ~ 1989. Stopped smoking 1977. No ETOH or drugs.  Autistic son lives with her, daughter and 45 year old twin grands (boy and girl) have moved in with her in past week from [place]. Children are in 4th grade, began new school this week, school day 0745 to 1415 but can stay in afterschool  care if needed.   FAMILY HISTORY:  Sister age 64 just completed radiation at [facility] for breast cancer Brother prostate cancer, brother lung or bone cancer.  Mother HTN, CVA, died with aneurysm Father HTN, CVA Siblings HTN, DM Son autism Daughter healthy, grands healthy    PHYSICAL EXAM:  height is 5\' 7"  (1.702 m) and weight is 152 lb 12.8 oz (69.3 kg). Her oral temperature is 98.3 F (36.8 C). Her blood pressure is 142/67 (abnormal) and her pulse is 107 (abnormal). Her respiration is 18 and oxygen saturation is 98%.  Alert, pleasant, cooperative lady, looks younger than stated age. Good historian. Easily mobile and ambulatory, respirations not labored, not in any acute discomfort.   HEENT: PERRL, not icteric. Oral mucosa moist and clear, no obvious dental concerns, posterior pharynx clear. Neck supple without JVD or thyroid mass  RESPIRATORY: lungs clear to A and P including BS to right base  CARDIAC/ VASCULAR: heart RRR no gallop. Peripheral pulses intact and symmetrical. Peripheral veins bilateral UE small  ABDOMEN: full especially upper abdomen but not markedly distended, no fluid wave. Not specifically tender. Cannot appreciate HSM or mass. Few BS scattered.  LYMPH NODES: no cervical, supraclavicular, axillary or inguinal adenopathy  BREASTS: bilaterally without dominant mass, skin or nipple findings of concern. Axillae not remarkable  NEUROLOGIC: Decreased hearing, tho easily able to understand conversation in quiet room. CN intact otherwise, motor/ sensory/ cerebellar nonfocal. PSYCH appropriate mood and affect  SKIN: without rash, ecchymosis, petechiae  MUSCULOSKELETAL: back not tender. LE no edema, cords, tenderness. Symmetrical muscle mass UE/ LE    LABORATORY DATA:  Results for orders placed or performed in visit on 12/05/15 (from the past 48 hour(s))  CBC with Differential     Status: Abnormal   Collection Time: 12/05/15 10:58 AM  Result Value Ref Range   WBC 8.9  3.9 - 10.3 10e3/uL   NEUT# 6.8 (H) 1.5 - 6.5 10e3/uL   HGB 10.8 (L) 11.6 - 15.9 g/dL   HCT 34.1 (L) 34.8 - 46.6 %   Platelets 368 145 - 400 10e3/uL   MCV 69.3 (L) 79.5 - 101.0 fL   MCH 22.0 (L) 25.1 - 34.0 pg   MCHC 31.7 31.5 - 36.0 g/dL   RBC 4.92 3.70 - 5.45 10e6/uL   RDW 14.8 (H) 11.2 - 14.5 %   lymph# 1.4 0.9 - 3.3 10e3/uL   MONO# 0.6 0.1 - 0.9 10e3/uL   Eosinophils Absolute 0.1 0.0 - 0.5 10e3/uL   Basophils Absolute 0.0 0.0 - 0.1 10e3/uL   NEUT% 76.1 38.4 - 76.8 %   LYMPH% 16.0 14.0 - 49.7 %   MONO% 6.9 0.0 - 14.0 %   EOS% 0.8 0.0 - 7.0 %   BASO% 0.2 0.0 - 2.0 %    CMET:  Na 136, K 4.3, Cl 98, CO2 28, glucose nonfasting 333, BUN 16.8, creat 0.9, T bili 0.5, AP 104, AST 23, ALT 11, Tprot 7.5, alb 3.6, Ca 10  CA 125 available after visit 469, baseline for this chemotherapy  PATHOLOGY: FINAL for Leslie Rowe, Leslie Rowe [id number]) Patient: Lindalou Hose  M Collected: 11/14/2015 Client: [place] Accession: [id number] Received: 11/14/2015 Shellia Cleverly OF SURGICAL PATHOLOGY FINAL DIAGNOSIS Diagnosis Soft Tissue Needle Core Biopsy, Peritoneal Cavity - ADENOCARCINOMA. - SEE MICROSCOPIC DESCRIPTION. Microscopic Comment The morphologic features are compatible with metastatic ovarian cancer. There is sufficient material for additional studies if requested.  RADIOGRAPHY: CT ABDOMEN AND PELVIS WITH CONTRAST  11-07-15  TECHNIQUE: Multidetector CT imaging of the abdomen and pelvis was performed using the standard protocol following bolus administration of intravenous contrast. Sagittal and coronal MPR images reconstructed from axial data set.  CONTRAST:  148mL ISOVUE-300 IOPAMIDOL (ISOVUE-300) INJECTION 61% IV. Dilute oral contrast.  COMPARISON:  None  FINDINGS: Lower chest: Numerous bibasilar pulmonary nodules highly suspicious for pulmonary metastases. Nodules measure up to 10 mm LEFT image 3 and 8 mm RIGHT image 5. Small RIGHT pleural effusion. Mild  RIGHT basilar atelectasis.  Hepatobiliary: Nonspecific RIGHT lobe liver lesion 11 x 7 mm image 25. Remainder of liver normal appearance. Gallbladder unremarkable.  Pancreas: Mild pancreatic udctal dilatation at tail with a subtle low-attenuation region at pancreatic tail cannot exclude mass 19 x 8 mm. Adjacent soft tissue likely tumor in lesser sac.  Spleen: Normal appearance  Adrenals/Urinary Tract: Adrenal thickening without discrete mass. Kidneys, ureters, and bladder unremarkable.  Stomach/Bowel: Diffuse colonic diverticulosis. Distal small bowel loops on opacified and under distended limiting assessment. Food debris and contrast in stomach.  Vascular/Lymphatic: Atherosclerotic calcifications aorta. Attenuation retrocrural nodes largest 16 mm short axis image 16. RIGHT cardiophrenic angle nodes largest 8 mm short axis image 9. 8 mm gastrohepatic ligament node image 16.  Reproductive: Uterus surgically absent. Masslike enlargement in RIGHT adnexa question RIGHT ovarian neoplasm, solid and cystic, 5.8 x 3.7 x 4.7 cm. Additional predominantly cystic mass with smaller soft tissue component in LEFT adnexa likely LEFT ovarian origin 6.1 x 2.8 x 7.0 cm. Findings could represent ovarian neoplasms or tumor deposits at the ovaries from peritoneal carcinomatosis. Diffuse omental tumor infiltration up to 3 cm thick. Additional tumor deposits are seen in the pericolic gutters bilaterally. Large tumor deposit at the cul de sac at least 4.1 cm diameter. Probable additional tumor deposits along small bowel loops and colon in pelvis as well as at the pericolic gutters.  Other: Small amount of perihepatic free fluid. BILATERAL inguinal hernias containing fat. No free air.  Musculoskeletal: No acute osseous findings.  IMPRESSION: Multiple pulmonary nodules bilaterally compatible with pulmonary metastases.  Peritoneal carcinomatosis with tumor deposits throughout omentum  and at the pericolic gutters as well as within the pelvis.  BILATERAL cystic and solid ovarian masses, predominately cystic on LEFT and predominately solid on RIGHT, question ovarian neoplasms versus tumor deposits at the ovaries bilaterally from peritoneal carcinomatosis.  Low-attenuation retrocrural nodes.  Small RIGHT pleural effusion and minimal perihepatic ascites.  Question metastasis at pancreatic tail.  Sigmoid diverticulosis.  BILATERAL inguinal hernias containing fat.  Aortic atherosclerosis   PACs images of CT reviewed with patient and daughter    DISCUSSION: All of history above reviewed with patient and daughter, including circumstances surrounding diagnosis, findings of CT and omental biopsy, rationale for neoadjuvant chemotherapy. We have discussed bilateral lower field pulmonary nodules seen on CT AP, no prior for comparison tho these look suspicious for metastatic disease. Will get dedicated CT chest for baseline and will follow response to systemic treatment. We have discussed treatment in curative attempt vs treatment in attempt to control disease, as situation with possible metastatic cancer to lungs not clear. I have explained that carboplatin and taxol  can be beneficial with many cases of gyn cancer even if not curative. They are comfortable with information given about carboplatin and taxol in chemotherapy education class. I have reviewed need for steroid premedication with taxol, to decrease chance of allergic reaction; this will make blood sugars transiently higher, will cover with SS regular insulin at time of treatment, will decrease decadron dose for subsequent cycles if no taxol reaction cycle 1, will let PCP know as patient may need more coverage around chemo treatments. Patient is comfortable checking blood sugars; we have reviewed pushing unsweetened fluids in addition to medications if BS high.  Discussed possible taxol aches. Recommended claritin  beginning day of chemo x 4-5 days. OK to use tylenol, heat. Narcotics generally not very helpful and increase constipation. Mentioned peripheral neuropathy from taxol can be more of an issue with underlying diabetes, tho fortunately no preexisting diabetic neuropathy apparent.  Discussed need for good nutrition, including small meals frequently if early satiety is problem. Will ask [place] nutritionist to follow up. She is using "diabetic Boost". Discussed antiemetics for chemo. Discussed increasing bowel meds with goal of good bowel movement daily; note extensive stool seen on CT. No symptoms of bowel obstruction now. Discussed IV access for chemo. Peripheral veins are likely not adequate and she would prefer PAC, tho may be able to give cycle 1 by peripheral access if not able to get PAC placed in next few days.   Verbal consent given for chemotherapy.   IMPRESSION / PLAN:  1. Peritoneal carcinomatosis: primary peritoneal vs ovarian, possibly metastatic to lungs. Symptomatic from abdominal disease tho no bowel obstruction. Plan initial treatment with carboplatin taxol starting within the week. CT chest next available. Expect to reimage CAP after cycle 3, consider interval surgery by gyn oncology then. I will see her back with labs 1 week and 2 weeks after chemo. Follow CA 125 with treatment. 2.Diabetes on metformin: blood sugar today 333 not fasting, but particularly of concern with steroids needed around taxol. She does follow blood sugars at home, previously on Januvia [facility] with GI symptoms, not clear in retrospect if symptoms were medication related). WIll cover with SS insulin at time of chemotherapy infusion; I will ask Dr Sharlet Salina to give recommendations for increased coverage at home. [place] dietician to see due to suboptimal nutritional intake, unintentional weight loss and hyperglycemia.  3.peripheral venous access not adequate for all of anticipated chemo, tho may be able to start with this until [facility]  can be placed. Request to IR for PAC now. 4. Constipation: likely related to peritoneal carcinomatosis. Will increase miralax to bid. Will ask RN to follow up by phone next week.  5.post hysterectomy without oophorectomy ~ 1970 6.flu vaccine 10-28-15 7.work noise exposure in past with decreased hearing 8.minimal past tobacco, DCd 1977. Second hand tobacco exposure. 9.Living WIll and HCPOA in place   Patient and daughter have had questions answered to their satisfaction and are in agreement with plan above. They can contact this office for questions or concerns at any time prior to next scheduled visit. First available time for first taxol Norma Fredrickson is 10-13. Chemo orders placed using [facility] pathways. [facility] requested. Message to [facility] managed care for preauth. Request for [facility] and [place]. Message to PCP re blood sugar management as above. Message to collaborative RNs. CC Drs Alycia Rossetti and Sharlet Salina. Time spent 70 min, including >50% discussion and coordination of care.    Evlyn Clines, MD 12/05/2015 11:37 AM

## 2015-12-06 LAB — CA 125: Cancer Antigen (CA) 125: 469.2 U/mL — ABNORMAL HIGH (ref 0.0–38.1)

## 2015-12-07 ENCOUNTER — Encounter: Payer: Self-pay | Admitting: Oncology

## 2015-12-07 ENCOUNTER — Other Ambulatory Visit: Payer: Self-pay | Admitting: Oncology

## 2015-12-07 DIAGNOSIS — C786 Secondary malignant neoplasm of retroperitoneum and peritoneum: Secondary | ICD-10-CM

## 2015-12-07 DIAGNOSIS — C801 Malignant (primary) neoplasm, unspecified: Principal | ICD-10-CM

## 2015-12-07 DIAGNOSIS — R918 Other nonspecific abnormal finding of lung field: Secondary | ICD-10-CM | POA: Insufficient documentation

## 2015-12-07 DIAGNOSIS — E119 Type 2 diabetes mellitus without complications: Secondary | ICD-10-CM

## 2015-12-07 NOTE — Progress Notes (Signed)
START ON PATHWAY REGIMEN - Ovarian  OVOS44: Carboplatin AUC=6 + Paclitaxel 175 mg/m2 q21 Days x 2-4 Cycles   A cycle is every 21 days:     Paclitaxel (Taxol(R)) 175 mg/m2 in 500 mL NS IV over 3 hours day 1 Dose Mod: None     Carboplatin (Paraplatin(R)) AUC=6 in 250 mL NS IV over 1 hour day 1 Dose Mod: None Additional Orders: * All AUC calculations intended to be used in [place] formula  **Always confirm dose/schedule in your pharmacy ordering system**    Patient Characteristics: Neoadjuvant Therapy (No Primary Cytoreductive Surgery) Stage Grouping: IV AJCC T Stage: X AJCC N Stage: X AJCC M Stage: X Line of Therapy: Neoadjuvant Therapy BRCA Mutation Status: Awaiting Test Results Would you be surprised if this patient died  in the next year? I would be surprised if this patient died in the next year  Intent of Therapy: Non-Curative / Palliative Intent, Discussed with Patient 

## 2015-12-09 ENCOUNTER — Telehealth: Payer: Self-pay | Admitting: Internal Medicine

## 2015-12-09 ENCOUNTER — Ambulatory Visit (HOSPITAL_COMMUNITY)
Admission: RE | Admit: 2015-12-09 | Discharge: 2015-12-09 | Disposition: A | Discharge status: Home / Self Care | Payer: Medicare Other | Source: Ambulatory Visit | Attending: Oncology | Admitting: Oncology

## 2015-12-09 ENCOUNTER — Other Ambulatory Visit: Payer: Self-pay | Admitting: General Surgery

## 2015-12-09 ENCOUNTER — Other Ambulatory Visit: Payer: Self-pay | Admitting: Radiology

## 2015-12-09 ENCOUNTER — Telehealth: Payer: Self-pay

## 2015-12-09 DIAGNOSIS — R188 Other ascites: Secondary | ICD-10-CM | POA: Insufficient documentation

## 2015-12-09 DIAGNOSIS — J90 Pleural effusion, not elsewhere classified: Secondary | ICD-10-CM | POA: Diagnosis not present

## 2015-12-09 DIAGNOSIS — C569 Malignant neoplasm of unspecified ovary: Secondary | ICD-10-CM | POA: Diagnosis present

## 2015-12-09 DIAGNOSIS — R918 Other nonspecific abnormal finding of lung field: Secondary | ICD-10-CM | POA: Insufficient documentation

## 2015-12-09 DIAGNOSIS — C786 Secondary malignant neoplasm of retroperitoneum and peritoneum: Secondary | ICD-10-CM | POA: Diagnosis not present

## 2015-12-09 MED ORDER — IOPAMIDOL (ISOVUE-300) INJECTION 61%
75.0000 mL | Freq: Once | INTRAVENOUS | Status: AC | PRN
  Administered 2015-12-09: 75 mL via INTRAVENOUS

## 2015-12-09 MED ORDER — INSULIN LISPRO 100 UNIT/ML (KWIKPEN): PEN_INJECTOR | SUBCUTANEOUS | 11 refills | Status: DC

## 2015-12-09 NOTE — Telephone Encounter (Signed)
Spoke to patient and her daughter. She is aware of instructions. She will come in for teaching if needed and a copy of instructions have been placed at the front for pick up. She has a glucometer and will check her sugar.

## 2015-12-09 NOTE — Telephone Encounter (Signed)
Ms Peitz has been taking the Miralax ~ daily and bowels are moving better.

## 2015-12-09 NOTE — Telephone Encounter (Signed)
Please call patient and inform her that we are sending in an insulin pen so that she can use it around the chemotherapy since she will need to be on steroid medicine. If she does not have a meter please send one in for her.  Instructions to check sugar before meals and at bedtime and using the following scale: Check sugars and if:  0-150 take no insulin,  151-250 take 2 units,  251-300 take 4 units,  301-400, take 6 units,  >400 call office.  If any confusion she can call or come in. If she needs teaching for the insulin pen she can come in to office for that. If sugars are consistently >300 she should call the office for advice.

## 2015-12-09 NOTE — Telephone Encounter (Signed)
-----  Message from Gordy Levan, MD sent at 12/07/2015 11:17 AM EDT ----- [facility] patient, to start chemo for gyn cancer on 12-12-15. She will have decadron 20 mg 12 hrs and 6 hrs prior to chemo, + IV decadron time of chemo.  I will give SS regular insulin and IVF with chemo. Blood sugar not fasting 330 when I met her on 10-6, presently on metformin 1000 bid.  She likely will need some additional SS coverage at home at least evening of chemo and next day of so. Could you help with this?  Please let me know if questions. My consult note in EMR and hopefully routed to you.  Thank you Evlyn Clines

## 2015-12-09 NOTE — Telephone Encounter (Signed)
Reviewed steroids and and antiemetics with daughter Dorris Carnes as noted below. Pt to take ativan the night of chemo and the zofran tablet the morning after the treatment whether nauseous or not to keep an antiemetic in her system. Then use as needed. Claritin 10 mg tabs for Taxol  aches daily for 5- 7 days beginning 12-11-15. Use of heat for aches as well as Tylenol or Ibuprofen  Reviewed application of EMLA cream on PAC site prior to treatment. Daughter verbalized understanding. She knows to call [phone] if any questins or concerns prior to treatment 12-12-15.

## 2015-12-09 NOTE — Telephone Encounter (Signed)
Decadron 4 mg Five tablets with food 12 hrs and 6 hrs prior to chemo. #10 zofran 8 mg every 8 hrs prn nausea from chemo. Will not make drowsy  #30 1 RF  Lorazepam 0.5 mg  SL or po every 6 hrs prn nausea Will make drowsy   RN please call her shortly prior to first chemo 10-13 to review meds.   She is to have CT chest and PAC either before or after first chemo   thanks

## 2015-12-09 NOTE — Telephone Encounter (Signed)
-----   Message from Gordy Levan, MD sent at 12/07/2015 11:21 AM EDT ----- See message to RN from 10-5 also Please also ask her about constipation, hopefully to improve this prior to first chemo 10-13  Thank you

## 2015-12-10 ENCOUNTER — Ambulatory Visit (HOSPITAL_COMMUNITY)
Admission: RE | Admit: 2015-12-10 | Discharge: 2015-12-10 | Disposition: A | Discharge status: Home / Self Care | Payer: Medicare Other | Source: Ambulatory Visit | Attending: Oncology | Admitting: Oncology

## 2015-12-10 ENCOUNTER — Telehealth: Payer: Self-pay | Admitting: *Deleted

## 2015-12-10 ENCOUNTER — Other Ambulatory Visit: Payer: Self-pay | Admitting: Oncology

## 2015-12-10 DIAGNOSIS — C569 Malignant neoplasm of unspecified ovary: Secondary | ICD-10-CM | POA: Diagnosis not present

## 2015-12-10 LAB — GLUCOSE, CAPILLARY: Glucose-Capillary: 244 mg/dL — ABNORMAL HIGH (ref 65–99)

## 2015-12-10 MED ORDER — SODIUM CHLORIDE 0.9 % IV SOLN: INTRAVENOUS | Status: DC

## 2015-12-10 MED ORDER — CEFAZOLIN SODIUM-DEXTROSE 2-4 GM/100ML-% IV SOLN
2.0000 g | INTRAVENOUS | Status: DC
  Filled 2015-12-10: qty 100

## 2015-12-10 NOTE — Telephone Encounter (Signed)
Spoke with dtr. Fenecia and told her to apply the EMLA cream prior to visit on 12-18-15 incase the blood is able to be drawn from the [facility] that day if the flush appointment can be added.  If not, the blood will be drawn from her arm. Daughter verbalized understanding.

## 2015-12-10 NOTE — Progress Notes (Signed)
Patient ate oatmeal and had juice and water at 1000 this am. Informed Rowe Robert PA. Patient will have to be rescheduled. Awaiting new time and will inform patient and send her home.

## 2015-12-10 NOTE — Discharge Instructions (Signed)

## 2015-12-10 NOTE — Telephone Encounter (Addendum)
Daughter stated that her mother was unable to have her post placed due to the fact that she ate breakfast before her visit. However, she stated that her mother still wants to get her TMT on 10/13 as planed

## 2015-12-10 NOTE — Progress Notes (Signed)
Patient rescheduled with IR for port placement. Paper with time and NPO instructions from IR given to patient. Patient and daughter aware of future appointment. Discharged home.

## 2015-12-10 NOTE — Telephone Encounter (Signed)
Spoke with Patient and daughter.  They came to [facility] to see about treatment 12-12-15. Told them that the treatment on 12-12-15 can be done peripherally per Dr. Marko Plume. PAC is r/s to Thursday 01-01-16 at 1130 with arrival at 0930 at short stay. NPO after MN 12-31-15. Due at this time for D1 Cycle 2 on 01-02-16. Patient and daughter verbalized understanding.

## 2015-12-12 ENCOUNTER — Ambulatory Visit: Payer: Medicare Other

## 2015-12-12 ENCOUNTER — Ambulatory Visit (HOSPITAL_BASED_OUTPATIENT_CLINIC_OR_DEPARTMENT_OTHER): Payer: Medicare Other

## 2015-12-12 ENCOUNTER — Other Ambulatory Visit (HOSPITAL_BASED_OUTPATIENT_CLINIC_OR_DEPARTMENT_OTHER): Payer: Medicare Other

## 2015-12-12 ENCOUNTER — Other Ambulatory Visit (HOSPITAL_COMMUNITY): Payer: Medicare Other

## 2015-12-12 VITALS — BP 130/75 | HR 97 | Temp 98.6°F | Resp 20

## 2015-12-12 DIAGNOSIS — C801 Malignant (primary) neoplasm, unspecified: Secondary | ICD-10-CM

## 2015-12-12 DIAGNOSIS — C786 Secondary malignant neoplasm of retroperitoneum and peritoneum: Secondary | ICD-10-CM

## 2015-12-12 DIAGNOSIS — Z5111 Encounter for antineoplastic chemotherapy: Secondary | ICD-10-CM

## 2015-12-12 DIAGNOSIS — E119 Type 2 diabetes mellitus without complications: Secondary | ICD-10-CM

## 2015-12-12 DIAGNOSIS — C569 Malignant neoplasm of unspecified ovary: Secondary | ICD-10-CM

## 2015-12-12 DIAGNOSIS — C482 Malignant neoplasm of peritoneum, unspecified: Secondary | ICD-10-CM

## 2015-12-12 LAB — COMPREHENSIVE METABOLIC PANEL
ALT: 20 U/L (ref 0–55)
ANION GAP: 14 meq/L — AB (ref 3–11)
AST: 30 U/L (ref 5–34)
Albumin: 3.6 g/dL (ref 3.5–5.0)
Alkaline Phosphatase: 108 U/L (ref 40–150)
BILIRUBIN TOTAL: 0.43 mg/dL (ref 0.20–1.20)
BUN: 20.8 mg/dL (ref 7.0–26.0)
CHLORIDE: 99 meq/L (ref 98–109)
CO2: 23 meq/L (ref 22–29)
Calcium: 10.1 mg/dL (ref 8.4–10.4)
Creatinine: 1.1 mg/dL (ref 0.6–1.1)
EGFR: 54 mL/min/{1.73_m2} — AB (ref >90)
GLUCOSE: 457 mg/dL — AB (ref 70–140)
POTASSIUM: 4.6 meq/L (ref 3.5–5.1)
SODIUM: 136 meq/L (ref 136–145)
TOTAL PROTEIN: 7.6 g/dL (ref 6.4–8.3)

## 2015-12-12 LAB — CBC WITH DIFFERENTIAL/PLATELET
BASO%: 0.4 % (ref 0.0–2.0)
Basophils Absolute: 0 10*3/uL (ref 0.0–0.1)
EOS ABS: 0 10*3/uL (ref 0.0–0.5)
EOS%: 0.3 % (ref 0.0–7.0)
HCT: 34.7 % — ABNORMAL LOW (ref 34.8–46.6)
HGB: 10.8 g/dL — ABNORMAL LOW (ref 11.6–15.9)
LYMPH%: 8.6 % — AB (ref 14.0–49.7)
MCH: 21.7 pg — ABNORMAL LOW (ref 25.1–34.0)
MCHC: 31.1 g/dL — AB (ref 31.5–36.0)
MCV: 69.7 fL — AB (ref 79.5–101.0)
MONO#: 0 10*3/uL — ABNORMAL LOW (ref 0.1–0.9)
MONO%: 0.3 % (ref 0.0–14.0)
NEUT%: 90.4 % — AB (ref 38.4–76.8)
NEUTROS ABS: 8.5 10*3/uL — AB (ref 1.5–6.5)
Platelets: 364 10*3/uL (ref 145–400)
RBC: 4.97 10*6/uL (ref 3.70–5.45)
RDW: 14.8 % — ABNORMAL HIGH (ref 11.2–14.5)
WBC: 9.4 10*3/uL (ref 3.9–10.3)
lymph#: 0.8 10*3/uL — ABNORMAL LOW (ref 0.9–3.3)

## 2015-12-12 LAB — WHOLE BLOOD GLUCOSE
Glucose: 266 mg/dL — ABNORMAL HIGH (ref 70–100)
HRS PC: 2.5 h

## 2015-12-12 MED ORDER — SODIUM CHLORIDE 0.9 % IV SOLN
Freq: Once | INTRAVENOUS | Status: AC
  Administered 2015-12-12: 10:00:00 via INTRAVENOUS

## 2015-12-12 MED ORDER — FAMOTIDINE IN NACL 20-0.9 MG/50ML-% IV SOLN
20.0000 mg | Freq: Once | INTRAVENOUS | Status: AC
  Administered 2015-12-12: 20 mg via INTRAVENOUS

## 2015-12-12 MED ORDER — INSULIN REGULAR HUMAN 100 UNIT/ML IJ SOLN
2.0000 [IU] | Freq: Once | INTRAMUSCULAR | Status: AC | PRN
  Administered 2015-12-12: 8 [IU] via SUBCUTANEOUS
  Filled 2015-12-12: qty 0.08

## 2015-12-12 MED ORDER — DIPHENHYDRAMINE HCL 50 MG/ML IJ SOLN
INTRAMUSCULAR | Status: AC
  Filled 2015-12-12: qty 1

## 2015-12-12 MED ORDER — DIPHENHYDRAMINE HCL 50 MG/ML IJ SOLN
50.0000 mg | Freq: Once | INTRAMUSCULAR | Status: AC
Start: 2015-12-12 — End: 2015-12-12
  Administered 2015-12-12: 50 mg via INTRAVENOUS

## 2015-12-12 MED ORDER — SODIUM CHLORIDE 0.9 % IV SOLN
Freq: Once | INTRAVENOUS | Status: AC
  Administered 2015-12-12: 11:00:00 via INTRAVENOUS
  Filled 2015-12-12: qty 4

## 2015-12-12 MED ORDER — FAMOTIDINE IN NACL 20-0.9 MG/50ML-% IV SOLN
INTRAVENOUS | Status: AC
  Filled 2015-12-12: qty 50

## 2015-12-12 MED ORDER — INSULIN REGULAR HUMAN 100 UNIT/ML IJ SOLN
6.0000 [IU] | Freq: Once | INTRAMUSCULAR | Status: AC
  Administered 2015-12-12: 6 [IU] via SUBCUTANEOUS
  Filled 2015-12-12: qty 0.06

## 2015-12-12 MED ORDER — SODIUM CHLORIDE 0.9 % IV SOLN
175.0000 mg/m2 | Freq: Once | INTRAVENOUS | Status: AC
  Administered 2015-12-12: 318 mg via INTRAVENOUS
  Filled 2015-12-12: qty 53

## 2015-12-12 MED ORDER — SODIUM CHLORIDE 0.9 % IV SOLN
426.6000 mg | Freq: Once | INTRAVENOUS | Status: AC
  Administered 2015-12-12: 430 mg via INTRAVENOUS
  Filled 2015-12-12: qty 43

## 2015-12-12 MED ORDER — SODIUM CHLORIDE 0.9 % IV SOLN
Freq: Once | INTRAVENOUS | Status: AC
  Administered 2015-12-12: 11:00:00 via INTRAVENOUS
  Filled 2015-12-12: qty 5

## 2015-12-12 NOTE — Patient Instructions (Signed)
[facility] Discharge Instructions for Patients Receiving Chemotherapy  Today you received the following chemotherapy agents Taxol and Carboplatin  To help prevent nausea and vomiting after your treatment, we encourage you to take your nausea medication as directed.   If you develop nausea and vomiting that is not controlled by your nausea medication, call the clinic.   BELOW ARE SYMPTOMS THAT SHOULD BE REPORTED IMMEDIATELY:  *FEVER GREATER THAN 100.5 F  *CHILLS WITH OR WITHOUT FEVER  NAUSEA AND VOMITING THAT IS NOT CONTROLLED WITH YOUR NAUSEA MEDICATION  *UNUSUAL SHORTNESS OF BREATH  *UNUSUAL BRUISING OR BLEEDING  TENDERNESS IN MOUTH AND THROAT WITH OR WITHOUT PRESENCE OF ULCERS  *URINARY PROBLEMS  *BOWEL PROBLEMS  UNUSUAL RASH Items with * indicate a potential emergency and should be followed up as soon as possible.  Feel free to call the clinic you have any questions or concerns. The clinic phone number is [phone].  Please show the CHEMO ALERT CARD at check-in to the Emergency Department and triage nurse.      Paclitaxel injection What is this medicine? PACLITAXEL (PAK li TAX el) is a chemotherapy drug. It targets fast dividing cells, like cancer cells, and causes these cells to die. This medicine is used to treat ovarian cancer, breast cancer, and other cancers. This medicine may be used for other purposes; ask your health care provider or pharmacist if you have questions. What should I tell my health care provider before I take this medicine? They need to know if you have any of these conditions: -blood disorders -irregular heartbeat -infection (especially a virus infection such as chickenpox, cold sores, or herpes) -liver disease -previous or ongoing radiation therapy -an unusual or allergic reaction to paclitaxel, alcohol, polyoxyethylated castor oil, other chemotherapy agents, other medicines, foods, dyes, or preservatives -pregnant or  trying to get pregnant -breast-feeding How should I use this medicine? This drug is given as an infusion into a vein. It is administered in a hospital or clinic by a specially trained health care professional. Talk to your pediatrician regarding the use of this medicine in children. Special care may be needed. Overdosage: If you think you have taken too much of this medicine contact a poison control center or emergency room at once. NOTE: This medicine is only for you. Do not share this medicine with others. What if I miss a dose? It is important not to miss your dose. Call your doctor or health care professional if you are unable to keep an appointment. What may interact with this medicine? Do not take this medicine with any of the following medications: -disulfiram -metronidazole This medicine may also interact with the following medications: -cyclosporine -diazepam -ketoconazole -medicines to increase blood counts like filgrastim, pegfilgrastim, sargramostim -other chemotherapy drugs like cisplatin, doxorubicin, epirubicin, etoposide, teniposide, vincristine -quinidine -testosterone -vaccines -verapamil Talk to your doctor or health care professional before taking any of these medicines: -acetaminophen -aspirin -ibuprofen -ketoprofen -naproxen This list may not describe all possible interactions. Give your health care provider a list of all the medicines, herbs, non-prescription drugs, or dietary supplements you use. Also tell them if you smoke, drink alcohol, or use illegal drugs. Some items may interact with your medicine. What should I watch for while using this medicine? Your condition will be monitored carefully while you are receiving this medicine. You will need important blood work done while you are taking this medicine. This drug may make you feel generally unwell. This is not uncommon, as chemotherapy can affect   healthy cells as well as cancer cells. Report any side  effects. Continue your course of treatment even though you feel ill unless your doctor tells you to stop. This medicine can cause serious allergic reactions. To reduce your risk you will need to take other medicine(s) before treatment with this medicine. In some cases, you may be given additional medicines to help with side effects. Follow all directions for their use. Call your doctor or health care professional for advice if you get a fever, chills or sore throat, or other symptoms of a cold or flu. Do not treat yourself. This drug decreases your body's ability to fight infections. Try to avoid being around people who are sick. This medicine may increase your risk to bruise or bleed. Call your doctor or health care professional if you notice any unusual bleeding. Be careful brushing and flossing your teeth or using a toothpick because you may get an infection or bleed more easily. If you have any dental work done, tell your dentist you are receiving this medicine. Avoid taking products that contain aspirin, acetaminophen, ibuprofen, naproxen, or ketoprofen unless instructed by your doctor. These medicines may hide a fever. Do not become pregnant while taking this medicine. Women should inform their doctor if they wish to become pregnant or think they might be pregnant. There is a potential for serious side effects to an unborn child. Talk to your health care professional or pharmacist for more information. Do not breast-feed an infant while taking this medicine. Men are advised not to father a child while receiving this medicine. This product may contain alcohol. Ask your pharmacist or healthcare provider if this medicine contains alcohol. Be sure to tell all healthcare providers you are taking this medicine. Certain medicines, like metronidazole and disulfiram, can cause an unpleasant reaction when taken with alcohol. The reaction includes flushing, headache, nausea, vomiting, sweating, and increased  thirst. The reaction can last from 30 minutes to several hours. What side effects may I notice from receiving this medicine? Side effects that you should report to your doctor or health care professional as soon as possible: -allergic reactions like skin rash, itching or hives, swelling of the face, lips, or tongue -low blood counts - This drug may decrease the number of white blood cells, red blood cells and platelets. You may be at increased risk for infections and bleeding. -signs of infection - fever or chills, cough, sore throat, pain or difficulty passing urine -signs of decreased platelets or bleeding - bruising, pinpoint red spots on the skin, black, tarry stools, nosebleeds -signs of decreased red blood cells - unusually weak or tired, fainting spells, lightheadedness -breathing problems -chest pain -high or low blood pressure -mouth sores -nausea and vomiting -pain, swelling, redness or irritation at the injection site -pain, tingling, numbness in the hands or feet -slow or irregular heartbeat -swelling of the ankle, feet, hands Side effects that usually do not require medical attention (report to your doctor or health care professional if they continue or are bothersome): -bone pain -complete hair loss including hair on your head, underarms, pubic hair, eyebrows, and eyelashes -changes in the color of fingernails -diarrhea -loosening of the fingernails -loss of appetite -muscle or joint pain -red flush to skin -sweating This list may not describe all possible side effects. Call your doctor for medical advice about side effects. You may report side effects to FDA at 1-800-FDA-1088. Where should I keep my medicine? This drug is given in a hospital or clinic and will   not be stored at home. NOTE: This sheet is a summary. It may not cover all possible information. If you have questions about this medicine, talk to your doctor, pharmacist, or health care provider.    2016,  [employer] Standard. (2014-10-03 13:02:56)     Carboplatin injection What is this medicine? CARBOPLATIN (KAR boe pla tin) is a chemotherapy drug. It targets fast dividing cells, like cancer cells, and causes these cells to die. This medicine is used to treat ovarian cancer and many other cancers. This medicine may be used for other purposes; ask your health care provider or pharmacist if you have questions. What should I tell my health care provider before I take this medicine? They need to know if you have any of these conditions: -blood disorders -hearing problems -kidney disease -recent or ongoing radiation therapy -an unusual or allergic reaction to carboplatin, cisplatin, other chemotherapy, other medicines, foods, dyes, or preservatives -pregnant or trying to get pregnant -breast-feeding How should I use this medicine? This drug is usually given as an infusion into a vein. It is administered in a hospital or clinic by a specially trained health care professional. Talk to your pediatrician regarding the use of this medicine in children. Special care may be needed. Overdosage: If you think you have taken too much of this medicine contact a poison control center or emergency room at once. NOTE: This medicine is only for you. Do not share this medicine with others. What if I miss a dose? It is important not to miss a dose. Call your doctor or health care professional if you are unable to keep an appointment. What may interact with this medicine? -medicines for seizures -medicines to increase blood counts like filgrastim, pegfilgrastim, sargramostim -some antibiotics like amikacin, gentamicin, neomycin, streptomycin, tobramycin -vaccines Talk to your doctor or health care professional before taking any of these medicines: -acetaminophen -aspirin -ibuprofen -ketoprofen -naproxen This list may not describe all possible interactions. Give your health care provider a list of all  the medicines, herbs, non-prescription drugs, or dietary supplements you use. Also tell them if you smoke, drink alcohol, or use illegal drugs. Some items may interact with your medicine. What should I watch for while using this medicine? Your condition will be monitored carefully while you are receiving this medicine. You will need important blood work done while you are taking this medicine. This drug may make you feel generally unwell. This is not uncommon, as chemotherapy can affect healthy cells as well as cancer cells. Report any side effects. Continue your course of treatment even though you feel ill unless your doctor tells you to stop. In some cases, you may be given additional medicines to help with side effects. Follow all directions for their use. Call your doctor or health care professional for advice if you get a fever, chills or sore throat, or other symptoms of a cold or flu. Do not treat yourself. This drug decreases your body's ability to fight infections. Try to avoid being around people who are sick. This medicine may increase your risk to bruise or bleed. Call your doctor or health care professional if you notice any unusual bleeding. Be careful brushing and flossing your teeth or using a toothpick because you may get an infection or bleed more easily. If you have any dental work done, tell your dentist you are receiving this medicine. Avoid taking products that contain aspirin, acetaminophen, ibuprofen, naproxen, or ketoprofen unless instructed by your doctor. These medicines may hide a fever. Do   not become pregnant while taking this medicine. Women should inform their doctor if they wish to become pregnant or think they might be pregnant. There is a potential for serious side effects to an unborn child. Talk to your health care professional or pharmacist for more information. Do not breast-feed an infant while taking this medicine. What side effects may I notice from receiving this  medicine? Side effects that you should report to your doctor or health care professional as soon as possible: -allergic reactions like skin rash, itching or hives, swelling of the face, lips, or tongue -signs of infection - fever or chills, cough, sore throat, pain or difficulty passing urine -signs of decreased platelets or bleeding - bruising, pinpoint red spots on the skin, black, tarry stools, nosebleeds -signs of decreased red blood cells - unusually weak or tired, fainting spells, lightheadedness -breathing problems -changes in hearing -changes in vision -chest pain -high blood pressure -low blood counts - This drug may decrease the number of white blood cells, red blood cells and platelets. You may be at increased risk for infections and bleeding. -nausea and vomiting -pain, swelling, redness or irritation at the injection site -pain, tingling, numbness in the hands or feet -problems with balance, talking, walking -trouble passing urine or change in the amount of urine Side effects that usually do not require medical attention (report to your doctor or health care professional if they continue or are bothersome): -hair loss -loss of appetite -metallic taste in the mouth or changes in taste This list may not describe all possible side effects. Call your doctor for medical advice about side effects. You may report side effects to FDA at 1-800-FDA-1088. Where should I keep my medicine? This drug is given in a hospital or clinic and will not be stored at home. NOTE: This sheet is a summary. It may not cover all possible information. If you have questions about this medicine, talk to your doctor, pharmacist, or health care provider.    2016, [employer] Standard. (2007-05-23 14:38:05)   

## 2015-12-12 NOTE — Progress Notes (Signed)
It is OK to treat pt with HR 103 and BP 143/76 so long as the HR is regular. This is per Dr Marko Plume.

## 2015-12-15 ENCOUNTER — Telehealth: Payer: Self-pay

## 2015-12-15 NOTE — Telephone Encounter (Signed)
Leslie Rowe is doing very well. Tired today. She is eating and drinking fluids well.  Bowels moving well. Some aches but tolerable. BS not > 250. Daughter checking BS BID and using sliding scale that PCP gave her. Told daughter to call PCP with BS readings and insulin dosing and see if any medication adjustments need to be made and inform Dr. Marko Plume at visit 12-18-15. Daughter knows to call [phone] if she has any questions or concerns.

## 2015-12-16 ENCOUNTER — Other Ambulatory Visit: Payer: Self-pay | Admitting: Oncology

## 2015-12-16 DIAGNOSIS — C482 Malignant neoplasm of peritoneum, unspecified: Secondary | ICD-10-CM

## 2015-12-18 ENCOUNTER — Ambulatory Visit: Payer: Medicare Other | Admitting: Nurse Practitioner

## 2015-12-18 ENCOUNTER — Encounter: Payer: Self-pay | Admitting: Oncology

## 2015-12-18 ENCOUNTER — Other Ambulatory Visit (HOSPITAL_BASED_OUTPATIENT_CLINIC_OR_DEPARTMENT_OTHER): Payer: Medicare Other

## 2015-12-18 ENCOUNTER — Ambulatory Visit (HOSPITAL_BASED_OUTPATIENT_CLINIC_OR_DEPARTMENT_OTHER): Payer: Medicare Other | Admitting: Oncology

## 2015-12-18 ENCOUNTER — Telehealth: Payer: Self-pay

## 2015-12-18 VITALS — BP 126/73 | HR 118 | Temp 98.0°F | Resp 20 | Ht 67.0 in | Wt 148.3 lb

## 2015-12-18 DIAGNOSIS — C801 Malignant (primary) neoplasm, unspecified: Secondary | ICD-10-CM | POA: Diagnosis not present

## 2015-12-18 DIAGNOSIS — C569 Malignant neoplasm of unspecified ovary: Secondary | ICD-10-CM

## 2015-12-18 DIAGNOSIS — C7802 Secondary malignant neoplasm of left lung: Secondary | ICD-10-CM | POA: Diagnosis not present

## 2015-12-18 DIAGNOSIS — J90 Pleural effusion, not elsewhere classified: Secondary | ICD-10-CM

## 2015-12-18 DIAGNOSIS — C482 Malignant neoplasm of peritoneum, unspecified: Secondary | ICD-10-CM

## 2015-12-18 DIAGNOSIS — C786 Secondary malignant neoplasm of retroperitoneum and peritoneum: Secondary | ICD-10-CM

## 2015-12-18 DIAGNOSIS — E119 Type 2 diabetes mellitus without complications: Secondary | ICD-10-CM

## 2015-12-18 DIAGNOSIS — C7801 Secondary malignant neoplasm of right lung: Secondary | ICD-10-CM

## 2015-12-18 DIAGNOSIS — I878 Other specified disorders of veins: Secondary | ICD-10-CM

## 2015-12-18 DIAGNOSIS — C7800 Secondary malignant neoplasm of unspecified lung: Secondary | ICD-10-CM

## 2015-12-18 DIAGNOSIS — K5909 Other constipation: Secondary | ICD-10-CM

## 2015-12-18 LAB — CBC WITH DIFFERENTIAL/PLATELET
BASO%: 0.3 % (ref 0.0–2.0)
Basophils Absolute: 0 10*3/uL (ref 0.0–0.1)
EOS ABS: 0.1 10*3/uL (ref 0.0–0.5)
EOS%: 1.5 % (ref 0.0–7.0)
HCT: 34.3 % — ABNORMAL LOW (ref 34.8–46.6)
HEMOGLOBIN: 11 g/dL — AB (ref 11.6–15.9)
LYMPH%: 14.4 % (ref 14.0–49.7)
MCH: 21.9 pg — ABNORMAL LOW (ref 25.1–34.0)
MCHC: 32.1 g/dL (ref 31.5–36.0)
MCV: 68.2 fL — AB (ref 79.5–101.0)
MONO#: 0.1 10*3/uL (ref 0.1–0.9)
MONO%: 1.9 % (ref 0.0–14.0)
NEUT%: 81.9 % — ABNORMAL HIGH (ref 38.4–76.8)
NEUTROS ABS: 5.9 10*3/uL (ref 1.5–6.5)
PLATELETS: 321 10*3/uL (ref 145–400)
RBC: 5.03 10*6/uL (ref 3.70–5.45)
RDW: 14.5 % (ref 11.2–14.5)
WBC: 7.2 10*3/uL (ref 3.9–10.3)
lymph#: 1 10*3/uL (ref 0.9–3.3)

## 2015-12-18 LAB — COMPREHENSIVE METABOLIC PANEL
ALBUMIN: 3.5 g/dL (ref 3.5–5.0)
ALK PHOS: 94 U/L (ref 40–150)
ALT: 77 U/L — AB (ref 0–55)
ANION GAP: 11 meq/L (ref 3–11)
AST: 82 U/L — ABNORMAL HIGH (ref 5–34)
BILIRUBIN TOTAL: 0.62 mg/dL (ref 0.20–1.20)
BUN: 15 mg/dL (ref 7.0–26.0)
CO2: 26 meq/L (ref 22–29)
CREATININE: 1 mg/dL (ref 0.6–1.1)
Calcium: 10.3 mg/dL (ref 8.4–10.4)
Chloride: 100 mEq/L (ref 98–109)
EGFR: 65 mL/min/{1.73_m2} — AB (ref >90)
GLUCOSE: 287 mg/dL — AB (ref 70–140)
Potassium: 4.8 mEq/L (ref 3.5–5.1)
SODIUM: 137 meq/L (ref 136–145)
TOTAL PROTEIN: 7.3 g/dL (ref 6.4–8.3)

## 2015-12-18 MED ORDER — LORAZEPAM 0.5 MG PO TABS: ORAL_TABLET | ORAL | 0 refills | Status: DC

## 2015-12-18 NOTE — Telephone Encounter (Signed)
-----   Message from Gordy Levan, MD sent at 12/18/2015 11:40 AM EDT ----- Need to let her know about CXR today - ? Enough fluid for thoracentesis to help SOB   Please send refill for ativan #30  thanks

## 2015-12-18 NOTE — Telephone Encounter (Signed)
Pt was aware of need for CXR. She was in a rush to leave for 2 family problems. Will get CXR done in the morning.

## 2015-12-18 NOTE — Progress Notes (Signed)
OFFICE PROGRESS NOTE   December 18, 2015   Physicians: P.Gehrig, Pricilla Holm, (C.Gessner) (M.Norins)  INTERVAL HISTORY:  Patient is seen, together with daughter, in continuing attention to advanced gyn carcionma, for which she has begun treatment with cycle 1 carboplatin taxol on 12-12-15. She has not had gCSF thus far.  CT chest 12-10-15 confirms multiple bilateral pulmonary mets, as had been suspected from the previous CT AP.  She also has possibly a moderate size pleural effusion by CT 12-10-15.   Patient tolerated first chemotherapy fairly well, with no infusion reaction, minimal intermittent nausea and no vomiting, slight generalized taxol aches which have resolved, no peripheral neuropathy. Blood sugars were mostly high 200s just after chemo, daughter checking these AC HS and covering with SS insulin per Dr Sharlet Salina. Peripheral IV access was not easy, PAC to be placed prior to next treatment. Patient notices increased SOB mostly with exertion, no productive cough, no chest pain, breathing some better when she sits up very straight. Bowels have moved daily on daily miralax now. She has been able to drink buttermilk and Boost, eating some of regular diet (oatmeal, cheese, carrots, potatoes).  Bladder ok. No LE swelling, no bleeding , no fever or symptoms of infection. Remainder of 10 point Review of Systems negative.   PAC scheduled 01-01-16 Genetics counseling pending   ONCOLOGIC HISTORY Patient developed some mild GI symptoms in past 3-4 months, initially thought related to the diabetes medications. Symptoms of constipation, early satiety and abdominal fullness and general abdominal soreness persisted, with CT AP 11-07-15 found bibasilar pulmonary nodules and small right pleural effusion; uterus surgically absent, right adnexal solid and cystic mass 5.8 x 3.7 x 4.7 cm;  predominantly cystic mass with smaller soft tissue component in left adnexa 6.1 x 2.8 x 7.0 cm; diffuse omental tumor  infiltration up to 3 cm thick; tumor deposits are seen in the pericolic gutters bilaterally; large tumor deposit at the cul de sac at least 4.1 cm diameter; probable tumor deposits along small bowel loops and colon in pelvis. CA 125 on 11-12-15 was 301.8, with CEA 8.3. She was seen by Dr Alycia Rossetti on 11-14-15, with omental biopsy 11-14-15 ([id number]) adenocarcinoma consistent with metastatic ovarian. Recommendation was for neoadjuvant chemotherapy with consideration of surgery by gyn oncology after ~ cycle 3 depending on response Cycle 1 carbo taxol given 12-12-15.  CT chest 12-10-15 showed multiple bilateral pulmonary mets and right pleural effusion.   Objective:  Vital signs in last 24 hours:  BP 126/73 (BP Location: Right Arm, Patient Position: Sitting)   Pulse (!) 118   Temp 98 F (36.7 C) (Oral)   Resp 20   Ht '5\' 7"'$  (1.702 m)   Wt 148 lb 4.8 oz (67.3 kg)   SpO2 97%   BMI 23.23 kg/m  Weight down 6 lbs Alert, oriented and appropriate. Ambulatory without assistance.  No alopecia. Respirations not labored with exertion in exam room, not in any acute discomfort.  HEENT:PERRL, sclerae not icteric. Oral mucosa moist without lesions, posterior pharynx with dull erythema bilaterally suggesting post nasal drainage. Left TM dull, right TM clear, no erythema Neck supple. No JVD.  Lymphatics:no cervical,supraclavicular or inguinal adenopathy Resp: somewhat diminished BS bilaterally without wheezes or rales, dullness to percussion right base Cardio: regular rate and rhythm. No gallop. GI: soft, nontender, mildly distended as previously, full without clear HSM. Some bowel sounds.  Musculoskeletal/ Extremities: LE without pitting edema, cords, tenderness Neuro: no peripheral neuropathy. Otherwise nonfocal. PSYCH appropriate mood and affect Skin without  rash, ecchymosis, petechiae   Lab Results:  Results for orders placed or performed in visit on 12/18/15  CBC with Differential  Result Value Ref  Range   WBC 7.2 3.9 - 10.3 10e3/uL   NEUT# 5.9 1.5 - 6.5 10e3/uL   HGB 11.0 (L) 11.6 - 15.9 g/dL   HCT 34.3 (L) 34.8 - 46.6 %   Platelets 321 145 - 400 10e3/uL   MCV 68.2 (L) 79.5 - 101.0 fL   MCH 21.9 (L) 25.1 - 34.0 pg   MCHC 32.1 31.5 - 36.0 g/dL   RBC 5.03 3.70 - 5.45 10e6/uL   RDW 14.5 11.2 - 14.5 %   lymph# 1.0 0.9 - 3.3 10e3/uL   MONO# 0.1 0.1 - 0.9 10e3/uL   Eosinophils Absolute 0.1 0.0 - 0.5 10e3/uL   Basophils Absolute 0.0 0.0 - 0.1 10e3/uL   NEUT% 81.9 (H) 38.4 - 76.8 %   LYMPH% 14.4 14.0 - 49.7 %   MONO% 1.9 0.0 - 14.0 %   EOS% 1.5 0.0 - 7.0 %   BASO% 0.3 0.0 - 2.0 %  Comprehensive metabolic panel  Result Value Ref Range   Sodium 137 136 - 145 mEq/L   Potassium 4.8 3.5 - 5.1 mEq/L   Chloride 100 98 - 109 mEq/L   CO2 26 22 - 29 mEq/L   Glucose 287 (H) 70 - 140 mg/dl   BUN 15.0 7.0 - 26.0 mg/dL   Creatinine 1.0 0.6 - 1.1 mg/dL   Total Bilirubin 0.62 0.20 - 1.20 mg/dL   Alkaline Phosphatase 94 40 - 150 U/L   AST 82 (H) 5 - 34 U/L   ALT 77 (H) 0 - 55 U/L   Total Protein 7.3 6.4 - 8.3 g/dL   Albumin 3.5 3.5 - 5.0 g/dL   Calcium 10.3 8.4 - 10.4 mg/dL   Anion Gap 11 3 - 11 mEq/L   EGFR 65 (L) >90 ml/min/1.73 m2     Studies/Results:  EXAM: CT CHEST WITH CONTRAST  COMPARISON:  Multiple exams, including 11/14/2015  FINDINGS: Cardiovascular: Coronary, aortic arch, and branch vessel atherosclerotic vascular disease.  Mediastinum/Nodes: Right hilar node 1.0 cm in short axis on image 56/2. Indistinct margins of the mid and distal esophagus. Pericardial nodularity within 9 mm pericardial node on image 108/2 and an 8 mm in short axis pericardial nodule on image 109/2.  Lungs/Pleura: Innumerable pulmonary nodules of varying size is air scattered throughout both lungs. New and enlarging pulmonary nodules compared to 11/07/15, for example a left lower lobe nodule is now 6 by 4 mm and was previously 5 by 3 mm on image 101/5. Some of the nodules have slightly  shaggy/ill-defined margins. An index lesion in the right upper lobe measures 1.2 by 1.0 cm on image 26/5. A 7 mm nodule on image 33/5 in the left upper lobe is potentially cavitary although this appearance may simply reflect a bronchiectatic airway or small bulla along the margin of the nodule. Certainly cavitation is not the norm for the 100 or more other pulmonary nodules in the lungs.  Moderate and enlarged right pleural effusion, nonspecific for transudative versus exudative etiology, although circumstances raise concern for malignant pleural effusion.  Upper Abdomen: Perisplenic ascites. Peritoneal carcinomatosis. Beaded dorsal pancreatic duct in the pancreatic tail, tumor involving the pancreas not excluded. Dilated low-density right retrocrural structure, possibly a low-density lymph node or cisterna chyli.  Musculoskeletal: Unremarkable  IMPRESSION: 1. Innumerable pulmonary nodules of variable size compatible with diffuse pulmonary metastatic disease. Nodules appear minimally  larger and there are some new tiny pulmonary nodules in the lung bases compared to 11/07/2015. 2. Moderate and enlarging right pleural effusion. 3. Extensive peritoneal carcinomatosis in the upper abdomen. Possible tumor involving the pancreatic tail. 4. Borderline prominent right hilar lymph node.  PACs images reviewed with patient and daughter.   Medications: I have reviewed the patient's current medications. Already on claritin, add flonase for allergy symptoms including left TM fluid; prefer not to use decongestant if this helps Refill ativan, which she is using to help sleep at night for now. Patient and daughter understand that this may not be best solution long term, but is very helpful for now Continue daily miralax  DISCUSSION Results of the CT chest discussed.  I have explained that metastatic disease is not curable, tho may still have good response to systemic treatment to improve  and control the cancer for some period of time. If she has very good response, gyn oncology may still consider interval surgery.  Understandably, this was difficult information for patient and daughter to hear. They are very supportive of one another.  We have discussed SOB from pulmonary mets and the pleural effusion. To improve symptoms now, until hopefully chemo already begun can show some effect, I have recommended CXR now, then thoracentesis by IR if sufficient fluid (would sent cytology if thoracentesis done).  Discussed modifying activity if SOB with exertion or fatigue.   They understand that blood counts will likely drop further in next week. If she is very fatigued prior to next visit 10-26 she will let us know, repeat CBC then if so.    Assessment/Plan:  1. Peritoneal carcinomatosis: primary peritoneal vs ovarian,  metastatic to lungs with innumerable small pulmonary nodules bilaterally and some right pleural effusion. Symptomatic from abdominal disease tho no bowel obstruction, and more SOB with exertion. First carbo taxol given 12-12-15, counts still ok tho will drop, granix approved if needed. CXR today to see if enough right pleural effusion to help symptomatically if tap. Note O2 sat good, would not qualify for home O2 now. Expect to reimage CAP after cycle 3, consider interval surgery by gyn oncology then. I will see her back with labs 1 week. Follow CA 125 with treatment. 2.Diabetes on metformin: blood sugar better, following CBGs at home with medication adjustment by Dr Sharlet Salina. Requested [place] dietician to see due to suboptimal nutritional intake, unintentional weight loss and hyperglycemia. I do not expect tight blood sugar control as priority now is the extensive gyn cancer, but even so it is helpful not to have extremely high or low glucoses. 3.peripheral venous access not adequate for all of anticipated chemo. For PAC by IR 01-01-16. 4. Constipation: likely related to peritoneal  carcinomatosis. Bowels now moving daily with miralax daily 5.post hysterectomy without oophorectomy ~ 1970 6.flu vaccine 10-28-15 7.work noise exposure in past with decreased hearing 8.minimal past tobacco, DCd 1977. Second hand tobacco exposure. 9.Living WIll and HCPOA in place. Social: daughter with 32 yo twins has just moved from [place] to be with patient, children in school in [place] now. Message to [facility] chaplain to let them know of support services thru [facility] 10.environmental allergies: add flonase to ongoing claritin. Note left TM dull on exam today.   All questions answered and they know to call at any time if needed prior to next scheduled visit. Will let her know CXR results and will set up US thoracentesis if appropriate, send for cytology if so. I will update Drs Alycia Rossetti and Sharlet Salina.  Time spent 30 min including >50% counseling and coordination of care.    Evlyn Clines, MD   12/18/2015, 8:41 PM

## 2015-12-19 ENCOUNTER — Ambulatory Visit (HOSPITAL_COMMUNITY)
Admission: RE | Admit: 2015-12-19 | Discharge: 2015-12-19 | Disposition: A | Discharge status: Home / Self Care | Payer: Medicare Other | Source: Ambulatory Visit | Attending: Oncology | Admitting: Oncology

## 2015-12-19 ENCOUNTER — Telehealth: Payer: Self-pay

## 2015-12-19 DIAGNOSIS — J90 Pleural effusion, not elsewhere classified: Secondary | ICD-10-CM | POA: Diagnosis present

## 2015-12-19 DIAGNOSIS — C7800 Secondary malignant neoplasm of unspecified lung: Secondary | ICD-10-CM | POA: Insufficient documentation

## 2015-12-19 DIAGNOSIS — C482 Malignant neoplasm of peritoneum, unspecified: Secondary | ICD-10-CM | POA: Diagnosis not present

## 2015-12-19 NOTE — Telephone Encounter (Signed)
S/w Dr Marko Plume. S/w pt that not enough fluid for thoracentesis. Keep activities adjusted to prevent SOB. Go to ER of SOB worsens.

## 2015-12-19 NOTE — Telephone Encounter (Signed)
-----   Message from Gordy Levan, MD sent at 12/18/2015 11:40 AM EDT ----- Need to let her know about CXR today - ? Enough fluid for thoracentesis to help SOB   Please send refill for ativan #30  thanks

## 2015-12-20 DIAGNOSIS — C7800 Secondary malignant neoplasm of unspecified lung: Secondary | ICD-10-CM | POA: Insufficient documentation

## 2015-12-20 DIAGNOSIS — C561 Malignant neoplasm of right ovary: Secondary | ICD-10-CM | POA: Insufficient documentation

## 2015-12-20 DIAGNOSIS — J90 Pleural effusion, not elsewhere classified: Secondary | ICD-10-CM | POA: Insufficient documentation

## 2015-12-24 ENCOUNTER — Other Ambulatory Visit: Payer: Self-pay | Admitting: Oncology

## 2015-12-24 DIAGNOSIS — C482 Malignant neoplasm of peritoneum, unspecified: Secondary | ICD-10-CM

## 2015-12-24 DIAGNOSIS — C786 Secondary malignant neoplasm of retroperitoneum and peritoneum: Secondary | ICD-10-CM

## 2015-12-24 DIAGNOSIS — C569 Malignant neoplasm of unspecified ovary: Secondary | ICD-10-CM

## 2015-12-24 DIAGNOSIS — C801 Malignant (primary) neoplasm, unspecified: Secondary | ICD-10-CM

## 2015-12-25 ENCOUNTER — Encounter: Payer: Self-pay | Admitting: General Practice

## 2015-12-25 ENCOUNTER — Encounter: Payer: Self-pay | Admitting: Oncology

## 2015-12-25 ENCOUNTER — Other Ambulatory Visit (HOSPITAL_BASED_OUTPATIENT_CLINIC_OR_DEPARTMENT_OTHER): Payer: Medicare Other

## 2015-12-25 ENCOUNTER — Ambulatory Visit (HOSPITAL_BASED_OUTPATIENT_CLINIC_OR_DEPARTMENT_OTHER): Payer: Medicare Other | Admitting: Oncology

## 2015-12-25 VITALS — BP 124/74 | HR 103 | Temp 97.6°F | Resp 18 | Ht 67.0 in | Wt 147.7 lb

## 2015-12-25 DIAGNOSIS — D701 Agranulocytosis secondary to cancer chemotherapy: Secondary | ICD-10-CM | POA: Diagnosis not present

## 2015-12-25 DIAGNOSIS — C801 Malignant (primary) neoplasm, unspecified: Secondary | ICD-10-CM | POA: Diagnosis not present

## 2015-12-25 DIAGNOSIS — J90 Pleural effusion, not elsewhere classified: Secondary | ICD-10-CM

## 2015-12-25 DIAGNOSIS — I878 Other specified disorders of veins: Secondary | ICD-10-CM

## 2015-12-25 DIAGNOSIS — C569 Malignant neoplasm of unspecified ovary: Secondary | ICD-10-CM

## 2015-12-25 DIAGNOSIS — C7801 Secondary malignant neoplasm of right lung: Secondary | ICD-10-CM | POA: Diagnosis not present

## 2015-12-25 DIAGNOSIS — C7802 Secondary malignant neoplasm of left lung: Secondary | ICD-10-CM

## 2015-12-25 DIAGNOSIS — C786 Secondary malignant neoplasm of retroperitoneum and peritoneum: Secondary | ICD-10-CM | POA: Diagnosis not present

## 2015-12-25 DIAGNOSIS — C7800 Secondary malignant neoplasm of unspecified lung: Secondary | ICD-10-CM

## 2015-12-25 DIAGNOSIS — T451X5A Adverse effect of antineoplastic and immunosuppressive drugs, initial encounter: Secondary | ICD-10-CM

## 2015-12-25 DIAGNOSIS — E119 Type 2 diabetes mellitus without complications: Secondary | ICD-10-CM

## 2015-12-25 DIAGNOSIS — K5900 Constipation, unspecified: Secondary | ICD-10-CM

## 2015-12-25 LAB — COMPREHENSIVE METABOLIC PANEL
ALK PHOS: 101 U/L (ref 40–150)
ALT: 34 U/L (ref 0–55)
ANION GAP: 9 meq/L (ref 3–11)
AST: 38 U/L — AB (ref 5–34)
Albumin: 3.4 g/dL — ABNORMAL LOW (ref 3.5–5.0)
BILIRUBIN TOTAL: 0.25 mg/dL (ref 0.20–1.20)
BUN: 14.3 mg/dL (ref 7.0–26.0)
CALCIUM: 10.1 mg/dL (ref 8.4–10.4)
CO2: 27 mEq/L (ref 22–29)
CREATININE: 0.8 mg/dL (ref 0.6–1.1)
Chloride: 103 mEq/L (ref 98–109)
EGFR: 79 mL/min/{1.73_m2} — ABNORMAL LOW (ref >90)
Glucose: 174 mg/dl — ABNORMAL HIGH (ref 70–140)
Potassium: 4.5 mEq/L (ref 3.5–5.1)
Sodium: 139 mEq/L (ref 136–145)
TOTAL PROTEIN: 7.5 g/dL (ref 6.4–8.3)

## 2015-12-25 LAB — CBC WITH DIFFERENTIAL/PLATELET
BASO%: 1.5 % (ref 0.0–2.0)
Basophils Absolute: 0 10*3/uL (ref 0.0–0.1)
EOS%: 5.2 % (ref 0.0–7.0)
Eosinophils Absolute: 0.2 10*3/uL (ref 0.0–0.5)
HEMATOCRIT: 34.6 % — AB (ref 34.8–46.6)
HEMOGLOBIN: 10.7 g/dL — AB (ref 11.6–15.9)
LYMPH#: 1.4 10*3/uL (ref 0.9–3.3)
LYMPH%: 47 % (ref 14.0–49.7)
MCH: 21.2 pg — ABNORMAL LOW (ref 25.1–34.0)
MCHC: 30.8 g/dL — ABNORMAL LOW (ref 31.5–36.0)
MCV: 68.9 fL — ABNORMAL LOW (ref 79.5–101.0)
MONO#: 0.6 10*3/uL (ref 0.1–0.9)
MONO%: 19.4 % — ABNORMAL HIGH (ref 0.0–14.0)
NEUT%: 26.9 % — ABNORMAL LOW (ref 38.4–76.8)
NEUTROS ABS: 0.8 10*3/uL — AB (ref 1.5–6.5)
PLATELETS: 329 10*3/uL (ref 145–400)
RBC: 5.02 10*6/uL (ref 3.70–5.45)
RDW: 14.3 % (ref 11.2–14.5)
WBC: 2.9 10*3/uL — AB (ref 3.9–10.3)

## 2015-12-25 MED ORDER — DEXAMETHASONE 4 MG PO TABS: ORAL_TABLET | ORAL | 1 refills | Status: DC

## 2015-12-25 MED ORDER — TBO-FILGRASTIM 300 MCG/0.5ML ~~LOC~~ SOSY
300.0000 ug | PREFILLED_SYRINGE | Freq: Once | SUBCUTANEOUS | Status: AC
  Administered 2015-12-25: 300 ug via SUBCUTANEOUS
  Filled 2015-12-25: qty 0.5

## 2015-12-25 NOTE — Progress Notes (Signed)
[facility] Spiritual Care Note  Met with Leslie Rowe and her daughter Leslie Rowe following her appt with Dr Livesay in order to introduce Spiritual Care as part of her Support Team.  Leslie Rowe and Leslie Rowe recognized me from my presentation in chemo education class and welcomed pastoral support.  They identify faith as their biggest meaning-making/coping tool and used our encounter well to share and process their recent life changes (adjusting to cancer dx, Leslie Rowe's relocation to [place] from [place] within the last month in order to support pt).  Built rapport, establishing chaplain support as ongoing resource.  Provided normalization of feelings, pastoral reflection, encouragement, and prayer per request.  Pt and dtr state they are very comfortable requesting chaplain support in the future and also welcome visits in infusion, etc.  Following for support, but please also page if immediate needs arise.  Thank you.   Chaplain  , MDiv, [facility] Pager [phone] Voicemail [phone] 

## 2015-12-25 NOTE — Progress Notes (Signed)
OFFICE PROGRESS NOTE   December 25, 2015   Physicians: P.Gehrig, Pricilla Holm, (C.Gessner) (M.Norins)  INTERVAL HISTORY:  Patient is seen, together with daughter, is neutropenic with ANC 0.8 now day 14 cycle 1 carboplatin taxol  for metastatic gyn carcinoma. The gyn malignancy is likely ovarian vs primary peritoneal, extensive in pelvis and involving omentum, and multiple bilateral pulmonary nodules.   Young granddaughter has viral type illness at home now. Patient is not febrile and does not have similar symptoms.  Tho CT chest 12-10-15 showed right pleural effusion, there was not enough fluid to tap by CXR on 12-19-15.    Patient is not febrile and is feeling better overall today, including improvement in breathing over past couple of days. Patient reports SOB is much improved now, still no cough and no chest pain. She is able to lie on her her side, which had been too uncomfortable prior to start of chemo. She has less generalized abdominal soreness and no frank abdominal or pelvic pain.  Bowels are moving well daily with daily miralax. She is drinking 2 Boost daily + light meals, is to meet with [facility] on 12-29-15. They understand that good nutrition is very important component of her care now. She is able to sleep. No bladder symptoms. No bleeding. Daughter is checking patient's blood sugars ac and hs with SS insulin coverage, BS 179 fasting this AM, up to 300 once in past week (last decadron 12-12-15). She is to see Dr Sharlet Salina on 12-29-15; I have told patient and daughter that, even if she is otherwise managed with oral diabetic meds,  she may well need SS coverage for a few days around each chemotherapy treatment because of necessary decadron premedication (decadron 20 mg 12 hrs prior, 6 hrs prior and at time of chemo infusion).  SS regular insulin is also given at [facility] at time of each chemo treatment.  Remainder of 10 point Review of Systems negative.   Flu vaccine  10-28-15 PAC scheduled 01-01-16 Genetics counseling pending  ONCOLOGIC HISTORY  Patient developed some mild GI symptoms in past 3-4 months, initially thought related to the diabetes medications. Symptoms of constipation, early satiety and abdominal fullness and general abdominal soreness persisted, with CT AP 11-07-15 found bibasilar pulmonary nodules and small right pleural effusion; uterus surgically absent, right adnexal solid and cystic mass5.8 x 3.7 x 4.7 cm; predominantly cystic mass with smaller soft tissue component in left adnexa6.1 x 2.8 x 7.0 cm; diffuse omental tumor infiltration up to 3 cm thick;tumor deposits are seen in the pericolic gutters bilaterally; large tumor deposit at the cul de sac at least 4.1 cm diameter; probable tumor deposits along small bowel loops and colon in pelvis. CA 125 on 11-12-15 was 301.8, with CEA 8.3. She was seen by Dr Alycia Rossetti on 11-14-15, with omental biopsy 11-14-15 ([id number]) adenocarcinoma consistent with metastatic ovarian. Recommendation was for neoadjuvant chemotherapy with consideration of surgery by gyn oncology after ~ cycle 3 depending on response Cycle 1 carbo taxol given 12-12-15.  CT chest 12-10-15 showed multiple bilateral pulmonary mets and right pleural effusion, tho not enough pleural fluid to tap.     Objective:  Vital signs in last 24 hours:  BP 124/74 (BP Location: Left Arm, Patient Position: Sitting)   Pulse (!) 103   Temp 97.6 F (36.4 C) (Oral)   Resp 18   Ht '5\' 7"'$  (1.702 m)   Wt 147 lb 11.2 oz (67 kg)   SpO2 100%   BMI 23.13 kg/m  Weight stable. Looks better overall today. Respirations not labored with activity in exam room Alert, oriented and appropriate. Ambulatory without difficulty, easily able to get on and off exam table. No alopecia  HEENT:PERRL, sclerae not icteric. Oral mucosa moist without lesions, posterior pharynx clear.  Neck supple. No JVD.  Lymphatics:no cervical,supraclavicular or inguinal  adenopathy Resp: clear to auscultation bilaterally and normal percussion other than just right base. Cardio: regular rate and rhythm. No gallop. GI: soft, nontender, no obvious change in upper abdominal fullness, otherwise not distended, no mass or organomegaly. A few bowel sounds.  Musculoskeletal/ Extremities: without pitting edema, cords, tenderness Neuro: no significant peripheral neuropathy. Otherwise nonfocal. PSYCH appropriate mood and affect Skin without rash, ecchymosis, petechiae   Lab Results:  Results for orders placed or performed in visit on 12/25/15  CBC with Differential  Result Value Ref Range   WBC 2.9 (L) 3.9 - 10.3 10e3/uL   NEUT# 0.8 (L) 1.5 - 6.5 10e3/uL   HGB 10.7 (L) 11.6 - 15.9 g/dL   HCT 34.6 (L) 34.8 - 46.6 %   Platelets 329 145 - 400 10e3/uL   MCV 68.9 (L) 79.5 - 101.0 fL   MCH 21.2 (L) 25.1 - 34.0 pg   MCHC 30.8 (L) 31.5 - 36.0 g/dL   RBC 5.02 3.70 - 5.45 10e6/uL   RDW 14.3 11.2 - 14.5 %   lymph# 1.4 0.9 - 3.3 10e3/uL   MONO# 0.6 0.1 - 0.9 10e3/uL   Eosinophils Absolute 0.2 0.0 - 0.5 10e3/uL   Basophils Absolute 0.0 0.0 - 0.1 10e3/uL   NEUT% 26.9 (L) 38.4 - 76.8 %   LYMPH% 47.0 14.0 - 49.7 %   MONO% 19.4 (H) 0.0 - 14.0 %   EOS% 5.2 0.0 - 7.0 %   BASO% 1.5 0.0 - 2.0 %  Comprehensive metabolic panel  Result Value Ref Range   Sodium 139 136 - 145 mEq/L   Potassium 4.5 3.5 - 5.1 mEq/L   Chloride 103 98 - 109 mEq/L   CO2 27 22 - 29 mEq/L   Glucose 174 (H) 70 - 140 mg/dl   BUN 14.3 7.0 - 26.0 mg/dL   Creatinine 0.8 0.6 - 1.1 mg/dL   Total Bilirubin 0.25 0.20 - 1.20 mg/dL   Alkaline Phosphatase 101 40 - 150 U/L   AST 38 (H) 5 - 34 U/L   ALT 34 0 - 55 U/L   Total Protein 7.5 6.4 - 8.3 g/dL   Albumin 3.4 (L) 3.5 - 5.0 g/dL   Calcium 10.1 8.4 - 10.4 mg/dL   Anion Gap 9 3 - 11 mEq/L   EGFR 79 (L) >90 ml/min/1.73 m2   CA 125 available after visit 739, comparison  469 on 10-6, first chemo 12-12-15.  Studies/Results:  No results  found.  Medications: I have reviewed the patient's current medications. Granix today with neutropenia and granddaughter ill at home.  Claritin with granix  DISCUSSION Interval history reviewed. Some symptoms are improving just since first chemo, which is encouraging. Note CA 125 was not available after visit, will follow but clinically apparent early improvement.   Neutropenia discussed, including neutropenic precautions. Mechanism of action of granix discussed, as well as possible aches from granix. Will check counts on 10-27, additional granix then per parameters. Will add gCSF after subsequent chemotherapy cycles.   Coverage for blood sugars as above, likely will need SS insulin at least day of each chemo and for 1-2 days after due to premed decadron, 3 doses of 20 mg  each in the 12 hrs prior to each treatment.  Appreciate Dr Nathanial Millman assistance.   Assessment/Plan:  1. Peritoneal carcinomatosis: primary peritoneal vs ovarian,  metastatic to lungs with innumerable small pulmonary nodules bilaterally and some right pleural effusion. Treatment begun with chemotherapy,  first carbo taxol given 12-12-15. Neutropenic today, add granix as noted. Due cycle 2 on 11-3 as long as ANC >=1.5 and plt >=100k, using AUC = 6 and taxol 175 mg/m2. . Clinically some better already, not yet reflected in CA 125, follow. 2.Diabetes on metformin and SS insulin: following CBGs at home, follow up with Dr Sharlet Salina 12-29-15.  Decadron around chemo as above, SS insulin with chemo infusion here. [place] dietician to see, po intake some better now. 3.peripheral venous access not adequate for all of anticipated chemo. For PAC by IR 01-01-16. 4.Constipation: likely related to peritoneal carcinomatosis. Bowels now moving daily with miralax daily 5.post hysterectomy without oophorectomy ~ 1970 6.flu vaccine 10-28-15 7.work noise exposure in past with decreased hearing 8.minimal past tobacco, DCd 1977. Second hand tobacco  exposure. 9.Living WIll and HCPOA in place. Social: daughter with 52 yo twins has just moved from [place] to be with patient, children in school in [place] now. Appreciate support from [facility] chaplain  10.environmental allergies: add flonase to ongoing claritin  All questions answered and they know to call at any time if needed between scheduled appointments. Granix orders placed (already preauthorized). Chemo orders signed for cycle 2. TIme spent 25 min including >50% counselng and coordination of care.       Evlyn Clines, MD   12/25/2015, 5:36 PM

## 2015-12-26 ENCOUNTER — Ambulatory Visit: Payer: Medicare Other

## 2015-12-26 ENCOUNTER — Other Ambulatory Visit: Payer: Medicare Other

## 2015-12-26 ENCOUNTER — Other Ambulatory Visit (HOSPITAL_BASED_OUTPATIENT_CLINIC_OR_DEPARTMENT_OTHER): Payer: Medicare Other

## 2015-12-26 DIAGNOSIS — T451X5A Adverse effect of antineoplastic and immunosuppressive drugs, initial encounter: Principal | ICD-10-CM

## 2015-12-26 DIAGNOSIS — C801 Malignant (primary) neoplasm, unspecified: Secondary | ICD-10-CM | POA: Diagnosis not present

## 2015-12-26 DIAGNOSIS — D701 Agranulocytosis secondary to cancer chemotherapy: Secondary | ICD-10-CM

## 2015-12-26 LAB — CBC WITH DIFFERENTIAL/PLATELET
BASO%: 0.8 % (ref 0.0–2.0)
Basophils Absolute: 0.1 10*3/uL (ref 0.0–0.1)
EOS%: 1.4 % (ref 0.0–7.0)
Eosinophils Absolute: 0.2 10*3/uL (ref 0.0–0.5)
HEMATOCRIT: 33 % — AB (ref 34.8–46.6)
HGB: 10.3 g/dL — ABNORMAL LOW (ref 11.6–15.9)
LYMPH#: 2.5 10*3/uL (ref 0.9–3.3)
LYMPH%: 17.4 % (ref 14.0–49.7)
MCH: 21.4 pg — ABNORMAL LOW (ref 25.1–34.0)
MCHC: 31.2 g/dL — ABNORMAL LOW (ref 31.5–36.0)
MCV: 68.8 fL — ABNORMAL LOW (ref 79.5–101.0)
MONO#: 1.6 10*3/uL — AB (ref 0.1–0.9)
MONO%: 11.3 % (ref 0.0–14.0)
NEUT%: 69.1 % (ref 38.4–76.8)
NEUTROS ABS: 9.9 10*3/uL — AB (ref 1.5–6.5)
PLATELETS: 333 10*3/uL (ref 145–400)
RBC: 4.8 10*6/uL (ref 3.70–5.45)
RDW: 13.9 % (ref 11.2–14.5)
WBC: 14.3 10*3/uL — AB (ref 3.9–10.3)

## 2015-12-26 LAB — CA 125: Cancer Antigen (CA) 125: 739.7 U/mL — ABNORMAL HIGH (ref 0.0–38.1)

## 2015-12-26 NOTE — Progress Notes (Signed)
Injection not needed per Dr. Marko Plume. Copy of labs and schedule given to patient and family member. Instructed to follow schedule and call office if issues occur. Pt verbalized understanding of instructions.

## 2015-12-27 ENCOUNTER — Other Ambulatory Visit: Payer: Self-pay | Admitting: Internal Medicine

## 2015-12-27 DIAGNOSIS — D701 Agranulocytosis secondary to cancer chemotherapy: Secondary | ICD-10-CM | POA: Insufficient documentation

## 2015-12-27 DIAGNOSIS — T451X5A Adverse effect of antineoplastic and immunosuppressive drugs, initial encounter: Secondary | ICD-10-CM

## 2015-12-29 ENCOUNTER — Encounter: Payer: Self-pay | Admitting: Internal Medicine

## 2015-12-29 ENCOUNTER — Ambulatory Visit (INDEPENDENT_AMBULATORY_CARE_PROVIDER_SITE_OTHER): Payer: Medicare Other | Admitting: Internal Medicine

## 2015-12-29 ENCOUNTER — Encounter: Payer: Medicare Other | Admitting: Nutrition

## 2015-12-29 DIAGNOSIS — E119 Type 2 diabetes mellitus without complications: Secondary | ICD-10-CM | POA: Diagnosis not present

## 2015-12-29 NOTE — Patient Instructions (Addendum)
Keep the same scale for the day of decadron and the next 3-4 days after:  Check sugars and if: 0-150 take no insulin, 151-250 take 2 units, 251-300 take 4 units, 301-400, take 6 units, >400 call office   For the days when you are not on the decadron use this scale:  If 0-200 take no insulin, if 201-275 take 2 units, 276-350 take 4 units, if greater than 350 call office for instructions  If your sugars on non-decadron days are regularly >200 or 250 call us and we may add a once a day sugar medicine called glipizide to help keep the sugars lower so you do not have to take insulin as much.   You will likely still need the insulin with the decadron.

## 2015-12-29 NOTE — Progress Notes (Signed)
Pre visit review using our clinic review tool, if applicable. No additional management support is needed unless otherwise documented below in the visit note. 

## 2015-12-29 NOTE — Progress Notes (Signed)
Nutrition Assessment:  80 year old female diagnosed with likely ovarian cancer vs primary peritoneal, extensive in pelvis and involving omentum, and multiple bilateral pulmonary nodules. Receiving chemotherapy.  Past medical history: HTN, DM, HLD  Medications:miralax, decadron prior to chemotherapy, sliding scale insulin, lispro, metformin, MVI  Labs: 10/26 serum glucose 174  Ht: 67 inches Wt: 145 pounds on today.  Note wt on 10/10/2015 160 pounds 12.8 oz.  9% wt loss in the last 2 months UBW: 160 pounds BMI: 22  Estimated Energy Needs: 1980-2300 kcals, 80-99 g protein, 2.3 L fluid.  Pt reports appetite has been decreased (?? Past 8 months) prior to diagnosis of ovarian cancer secondary to abdominal pain, some feeling of fullness, bloated, constipation.  Reports now just no appetite and forcing self to eat.  Drank smoothie before coming to appointment this am and tolerated well.  Has been drinking boost glucose control about 1 per day, usually has toast or oatmeal for breakfast, sandwich or soup for lunch and meat and vegetables for dinner.  Dtr has recently moved in with pt and dtr has been preparing meals.     She reports constipation is better with taking miralax.  Dtr reports pt does not take it daily then does have bouts of constipation.    Nutrition-Focused physical exam completed. Findings are no fat depletion, severe in temple region, mild in thigh, patellar area muscle depletion, and no edema.    Nutrition diagnosis: Unintentional weight loss related to diagnosis of ovarian cancer as evidenced by 9% weight loss in the last 2 months  Patient meets criteria for severe malnutrition in context of acute illness but likely progressing to chronic as evidenced by 9% wt loss in the last 2 months and severe to moderate muscle depletion.   Intervention: Discussed interventions/strategies to increase kcals and protein (handout provided). Encouraged small frequent meals of high calorie,  high protein foods Encouraged increasing boost glucose control to 2 bottles per day. Discuss alternative supplement choices as well (ie ensure enlive -aware more carbohydrate but kcals and protein out way increase carbs). Coupons given Recipes given as well Pt may benefit from appetite stimulant.  Per pt MD has discussed appetite stimulant megace and adding another DM medication that will help control blood glucose and promote weight gain.  Pt considering these options and side effects. Stressed importance of regular bowel movement and to discuss bowel regimen with MD if constipation occurs Teach back method used.  Questions answered.  Monitoring, evaluation and goal: Patient will increase calories and protein to prevent further weight loss.  Next visit: Friday, November 24, during infusion

## 2015-12-29 NOTE — Assessment & Plan Note (Signed)
Take max dose metformin. Will adjust sliding scale for non-steroid days. If sugars >200 routinely she will call and we can start glipizide to help with bringing her sugars down slightly. We did also discuss the safety with this and to not take if she does not eat. If sugars <200 routinely she will still use sliding scale humalog with decadron and for several days afterwards.

## 2015-12-29 NOTE — Progress Notes (Signed)
   Subjective:    Patient ID: Leslie Rowe, female    DOB: 03-19-1935, 80 y.o.   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 She is undergoing chemo with steroids as pre-treatment. This has caused high sugars and she is now on sliding scale around steroid dosing. She is using humalog pen and having some sugars >170 daily. Since starting the insulin her sugars are down more consistently. She does not want to have to take daily insulin and wonders if there are other options. Her daughter is with her and understands that she likely will need insulin around chemo.   Review of Systems  Constitutional: Positive for activity change, appetite change, fatigue and unexpected weight change. Negative for chills and fever.  Respiratory: Positive for shortness of breath.   Cardiovascular: Negative for chest pain, palpitations and leg swelling.  Gastrointestinal: Negative for abdominal distention, abdominal pain, constipation, diarrhea and nausea.  Endocrine: Negative.   Musculoskeletal: Negative.   Neurological: Negative.       Objective:   Physical Exam  Constitutional: She is oriented to person, place, and time. She appears well-developed.  thin  HENT:  Head: Normocephalic and atraumatic.  Eyes: EOM are normal.  Cardiovascular: Normal rate and regular rhythm.   Pulmonary/Chest: Effort normal. No respiratory distress. She has no wheezes.  Abdominal: Soft. She exhibits no distension. There is no tenderness. There is no rebound.  Musculoskeletal: She exhibits no edema.  Neurological: She is alert and oriented to person, place, and time.  Skin: Skin is warm and dry.   Vitals:   12/29/15 1044  BP: (!) 152/78  Pulse: (!) 106  Resp: 14  Temp: 98 F (36.7 C)  TempSrc: Oral  SpO2: 94%  Weight: 145 lb (65.8 kg)  Height: 5\' 7"  (1.702 m)      Assessment & Plan:

## 2015-12-31 ENCOUNTER — Other Ambulatory Visit: Payer: Self-pay | Admitting: General Surgery

## 2016-01-01 ENCOUNTER — Ambulatory Visit (HOSPITAL_COMMUNITY)
Admission: RE | Admit: 2016-01-01 | Discharge: 2016-01-01 | Disposition: A | Discharge status: Home / Self Care | Payer: Medicare Other | Source: Ambulatory Visit | Attending: Oncology | Admitting: Oncology

## 2016-01-01 ENCOUNTER — Encounter (HOSPITAL_COMMUNITY): Payer: Self-pay

## 2016-01-01 ENCOUNTER — Other Ambulatory Visit: Payer: Self-pay | Admitting: Oncology

## 2016-01-01 DIAGNOSIS — Z9221 Personal history of antineoplastic chemotherapy: Secondary | ICD-10-CM | POA: Insufficient documentation

## 2016-01-01 DIAGNOSIS — C569 Malignant neoplasm of unspecified ovary: Secondary | ICD-10-CM

## 2016-01-01 DIAGNOSIS — Z808 Family history of malignant neoplasm of other organs or systems: Secondary | ICD-10-CM | POA: Diagnosis not present

## 2016-01-01 DIAGNOSIS — C786 Secondary malignant neoplasm of retroperitoneum and peritoneum: Secondary | ICD-10-CM | POA: Insufficient documentation

## 2016-01-01 DIAGNOSIS — Z833 Family history of diabetes mellitus: Secondary | ICD-10-CM | POA: Insufficient documentation

## 2016-01-01 DIAGNOSIS — Z90710 Acquired absence of both cervix and uterus: Secondary | ICD-10-CM | POA: Insufficient documentation

## 2016-01-01 DIAGNOSIS — Z87891 Personal history of nicotine dependence: Secondary | ICD-10-CM | POA: Diagnosis not present

## 2016-01-01 DIAGNOSIS — E785 Hyperlipidemia, unspecified: Secondary | ICD-10-CM | POA: Diagnosis not present

## 2016-01-01 DIAGNOSIS — I10 Essential (primary) hypertension: Secondary | ICD-10-CM | POA: Insufficient documentation

## 2016-01-01 DIAGNOSIS — Z885 Allergy status to narcotic agent status: Secondary | ICD-10-CM | POA: Diagnosis not present

## 2016-01-01 DIAGNOSIS — Z823 Family history of stroke: Secondary | ICD-10-CM | POA: Diagnosis not present

## 2016-01-01 DIAGNOSIS — Z8249 Family history of ischemic heart disease and other diseases of the circulatory system: Secondary | ICD-10-CM | POA: Diagnosis not present

## 2016-01-01 DIAGNOSIS — E119 Type 2 diabetes mellitus without complications: Secondary | ICD-10-CM | POA: Diagnosis not present

## 2016-01-01 HISTORY — PX: IR GENERIC HISTORICAL: IMG1180011

## 2016-01-01 LAB — CBC WITH DIFFERENTIAL/PLATELET
BASOS PCT: 0 %
Basophils Absolute: 0 10*3/uL (ref 0.0–0.1)
EOS PCT: 1 %
Eosinophils Absolute: 0.1 10*3/uL (ref 0.0–0.7)
HEMATOCRIT: 34.5 % — AB (ref 36.0–46.0)
HEMOGLOBIN: 10.9 g/dL — AB (ref 12.0–15.0)
LYMPHS PCT: 26 %
Lymphs Abs: 3 10*3/uL (ref 0.7–4.0)
MCH: 21.9 pg — ABNORMAL LOW (ref 26.0–34.0)
MCHC: 31.6 g/dL (ref 30.0–36.0)
MCV: 69.3 fL — ABNORMAL LOW (ref 78.0–100.0)
Monocytes Absolute: 1.4 10*3/uL — ABNORMAL HIGH (ref 0.1–1.0)
Monocytes Relative: 12 %
NEUTROS ABS: 7 10*3/uL (ref 1.7–7.7)
NEUTROS PCT: 61 %
Platelets: 349 10*3/uL (ref 150–400)
RBC: 4.98 MIL/uL (ref 3.87–5.11)
RDW: 15 % (ref 11.5–15.5)
WBC: 11.5 10*3/uL — ABNORMAL HIGH (ref 4.0–10.5)

## 2016-01-01 LAB — BASIC METABOLIC PANEL
ANION GAP: 9 (ref 5–15)
BUN: 20 mg/dL (ref 6–20)
CHLORIDE: 102 mmol/L (ref 101–111)
CO2: 25 mmol/L (ref 22–32)
CREATININE: 0.74 mg/dL (ref 0.44–1.00)
Calcium: 9.9 mg/dL (ref 8.9–10.3)
GFR calc non Af Amer: >60 mL/min (ref >60)
GLUCOSE: 151 mg/dL — AB (ref 65–99)
Potassium: 4.4 mmol/L (ref 3.5–5.1)
Sodium: 136 mmol/L (ref 135–145)

## 2016-01-01 LAB — GLUCOSE, CAPILLARY: Glucose-Capillary: 149 mg/dL — ABNORMAL HIGH (ref 65–99)

## 2016-01-01 LAB — PROTIME-INR
INR: 0.92
Prothrombin Time: 12.3 seconds (ref 11.4–15.2)

## 2016-01-01 MED ORDER — FENTANYL CITRATE (PF) 100 MCG/2ML IJ SOLN
INTRAMUSCULAR | Status: AC
  Filled 2016-01-01: qty 4

## 2016-01-01 MED ORDER — MIDAZOLAM HCL 2 MG/2ML IJ SOLN
INTRAMUSCULAR | Status: AC
  Filled 2016-01-01: qty 4

## 2016-01-01 MED ORDER — LIDOCAINE HCL 1 % IJ SOLN
INTRAMUSCULAR | Status: AC
Start: 2016-01-01 — End: 2016-01-01
  Filled 2016-01-01: qty 20

## 2016-01-01 MED ORDER — LIDOCAINE-EPINEPHRINE (PF) 2 %-1:200000 IJ SOLN
INTRAMUSCULAR | Status: AC
  Filled 2016-01-01: qty 20

## 2016-01-01 MED ORDER — SODIUM CHLORIDE 0.9 % IV SOLN
INTRAVENOUS | Status: DC
  Administered 2016-01-01: 10:00:00 via INTRAVENOUS

## 2016-01-01 MED ORDER — LIDOCAINE HCL 1 % IJ SOLN
INTRAMUSCULAR | Status: AC | PRN
  Administered 2016-01-01: 10 mL

## 2016-01-01 MED ORDER — FENTANYL CITRATE (PF) 100 MCG/2ML IJ SOLN
INTRAMUSCULAR | Status: AC | PRN
  Administered 2016-01-01: 50 ug via INTRAVENOUS

## 2016-01-01 MED ORDER — HEPARIN SOD (PORK) LOCK FLUSH 100 UNIT/ML IV SOLN
INTRAVENOUS | Status: AC
  Filled 2016-01-01: qty 5

## 2016-01-01 MED ORDER — MIDAZOLAM HCL 2 MG/2ML IJ SOLN
INTRAMUSCULAR | Status: AC | PRN
  Administered 2016-01-01: 1 mg via INTRAVENOUS

## 2016-01-01 MED ORDER — LIDOCAINE-EPINEPHRINE (PF) 2 %-1:200000 IJ SOLN
INTRAMUSCULAR | Status: AC | PRN
  Administered 2016-01-01: 10 mL

## 2016-01-01 MED ORDER — CEFAZOLIN SODIUM-DEXTROSE 2-4 GM/100ML-% IV SOLN
2.0000 g | INTRAVENOUS | Status: AC
  Administered 2016-01-01: 2 g via INTRAVENOUS
  Filled 2016-01-01: qty 100

## 2016-01-01 NOTE — Sedation Documentation (Signed)
Vital signs stable. 

## 2016-01-01 NOTE — Sedation Documentation (Signed)
Pt tolerated procedure well, transferred to [facility]

## 2016-01-01 NOTE — Discharge Instructions (Signed)
Moderate Conscious Sedation, Adult, Care After °Refer to this sheet in the next few weeks. These instructions provide you with information on caring for yourself after your procedure. Your health care provider may also give you more specific instructions. Your treatment has been planned according to current medical practices, but problems sometimes occur. Call your health care provider if you have any problems or questions after your procedure. °WHAT TO EXPECT AFTER THE PROCEDURE  °After your procedure: °· You may feel sleepy, clumsy, and have poor balance for several hours. °· Vomiting may occur if you eat too soon after the procedure. °HOME CARE INSTRUCTIONS °· Do not participate in any activities where you could become injured for at least 24 hours. Do not: °¨ Drive. °¨ Swim. °¨ Ride a bicycle. °¨ Operate heavy machinery. °¨ Cook. °¨ Use power tools. °¨ Climb ladders. °¨ Work from a high place. °· Do not make important decisions or sign legal documents until you are improved. °· If you vomit, drink water, juice, or soup when you can drink without vomiting. Make sure you have little or no nausea before eating solid foods. °· Only take over-the-counter or prescription medicines for pain, discomfort, or fever as directed by your health care provider. °· Make sure you and your family fully understand everything about the medicines given to you, including what side effects may occur. °· You should not drink alcohol, take sleeping pills, or take medicines that cause drowsiness for at least 24 hours. °· If you smoke, do not smoke without supervision. °· If you are feeling better, you may resume normal activities 24 hours after you were sedated. °· Keep all appointments with your health care provider. °SEEK MEDICAL CARE IF: °· Your skin is pale or bluish in color. °· You continue to feel nauseous or vomit. °· Your pain is getting worse and is not helped by medicine. °· You have bleeding or swelling. °· You are still  sleepy or feeling clumsy after 24 hours. °SEEK IMMEDIATE MEDICAL CARE IF: °· You develop a rash. °· You have difficulty breathing. °· You develop any type of allergic problem. °· You have a fever. °MAKE SURE YOU: °· Understand these instructions. °· Will watch your condition. °· Will get help right away if you are not doing well or get worse. °  °This information is not intended to replace advice given to you by your health care provider. Make sure you discuss any questions you have with your health care provider. °  °Document Released: 12/06/2012 Document Revised: 03/08/2014 Document Reviewed: 12/06/2012 °Elsevier Interactive Patient Education ©2016 [employer] °Implanted Port Insertion, Care After °Refer to this sheet in the next few weeks. These instructions provide you with information on caring for yourself after your procedure. Your health care provider may also give you more specific instructions. Your treatment has been planned according to current medical practices, but problems sometimes occur. Call your health care provider if you have any problems or questions after your procedure. °WHAT TO EXPECT AFTER THE PROCEDURE °After your procedure, it is typical to have the following:  °· Discomfort at the port insertion site. Ice packs to the area will help. °· Bruising on the skin over the port. This will subside in 3-4 days. °HOME CARE INSTRUCTIONS °· After your port is placed, you will get a manufacturer's information card. The card has information about your port. Keep this card with you at all times.   °· Know what kind of port you have. There are many types   of ports available.   °· Wear a medical alert bracelet in case of an emergency. This can help alert health care workers that you have a port.   °· The port can stay in for as long as your health care provider believes it is necessary.   °· A home health care nurse may give medicines and take care of the port.   °· You or a family member can get  special training and directions for giving medicine and taking care of the port at home.   °SEEK MEDICAL CARE IF:  °· Your port does not flush or you are unable to get a blood return.   °· You have a fever or chills. °SEEK IMMEDIATE MEDICAL CARE IF: °· You have new fluid or pus coming from your incision.   °· You notice a bad smell coming from your incision site.   °· You have swelling, pain, or more redness at the incision or port site.   °· You have chest pain or shortness of breath. °  °This information is not intended to replace advice given to you by your health care provider. Make sure you discuss any questions you have with your health care provider. °  °Document Released: 12/06/2012 Document Revised: 02/20/2013 Document Reviewed: 12/06/2012 °Elsevier Interactive Patient Education ©2016 [employer] °Implanted Port Home Guide °An implanted port is a type of central line that is placed under the skin. Central lines are used to provide IV access when treatment or nutrition needs to be given through a person's veins. Implanted ports are used for long-term IV access. An implanted port may be placed because:  °· You need IV medicine that would be irritating to the small veins in your hands or arms.   °· You need long-term IV medicines, such as antibiotics.   °· You need IV nutrition for a long period.   °· You need frequent blood draws for lab tests.   °· You need dialysis.   °Implanted ports are usually placed in the chest area, but they can also be placed in the upper arm, the abdomen, or the leg. An implanted port has two main parts:  °· Reservoir. The reservoir is round and will appear as a small, raised area under your skin. The reservoir is the part where a needle is inserted to give medicines or draw blood.   °· Catheter. The catheter is a thin, flexible tube that extends from the reservoir. The catheter is placed into a large vein. Medicine that is inserted into the reservoir goes into the catheter and  then into the vein.   °HOW WILL I CARE FOR MY INCISION SITE? °Do not get the incision site wet. Bathe or shower as directed by your health care provider.  °HOW IS MY PORT ACCESSED? °Special steps must be taken to access the port:  °· Before the port is accessed, a numbing cream can be placed on the skin. This helps numb the skin over the port site.   °· Your health care provider uses a sterile technique to access the port. °· Your health care provider must put on a mask and sterile gloves. °· The skin over your port is cleaned carefully with an antiseptic and allowed to dry. °· The port is gently pinched between sterile gloves, and a needle is inserted into the port. °· Only "non-coring" port needles should be used to access the port. Once the port is accessed, a blood return should be checked. This helps ensure that the port is in the vein and is not clogged.   °· If your port   needs to remain accessed for a constant infusion, a clear (transparent) bandage will be placed over the needle site. The bandage and needle will need to be changed every week, or as directed by your health care provider.   °· Keep the bandage covering the needle clean and dry. Do not get it wet. Follow your health care provider's instructions on how to take a shower or bath while the port is accessed.   °· If your port does not need to stay accessed, no bandage is needed over the port.   °WHAT IS FLUSHING? °Flushing helps keep the port from getting clogged. Follow your health care provider's instructions on how and when to flush the port. Ports are usually flushed with saline solution or a medicine called heparin. The need for flushing will depend on how the port is used.  °· If the port is used for intermittent medicines or blood draws, the port will need to be flushed:   °· After medicines have been given.   °· After blood has been drawn.   °· As part of routine maintenance.   °· If a constant infusion is running, the port may not need to  be flushed.   °HOW LONG WILL MY PORT STAY IMPLANTED? °The port can stay in for as long as your health care provider thinks it is needed. When it is time for the port to come out, surgery will be done to remove it. The procedure is similar to the one performed when the port was put in.  °WHEN SHOULD I SEEK IMMEDIATE MEDICAL CARE? °When you have an implanted port, you should seek immediate medical care if:  °· You notice a bad smell coming from the incision site.   °· You have swelling, redness, or drainage at the incision site.   °· You have more swelling or pain at the port site or the surrounding area.   °· You have a fever that is not controlled with medicine. °  °This information is not intended to replace advice given to you by your health care provider. Make sure you discuss any questions you have with your health care provider. °  °Document Released: 02/15/2005 Document Revised: 12/06/2012 Document Reviewed: 10/23/2012 °Elsevier Interactive Patient Education ©2016 [employer] Inc. ° °

## 2016-01-01 NOTE — Procedures (Signed)
Metastatic ovarian ca  S/p RT IJ POWER PORT  Tip svcra No comp Stable Ready for use EBL<5cc  Full report in PACS

## 2016-01-01 NOTE — Sedation Documentation (Signed)
Patient is resting comfortably. No complaints from pt at this time.  

## 2016-01-01 NOTE — Sedation Documentation (Signed)
Patient is resting comfortably. 

## 2016-01-01 NOTE — Sedation Documentation (Signed)
Patient is resting comfortably. No complaints at this time, pt tolerating procedure well.

## 2016-01-01 NOTE — Sedation Documentation (Signed)
Patient is resting comfortably. No complaints from pt.

## 2016-01-01 NOTE — H&P (Signed)
    Chief Complaint: peritoneal malignancy  Referring Physician:Dr. Evlyn Clines  Supervising Physician: Daryll Brod  Patient Status: [facility] - Out-pt  HPI: Leslie Rowe is an 80 y.o. female who we saw in September of this year for an omental biopsy.  This was found to be adenocarcinoma c/w ovarian source.  She has started chemotherapy, but will need a second round.  A PAC has been requested and she presents today for this procedure.  She has no complaints and is otherwise doing fairly well.  Past Medical History:  Past Medical History:  Diagnosis Date  . Diabetes mellitus    type II, mild  . Hyperlipidemia   . Hypertension    borderline    Past Surgical History:  Past Surgical History:  Procedure Laterality Date  . ABDOMINAL HYSTERECTOMY     total    Family History:  Family History  Problem Relation Age of Onset  . Hypertension Mother   . Other Mother     CVa  . Stroke Mother   . Cancer Brother     Bone  . Other Son     Autistic  . Hypertension Father   . Stroke Father   . Hypertension Sister   . Diabetes Brother     Social History:  reports that she quit smoking about 40 years ago. She has never used smokeless tobacco. She reports that she does not drink alcohol or use drugs.  Allergies:  Allergies  Allergen Reactions  . Codeine Nausea And Vomiting    Medications: Medications reviewed in Epic  Please HPI for pertinent positives, otherwise complete 10 system ROS negative.  Mallampati Score: MD Evaluation Airway: WNL Heart: WNL Abdomen: WNL Chest/ Lungs: WNL ASA  Classification: 2 Mallampati/Airway Score: Two  Physical Exam: BP 132/83 (BP Location: Right Arm)   Pulse 93   Temp 98.3 F (36.8 C) (Oral)   Resp 16   SpO2 98%  There is no height or weight on file to calculate BMI. General: pleasant, WD, WN black female who is laying in bed in NAD HEENT: head is covered with a hat.  Sclera are noninjected.  PERRL.  Ears and nose without any  masses or lesions.  Mouth is pink and moist Heart: regular, rate, and rhythm.  Normal s1,s2. No obvious murmurs, gallops, or rubs noted.  Palpable radial and pedal pulses bilaterally Lungs: CTAB, no wheezes, rhonchi, or rales noted.  Respiratory effort nonlabored Abd: soft, NT, ND, +BS, no masses, hernias, or organomegaly Psych: A&Ox3 with an appropriate affect.   Labs: Pending  Imaging: No results found.  Assessment/Plan 1. Ovarian cancer with peritoneal mets -we will plan to place a PAC today so she can begin her second round of chemotherapy. -vitals reviewed.  Labs are pending -Risks and Benefits discussed with the patient including, but not limited to bleeding, infection, pneumothorax, or fibrin sheath development and need for additional procedures. All of the patient's questions were answered, patient is agreeable to proceed. Consent signed and in chart.   Thank you for this interesting consult.  I greatly enjoyed meeting GINETTA WAYLAND and look forward to participating in their care.  A copy of this report was sent to the requesting provider on this date.  Electronically Signed: Henreitta Cea 01/01/2016, 10:03 AM   I spent a total of    25 Minutes in face to face in clinical consultation, greater than 50% of which was counseling/coordinating care for ovarian cancer, poor venous access

## 2016-01-02 ENCOUNTER — Ambulatory Visit: Payer: Medicare Other

## 2016-01-02 ENCOUNTER — Other Ambulatory Visit (HOSPITAL_BASED_OUTPATIENT_CLINIC_OR_DEPARTMENT_OTHER): Payer: Medicare Other

## 2016-01-02 ENCOUNTER — Ambulatory Visit (HOSPITAL_BASED_OUTPATIENT_CLINIC_OR_DEPARTMENT_OTHER): Payer: Medicare Other

## 2016-01-02 VITALS — BP 135/66 | HR 100 | Temp 98.3°F | Resp 17

## 2016-01-02 DIAGNOSIS — C7801 Secondary malignant neoplasm of right lung: Secondary | ICD-10-CM | POA: Diagnosis not present

## 2016-01-02 DIAGNOSIS — C786 Secondary malignant neoplasm of retroperitoneum and peritoneum: Secondary | ICD-10-CM | POA: Diagnosis not present

## 2016-01-02 DIAGNOSIS — C801 Malignant (primary) neoplasm, unspecified: Secondary | ICD-10-CM

## 2016-01-02 DIAGNOSIS — C482 Malignant neoplasm of peritoneum, unspecified: Secondary | ICD-10-CM

## 2016-01-02 DIAGNOSIS — E119 Type 2 diabetes mellitus without complications: Secondary | ICD-10-CM

## 2016-01-02 DIAGNOSIS — C569 Malignant neoplasm of unspecified ovary: Secondary | ICD-10-CM

## 2016-01-02 DIAGNOSIS — Z5111 Encounter for antineoplastic chemotherapy: Secondary | ICD-10-CM

## 2016-01-02 DIAGNOSIS — C7802 Secondary malignant neoplasm of left lung: Secondary | ICD-10-CM

## 2016-01-02 LAB — CBC WITH DIFFERENTIAL/PLATELET
BASO%: 0.1 % (ref 0.0–2.0)
Basophils Absolute: 0 10*3/uL (ref 0.0–0.1)
EOS%: 0.1 % (ref 0.0–7.0)
Eosinophils Absolute: 0 10*3/uL (ref 0.0–0.5)
HCT: 30.6 % — ABNORMAL LOW (ref 34.8–46.6)
HGB: 9.9 g/dL — ABNORMAL LOW (ref 11.6–15.9)
LYMPH%: 10.8 % — AB (ref 14.0–49.7)
MCH: 22 pg — AB (ref 25.1–34.0)
MCHC: 32.4 g/dL (ref 31.5–36.0)
MCV: 67.8 fL — ABNORMAL LOW (ref 79.5–101.0)
MONO#: 0.2 10*3/uL (ref 0.1–0.9)
MONO%: 1.6 % (ref 0.0–14.0)
NEUT%: 87.4 % — AB (ref 38.4–76.8)
NEUTROS ABS: 12.2 10*3/uL — AB (ref 1.5–6.5)
Platelets: 277 10*3/uL (ref 145–400)
RBC: 4.51 10*6/uL (ref 3.70–5.45)
RDW: 15 % — ABNORMAL HIGH (ref 11.2–14.5)
WBC: 13.9 10*3/uL — AB (ref 3.9–10.3)
lymph#: 1.5 10*3/uL (ref 0.9–3.3)
nRBC: 0 % (ref 0–0)

## 2016-01-02 LAB — COMPREHENSIVE METABOLIC PANEL
ALT: 27 U/L (ref 0–55)
AST: 32 U/L (ref 5–34)
Albumin: 3.5 g/dL (ref 3.5–5.0)
Alkaline Phosphatase: 99 U/L (ref 40–150)
Anion Gap: 12 mEq/L — ABNORMAL HIGH (ref 3–11)
BUN: 21.2 mg/dL (ref 7.0–26.0)
CALCIUM: 9.5 mg/dL (ref 8.4–10.4)
CHLORIDE: 103 meq/L (ref 98–109)
CO2: 20 meq/L — AB (ref 22–29)
CREATININE: 0.9 mg/dL (ref 0.6–1.1)
EGFR: 67 mL/min/{1.73_m2} — ABNORMAL LOW (ref >90)
GLUCOSE: 410 mg/dL — AB (ref 70–140)
POTASSIUM: 4.3 meq/L (ref 3.5–5.1)
SODIUM: 136 meq/L (ref 136–145)
Total Bilirubin: 0.24 mg/dL (ref 0.20–1.20)
Total Protein: 7.2 g/dL (ref 6.4–8.3)

## 2016-01-02 LAB — WHOLE BLOOD GLUCOSE
Glucose: 376 mg/dL — ABNORMAL HIGH (ref 70–100)
HRS PC: 1 Hours

## 2016-01-02 MED ORDER — FAMOTIDINE IN NACL 20-0.9 MG/50ML-% IV SOLN
20.0000 mg | Freq: Once | INTRAVENOUS | Status: AC
  Administered 2016-01-02: 20 mg via INTRAVENOUS

## 2016-01-02 MED ORDER — SODIUM CHLORIDE 0.9% FLUSH
10.0000 mL | INTRAVENOUS | Status: DC | PRN
  Administered 2016-01-02: 10 mL
  Filled 2016-01-02: qty 10

## 2016-01-02 MED ORDER — FAMOTIDINE IN NACL 20-0.9 MG/50ML-% IV SOLN
INTRAVENOUS | Status: AC
  Filled 2016-01-02: qty 50

## 2016-01-02 MED ORDER — DIPHENHYDRAMINE HCL 50 MG/ML IJ SOLN
INTRAMUSCULAR | Status: AC
  Filled 2016-01-02: qty 1

## 2016-01-02 MED ORDER — DIPHENHYDRAMINE HCL 50 MG/ML IJ SOLN
25.0000 mg | Freq: Once | INTRAMUSCULAR | Status: AC
  Administered 2016-01-02: 25 mg via INTRAVENOUS

## 2016-01-02 MED ORDER — SODIUM CHLORIDE 0.9 % IV SOLN
Freq: Once | INTRAVENOUS | Status: AC
  Administered 2016-01-02: 11:00:00 via INTRAVENOUS

## 2016-01-02 MED ORDER — INSULIN REGULAR HUMAN 100 UNIT/ML IJ SOLN
8.0000 [IU] | Freq: Once | INTRAMUSCULAR | Status: AC
  Administered 2016-01-02: 8 [IU] via SUBCUTANEOUS
  Filled 2016-01-02: qty 0.08

## 2016-01-02 MED ORDER — SODIUM CHLORIDE 0.9 % IV SOLN
454.2000 mg | Freq: Once | INTRAVENOUS | Status: AC
  Administered 2016-01-02: 450 mg via INTRAVENOUS
  Filled 2016-01-02: qty 45

## 2016-01-02 MED ORDER — ONDANSETRON HCL 8 MG PO TABS
8.0000 mg | ORAL_TABLET | Freq: Once | ORAL | Status: AC
Start: 2016-01-02 — End: 2016-01-02
  Administered 2016-01-02: 8 mg via ORAL

## 2016-01-02 MED ORDER — HEPARIN SOD (PORK) LOCK FLUSH 100 UNIT/ML IV SOLN
500.0000 [IU] | Freq: Once | INTRAVENOUS | Status: AC | PRN
  Administered 2016-01-02: 500 [IU]
  Filled 2016-01-02: qty 5

## 2016-01-02 MED ORDER — SODIUM CHLORIDE 0.9 % IV SOLN: Freq: Once | INTRAVENOUS | Status: DC

## 2016-01-02 MED ORDER — INSULIN REGULAR HUMAN 100 UNIT/ML IJ SOLN
8.0000 [IU] | Freq: Once | INTRAMUSCULAR | Status: AC | PRN
  Administered 2016-01-02: 8 [IU] via SUBCUTANEOUS
  Filled 2016-01-02: qty 0.08

## 2016-01-02 MED ORDER — SODIUM CHLORIDE 0.9 % IV SOLN
Freq: Once | INTRAVENOUS | Status: AC
  Administered 2016-01-02: 11:00:00 via INTRAVENOUS
  Filled 2016-01-02: qty 5

## 2016-01-02 MED ORDER — ONDANSETRON HCL 8 MG PO TABS
ORAL_TABLET | ORAL | Status: AC
  Filled 2016-01-02: qty 1

## 2016-01-02 MED ORDER — PACLITAXEL CHEMO INJECTION 300 MG/50ML
175.0000 mg/m2 | Freq: Once | INTRAVENOUS | Status: AC
  Administered 2016-01-02: 318 mg via INTRAVENOUS
  Filled 2016-01-02: qty 53

## 2016-01-02 NOTE — Progress Notes (Signed)
Per Dr Marko Plume give patient another dose of insulin based on glucose of 376 prior to leaving today.  Use parameters in treatment plan order.

## 2016-01-02 NOTE — Patient Instructions (Signed)
[facility] Discharge Instructions for Patients Receiving Chemotherapy  Today you received the following chemotherapy agents:  TAxol, Carboplatin  To help prevent nausea and vomiting after your treatment, we encourage you to take your nausea medication as prescribed.   If you develop nausea and vomiting that is not controlled by your nausea medication, call the clinic.   BELOW ARE SYMPTOMS THAT SHOULD BE REPORTED IMMEDIATELY:  *FEVER GREATER THAN 100.5 F  *CHILLS WITH OR WITHOUT FEVER  NAUSEA AND VOMITING THAT IS NOT CONTROLLED WITH YOUR NAUSEA MEDICATION  *UNUSUAL SHORTNESS OF BREATH  *UNUSUAL BRUISING OR BLEEDING  TENDERNESS IN MOUTH AND THROAT WITH OR WITHOUT PRESENCE OF ULCERS  *URINARY PROBLEMS  *BOWEL PROBLEMS  UNUSUAL RASH Items with * indicate a potential emergency and should be followed up as soon as possible.  Feel free to call the clinic you have any questions or concerns. The clinic phone number is [phone].  Please show the [facility] at check-in to the Emergency Department and triage nurse.

## 2016-01-04 ENCOUNTER — Other Ambulatory Visit: Payer: Self-pay | Admitting: Oncology

## 2016-01-04 DIAGNOSIS — C569 Malignant neoplasm of unspecified ovary: Secondary | ICD-10-CM

## 2016-01-04 DIAGNOSIS — C482 Malignant neoplasm of peritoneum, unspecified: Secondary | ICD-10-CM

## 2016-01-07 ENCOUNTER — Ambulatory Visit (HOSPITAL_BASED_OUTPATIENT_CLINIC_OR_DEPARTMENT_OTHER): Payer: Medicare Other

## 2016-01-07 VITALS — BP 120/74 | HR 98 | Temp 97.7°F | Resp 18

## 2016-01-07 DIAGNOSIS — C7801 Secondary malignant neoplasm of right lung: Secondary | ICD-10-CM | POA: Diagnosis not present

## 2016-01-07 DIAGNOSIS — C801 Malignant (primary) neoplasm, unspecified: Secondary | ICD-10-CM

## 2016-01-07 DIAGNOSIS — C7802 Secondary malignant neoplasm of left lung: Secondary | ICD-10-CM | POA: Diagnosis not present

## 2016-01-07 DIAGNOSIS — C786 Secondary malignant neoplasm of retroperitoneum and peritoneum: Secondary | ICD-10-CM | POA: Diagnosis not present

## 2016-01-07 MED ORDER — TBO-FILGRASTIM 300 MCG/0.5ML ~~LOC~~ SOSY
300.0000 ug | PREFILLED_SYRINGE | Freq: Once | SUBCUTANEOUS | Status: AC
  Administered 2016-01-07: 300 ug via SUBCUTANEOUS
  Filled 2016-01-07: qty 0.5

## 2016-01-07 NOTE — Patient Instructions (Signed)

## 2016-01-08 ENCOUNTER — Ambulatory Visit (HOSPITAL_BASED_OUTPATIENT_CLINIC_OR_DEPARTMENT_OTHER): Payer: Medicare Other

## 2016-01-08 ENCOUNTER — Encounter: Payer: Self-pay | Admitting: Oncology

## 2016-01-08 ENCOUNTER — Other Ambulatory Visit (HOSPITAL_BASED_OUTPATIENT_CLINIC_OR_DEPARTMENT_OTHER): Payer: Medicare Other

## 2016-01-08 ENCOUNTER — Telehealth: Payer: Self-pay

## 2016-01-08 ENCOUNTER — Ambulatory Visit (HOSPITAL_BASED_OUTPATIENT_CLINIC_OR_DEPARTMENT_OTHER): Payer: Medicare Other | Admitting: Oncology

## 2016-01-08 VITALS — BP 128/72 | HR 114 | Temp 98.0°F | Resp 18 | Ht 67.0 in | Wt 143.5 lb

## 2016-01-08 DIAGNOSIS — C801 Malignant (primary) neoplasm, unspecified: Secondary | ICD-10-CM

## 2016-01-08 DIAGNOSIS — C786 Secondary malignant neoplasm of retroperitoneum and peritoneum: Secondary | ICD-10-CM

## 2016-01-08 DIAGNOSIS — C482 Malignant neoplasm of peritoneum, unspecified: Secondary | ICD-10-CM

## 2016-01-08 DIAGNOSIS — C7802 Secondary malignant neoplasm of left lung: Secondary | ICD-10-CM

## 2016-01-08 DIAGNOSIS — Z95828 Presence of other vascular implants and grafts: Secondary | ICD-10-CM | POA: Insufficient documentation

## 2016-01-08 DIAGNOSIS — C7801 Secondary malignant neoplasm of right lung: Secondary | ICD-10-CM

## 2016-01-08 DIAGNOSIS — D701 Agranulocytosis secondary to cancer chemotherapy: Secondary | ICD-10-CM

## 2016-01-08 DIAGNOSIS — E119 Type 2 diabetes mellitus without complications: Secondary | ICD-10-CM

## 2016-01-08 DIAGNOSIS — T451X5A Adverse effect of antineoplastic and immunosuppressive drugs, initial encounter: Secondary | ICD-10-CM

## 2016-01-08 DIAGNOSIS — C569 Malignant neoplasm of unspecified ovary: Secondary | ICD-10-CM

## 2016-01-08 DIAGNOSIS — Z5189 Encounter for other specified aftercare: Secondary | ICD-10-CM

## 2016-01-08 LAB — CBC WITH DIFFERENTIAL/PLATELET
BASO%: 0.2 % (ref 0.0–2.0)
Basophils Absolute: 0 10*3/uL (ref 0.0–0.1)
EOS%: 0.1 % (ref 0.0–7.0)
Eosinophils Absolute: 0 10*3/uL (ref 0.0–0.5)
HEMATOCRIT: 30.8 % — AB (ref 34.8–46.6)
HGB: 9.6 g/dL — ABNORMAL LOW (ref 11.6–15.9)
LYMPH#: 1.2 10*3/uL (ref 0.9–3.3)
LYMPH%: 5.8 % — ABNORMAL LOW (ref 14.0–49.7)
MCH: 21.1 pg — ABNORMAL LOW (ref 25.1–34.0)
MCHC: 31.1 g/dL — AB (ref 31.5–36.0)
MCV: 67.8 fL — ABNORMAL LOW (ref 79.5–101.0)
MONO#: 0.2 10*3/uL (ref 0.1–0.9)
MONO%: 0.9 % (ref 0.0–14.0)
NEUT#: 19.7 10*3/uL — ABNORMAL HIGH (ref 1.5–6.5)
NEUT%: 93 % — AB (ref 38.4–76.8)
Platelets: 197 10*3/uL (ref 145–400)
RBC: 4.54 10*6/uL (ref 3.70–5.45)
RDW: 14.1 % (ref 11.2–14.5)
WBC: 21.2 10*3/uL — AB (ref 3.9–10.3)

## 2016-01-08 LAB — COMPREHENSIVE METABOLIC PANEL
ALT: 39 U/L (ref 0–55)
AST: 35 U/L — AB (ref 5–34)
Albumin: 3.4 g/dL — ABNORMAL LOW (ref 3.5–5.0)
Alkaline Phosphatase: 92 U/L (ref 40–150)
Anion Gap: 11 mEq/L (ref 3–11)
BUN: 27.6 mg/dL — AB (ref 7.0–26.0)
CALCIUM: 10.1 mg/dL (ref 8.4–10.4)
CHLORIDE: 99 meq/L (ref 98–109)
CO2: 26 meq/L (ref 22–29)
CREATININE: 0.9 mg/dL (ref 0.6–1.1)
EGFR: 70 mL/min/{1.73_m2} — ABNORMAL LOW (ref >90)
Glucose: 276 mg/dl — ABNORMAL HIGH (ref 70–140)
Potassium: 4.4 mEq/L (ref 3.5–5.1)
Sodium: 136 mEq/L (ref 136–145)
Total Bilirubin: 0.59 mg/dL (ref 0.20–1.20)
Total Protein: 6.9 g/dL (ref 6.4–8.3)

## 2016-01-08 MED ORDER — HEMOCYTE 324 (106 FE) MG PO TABS: 1.0000 | ORAL_TABLET | Freq: Every day | ORAL | 2 refills | Status: DC

## 2016-01-08 MED ORDER — SODIUM CHLORIDE 0.9% FLUSH
10.0000 mL | INTRAVENOUS | Status: DC | PRN
Start: 2016-01-08 — End: 2016-01-08
  Administered 2016-01-08: 10 mL via INTRAVENOUS
  Filled 2016-01-08: qty 10

## 2016-01-08 MED ORDER — TBO-FILGRASTIM 300 MCG/0.5ML ~~LOC~~ SOSY
300.0000 ug | PREFILLED_SYRINGE | Freq: Once | SUBCUTANEOUS | Status: AC
  Administered 2016-01-08: 300 ug via SUBCUTANEOUS
  Filled 2016-01-08: qty 0.5

## 2016-01-08 MED ORDER — HEPARIN SOD (PORK) LOCK FLUSH 100 UNIT/ML IV SOLN
500.0000 [IU] | Freq: Once | INTRAVENOUS | Status: AC | PRN
  Administered 2016-01-08: 500 [IU] via INTRAVENOUS
  Filled 2016-01-08: qty 5

## 2016-01-08 NOTE — Telephone Encounter (Signed)
-----   Message from Gordy Levan, MD sent at 01/08/2016  1:08 PM EST ----- Regarding: hemocyte Hemocyte or ferrous fumarate DAW  325 mg once daily on empty stomach with OJ #30 2 RF Tell pharmacist ferrous fumarate if ins does not cover hemocyte  thanks

## 2016-01-08 NOTE — Patient Instructions (Signed)

## 2016-01-08 NOTE — Progress Notes (Signed)
OFFICE PROGRESS NOTE   January 08, 2016   Physicians: P.Gehrig, Hillard Danker, (C.Gessner) (M.Norins)  INTERVAL HISTORY:  Patient is seen, together with daughter, in continuing attention to clinical IVB gyn adenocarcinoma for which she is receiving carboplatin taxol as initial intervention. The gyn malignancy is likely ovarian vs primary peritoneal , extensive in pelvis, omentum and numerous bilateral pulmonary nodules. She had cycle 2 carbo taxol on 01-02-16, with granix 11-8 and 11-9 (neutropenic day 14 cycle 1) Plan restaging scans after cycle 3. May be consideration of surgery by gyn oncology, tho note clinically with extensive pulmonary mets.   She had follow up with PCP Dr Okey Dupre on 12-29-15, adjustments in metformin and SS coverage for chemo steroids.  Patient tolerated cycle 2 carbo taxol some better overall than first treatment, tho she was fatigued this week and had nausea with vomiting last pm, seemed related to some increased constipation. She had knee pain after first granix on 11-8, will continue claritin today. She is eating high protein foods, daughter very helpful with this. Abdomen less distended, easily able to sleep on her side now. No SOB or cough, no chest pain. No fever or symptoms of infection. No LE swelling. No bleeding. PAC functioned well for chemo and blood draw today. No significant peripheral neuropathy. Remainder of 10 point Review of Systems negative.   Flu vaccine 10-28-15 PAC placed by IR 01-01-16 Genetics counseling scheduled 01-27-16  ONCOLOGIC HISTORY Patient developed some mild GI symptoms in past 3-4 months, initially thought related to the diabetes medications. Symptoms of constipation, early satiety and abdominal fullness and general abdominal soreness persisted, with CT AP 11-07-15 found bibasilar pulmonary nodules and small right pleural effusion; uterus surgically absent, right adnexal solid and cystic mass5.8 x 3.7 x 4.7 cm; predominantly cystic  mass with smaller soft tissue component in left adnexa6.1 x 2.8 x 7.0 cm; diffuse omental tumor infiltration up to 3 cm thick;tumor deposits are seen in the pericolic gutters bilaterally; large tumor deposit at the cul de sac at least 4.1 cm diameter; probable tumor deposits along small bowel loops and colon in pelvis. CA 125 on 11-12-15 was 301.8, with CEA 8.3. She was seen by Dr Duard Brady on 11-14-15, with omental biopsy 11-14-15 ([id number]) adenocarcinoma consistent with metastatic ovarian. Recommendation was for neoadjuvant chemotherapy with consideration of surgery by gyn oncology after ~ cycle 3 depending on response Cycle 1 carbo taxol given 12-12-15.  She was neutropenic with ANC 0.8 on day 14 cycle 1. CT chest 12-10-15 showed multiple bilateral pulmonary mets and right pleural effusion, tho not enough pleural fluid to tap.     Objective:  Vital signs in last 24 hours:  BP 128/72 (BP Location: Right Arm, Patient Position: Sitting)   Pulse (!) 114   Temp 98 F (36.7 C) (Oral)   Resp 18   Ht 5\' 7"  (1.702 m)   Wt 143 lb 8 oz (65.1 kg)   SpO2 98%   BMI 22.48 kg/m  Weight down 4 lbs. Looks comfortable, respirations not labored. Alert, oriented and appropriate. Ambulatory without difficulty. Alopecia  HEENT:PERRL, sclerae not icteric. Oral mucosa moist without lesions, posterior pharynx clear.  Neck supple. No JVD.  Lymphatics:no cervical,supraclavicular, axillary or inguinal adenopathy Resp: clear to auscultation to lower fields bilaterally and normal percussion bilaterally Cardio: regular rate and rhythm. No gallop. GI: soft, nontender, less distended upper abdomen, no mass or organomegaly, no fluid wave. Normally active bowel sounds.   Musculoskeletal/ Extremities: LE without pitting edema, cords, tenderness. Back not  tender Neuro: no significant peripheral neuropathy. Otherwise nonfocal. PSYCH appropriate mood and affect Skin without rash, ecchymosis, petechiae Portacath- good  position, surgical glue in place, appears to be healing well  Lab Results:  Results for orders placed or performed in visit on 01/08/16  CBC with Differential  Result Value Ref Range   WBC 21.2 (H) 3.9 - 10.3 10e3/uL   NEUT# 19.7 (H) 1.5 - 6.5 10e3/uL   HGB 9.6 (L) 11.6 - 15.9 g/dL   HCT 91.3 (L) 64.0 - 62.5 %   Platelets 197 145 - 400 10e3/uL   MCV 67.8 (L) 79.5 - 101.0 fL   MCH 21.1 (L) 25.1 - 34.0 pg   MCHC 31.1 (L) 31.5 - 36.0 g/dL   RBC 5.29 8.47 - 1.15 10e6/uL   RDW 14.1 11.2 - 14.5 %   lymph# 1.2 0.9 - 3.3 10e3/uL   MONO# 0.2 0.1 - 0.9 10e3/uL   Eosinophils Absolute 0.0 0.0 - 0.5 10e3/uL   Basophils Absolute 0.0 0.0 - 0.1 10e3/uL   NEUT% 93.0 (H) 38.4 - 76.8 %   LYMPH% 5.8 (L) 14.0 - 49.7 %   MONO% 0.9 0.0 - 14.0 %   EOS% 0.1 0.0 - 7.0 %   BASO% 0.2 0.0 - 2.0 %  Comprehensive metabolic panel  Result Value Ref Range   Sodium 136 136 - 145 mEq/L   Potassium 4.4 3.5 - 5.1 mEq/L   Chloride 99 98 - 109 mEq/L   CO2 26 22 - 29 mEq/L   Glucose 276 (H) 70 - 140 mg/dl   BUN 65.9 (H) 7.0 - 64.4 mg/dL   Creatinine 0.9 0.6 - 1.1 mg/dL   Total Bilirubin 3.66 0.20 - 1.20 mg/dL   Alkaline Phosphatase 92 40 - 150 U/L   AST 35 (H) 5 - 34 U/L   ALT 39 0 - 55 U/L   Total Protein 6.9 6.4 - 8.3 g/dL   Albumin 3.4 (L) 3.5 - 5.0 g/dL   Calcium 72.8 8.4 - 62.6 mg/dL   Anion Gap 11 3 - 11 mEq/L   EGFR 70 (L) >90 ml/min/1.73 m2   Will repeat CA 125 with labs 01-19-16, this 739 on 12-25-15.   Studies/Results:  No results found.  Medications: I have reviewed the patient's current medications. Begin Hemocyte or ferrous fumarate 325 mg daily on empty stomach with OJ or vit C tablet. Second granix today. Will add additional if counts low with next labs Continue daily laxatives, increase as needed to keep bowels moving well daily.   DISCUSSION Clinically improving on carbo taxol, counts not at nadir from cycle 2.  Discussed adjusting activity for fatigue, diet.  They will call prior  to next MD visit + lab on 11-20 if concerns; cycle 3 due 11-24.  We have not discussed medical oncology change in Jan.  Assessment/Plan:  1. 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 Clinically improving, plan as above 2.Diabetes on metformin and SS insulin: Appreciate help from Dr Okey Dupre.  Decadron around chemo as above, SS insulin with chemo infusion here. [facility] dietician to see, po intake some better now. 3.PAC by IR 01-01-16. Patient very pleased with this. 4.Constipation: adjusting miralax to keep bowels moving well daily 5.post hysterectomy without oophorectomy ~ 1970 6.flu vaccine 10-28-15 7.work noise exposure in past with decreased hearing 8.minimal past tobacco, DCd 1977. Second hand tobacco exposure. 9.Living WIll  and HCPOA in place. Social: daughter with 15 yo twins has just moved from [place] to be with patient, children in school in [place] now. Appreciate support from [facility] chaplain  10.environmental allergies: better with flonase to ongoing claritin   All questions answered and patient/ daughter in agreement with plans above.. Granix orders confirmed. Time spent 25 min including >50% counseling and coordination of care. Route Dr Sharlet Salina, update gyn onc   Evlyn Clines, MD   01/08/2016, 5:35 PM

## 2016-01-09 ENCOUNTER — Ambulatory Visit: Payer: Medicare Other

## 2016-01-12 ENCOUNTER — Encounter: Payer: Medicare Other | Admitting: Nutrition

## 2016-01-12 ENCOUNTER — Encounter: Payer: Self-pay | Admitting: Nutrition

## 2016-01-12 NOTE — Progress Notes (Signed)
Patient did not show up for nutrition appointment. 

## 2016-01-18 ENCOUNTER — Other Ambulatory Visit: Payer: Self-pay | Admitting: Oncology

## 2016-01-19 ENCOUNTER — Other Ambulatory Visit: Payer: Self-pay | Admitting: Oncology

## 2016-01-19 ENCOUNTER — Ambulatory Visit (HOSPITAL_BASED_OUTPATIENT_CLINIC_OR_DEPARTMENT_OTHER): Payer: Medicare Other

## 2016-01-19 ENCOUNTER — Encounter: Payer: Self-pay | Admitting: Oncology

## 2016-01-19 ENCOUNTER — Telehealth: Payer: Self-pay | Admitting: Oncology

## 2016-01-19 ENCOUNTER — Other Ambulatory Visit (HOSPITAL_BASED_OUTPATIENT_CLINIC_OR_DEPARTMENT_OTHER): Payer: Medicare Other

## 2016-01-19 ENCOUNTER — Ambulatory Visit: Payer: Medicare Other | Admitting: Oncology

## 2016-01-19 VITALS — BP 130/74 | HR 96 | Temp 98.0°F | Resp 18 | Ht 67.0 in | Wt 143.2 lb

## 2016-01-19 DIAGNOSIS — C7800 Secondary malignant neoplasm of unspecified lung: Secondary | ICD-10-CM

## 2016-01-19 DIAGNOSIS — C786 Secondary malignant neoplasm of retroperitoneum and peritoneum: Secondary | ICD-10-CM | POA: Diagnosis not present

## 2016-01-19 DIAGNOSIS — D701 Agranulocytosis secondary to cancer chemotherapy: Secondary | ICD-10-CM

## 2016-01-19 DIAGNOSIS — C569 Malignant neoplasm of unspecified ovary: Secondary | ICD-10-CM

## 2016-01-19 DIAGNOSIS — C7802 Secondary malignant neoplasm of left lung: Secondary | ICD-10-CM | POA: Diagnosis not present

## 2016-01-19 DIAGNOSIS — C801 Malignant (primary) neoplasm, unspecified: Secondary | ICD-10-CM | POA: Diagnosis not present

## 2016-01-19 DIAGNOSIS — C7801 Secondary malignant neoplasm of right lung: Secondary | ICD-10-CM | POA: Diagnosis not present

## 2016-01-19 DIAGNOSIS — Z95828 Presence of other vascular implants and grafts: Secondary | ICD-10-CM

## 2016-01-19 DIAGNOSIS — E119 Type 2 diabetes mellitus without complications: Secondary | ICD-10-CM

## 2016-01-19 DIAGNOSIS — C482 Malignant neoplasm of peritoneum, unspecified: Secondary | ICD-10-CM

## 2016-01-19 DIAGNOSIS — T451X5A Adverse effect of antineoplastic and immunosuppressive drugs, initial encounter: Secondary | ICD-10-CM

## 2016-01-19 DIAGNOSIS — K5909 Other constipation: Secondary | ICD-10-CM

## 2016-01-19 DIAGNOSIS — D649 Anemia, unspecified: Secondary | ICD-10-CM

## 2016-01-19 DIAGNOSIS — D6481 Anemia due to antineoplastic chemotherapy: Secondary | ICD-10-CM

## 2016-01-19 LAB — CBC WITH DIFFERENTIAL/PLATELET
BASO%: 0.4 % (ref 0.0–2.0)
Basophils Absolute: 0 10*3/uL (ref 0.0–0.1)
EOS ABS: 0.1 10*3/uL (ref 0.0–0.5)
EOS%: 1.9 % (ref 0.0–7.0)
HEMATOCRIT: 28.6 % — AB (ref 34.8–46.6)
HEMOGLOBIN: 9.1 g/dL — AB (ref 11.6–15.9)
LYMPH%: 32.3 % (ref 14.0–49.7)
MCH: 22 pg — AB (ref 25.1–34.0)
MCHC: 31.8 g/dL (ref 31.5–36.0)
MCV: 69.1 fL — AB (ref 79.5–101.0)
MONO#: 0.4 10*3/uL (ref 0.1–0.9)
MONO%: 7.5 % (ref 0.0–14.0)
NEUT#: 3.1 10*3/uL (ref 1.5–6.5)
NEUT%: 57.9 % (ref 38.4–76.8)
Platelets: 238 10*3/uL (ref 145–400)
RBC: 4.14 10*6/uL (ref 3.70–5.45)
RDW: 16.7 % — AB (ref 11.2–14.5)
WBC: 5.4 10*3/uL (ref 3.9–10.3)
lymph#: 1.7 10*3/uL (ref 0.9–3.3)
nRBC: 0 % (ref 0–0)

## 2016-01-19 LAB — COMPREHENSIVE METABOLIC PANEL
ALBUMIN: 3.3 g/dL — AB (ref 3.5–5.0)
ALK PHOS: 86 U/L (ref 40–150)
ALT: 14 U/L (ref 0–55)
AST: 21 U/L (ref 5–34)
Anion Gap: 12 mEq/L — ABNORMAL HIGH (ref 3–11)
BUN: 18.4 mg/dL (ref 7.0–26.0)
CALCIUM: 9.8 mg/dL (ref 8.4–10.4)
CO2: 23 mEq/L (ref 22–29)
Chloride: 101 mEq/L (ref 98–109)
Creatinine: 0.9 mg/dL (ref 0.6–1.1)
EGFR: 75 mL/min/{1.73_m2} — AB (ref >90)
Glucose: 269 mg/dl — ABNORMAL HIGH (ref 70–140)
POTASSIUM: 3.9 meq/L (ref 3.5–5.1)
Sodium: 136 mEq/L (ref 136–145)
Total Bilirubin: 0.42 mg/dL (ref 0.20–1.20)
Total Protein: 6.9 g/dL (ref 6.4–8.3)

## 2016-01-19 MED ORDER — SODIUM CHLORIDE 0.9% FLUSH
10.0000 mL | INTRAVENOUS | Status: DC | PRN
  Administered 2016-01-19: 10 mL via INTRAVENOUS
  Filled 2016-01-19: qty 10

## 2016-01-19 MED ORDER — HEPARIN SOD (PORK) LOCK FLUSH 100 UNIT/ML IV SOLN
500.0000 [IU] | Freq: Once | INTRAVENOUS | Status: AC | PRN
  Administered 2016-01-19: 500 [IU] via INTRAVENOUS
  Filled 2016-01-19: qty 5

## 2016-01-19 NOTE — Progress Notes (Signed)
OFFICE PROGRESS NOTE   January 19, 2016   Physicians: P.Gehrig, Pricilla Holm, (C.Gessner) (M.Norins)   INTERVAL HISTORY:   Patient is seen, together with daughter, in continuing attention to clinical IVB gyn adenocarcinoma, clinically improving with carboplatin taxol as initial intervention. She had cycle 2 on 01-02-16 with granix 11-8 and 11-9.  Patient overall is feeling much better than at presentation, especially now out almost 3 weeks from last chemo. She no longer has any abdominal discomfort or apparent fullness. Appetite is better this week, including Boost and protein shakes. Bowels are moving almost daily with miralax once daily, tho she is uncomfortable if >1 day without BM. She is more SOB going up one full flight of stairs in the home, needs to rest part way up. She denies cough or chest pain and is not SOB with less strenuous activity. Blood sugars checked tid are occasionally >200, was 150 last pm; daughter using SS insulin per Dr Sharlet Salina. Minimal peripheral neuropathy improving out from chemo. No problems with PAC. No bleeding. No new or different pain. No HA. Sleeping well, comfortably able to position on side now. Remainder of 10 point Review of Systems negative.   Flu vaccine 10-28-15 PAC placed by IR 01-01-16 Genetics counseling scheduled 01-27-16 CA 125 on 12-05-15  469 and 739 on 12-25-15.  ONCOLOGIC HISTORY  Patient developed some mild GI symptoms in past 3-4 months, initially thought related to the diabetes medications. Symptoms of constipation, early satiety and abdominal fullness and general abdominal soreness persisted, with CT AP 11-07-15 found bibasilar pulmonary nodules and small right pleural effusion; uterus surgically absent, right adnexal solid and cystic mass5.8 x 3.7 x 4.7 cm; predominantly cystic mass with smaller soft tissue component in left adnexa6.1 x 2.8 x 7.0 cm; diffuse omental tumor infiltration up to 3 cm thick;tumor deposits are seen in  the pericolic gutters bilaterally; large tumor deposit at the cul de sac at least 4.1 cm diameter; probable tumor deposits along small bowel loops and colon in pelvis. CA 125 on 11-12-15 was 301.8, with CEA 8.3. She was seen by Dr Alycia Rossetti on 11-14-15, with omental biopsy 11-14-15 ([id number]) adenocarcinoma consistent with metastatic ovarian. Recommendation was for neoadjuvant chemotherapy with consideration of surgery by gyn oncology after ~ cycle 3 depending on response Cycle 1 carbo taxol given 12-12-15.  She was neutropenic with ANC 0.8 on day 14 cycle 1. CT chest 12-10-15 showed multiple bilateral pulmonary mets and right pleural effusion, tho not enough pleural fluid to tap.     Objective:  Vital signs in last 24 hours:  BP 130/74 (BP Location: Left Arm, Patient Position: Sitting)   Pulse 96   Temp 98 F (36.7 C) (Oral)   Resp 18   Ht '5\' 7"'$  (1.702 m)   Wt 143 lb 3.2 oz (65 kg)   SpO2 100%   BMI 22.43 kg/m  Weight stable Alert, oriented and appropriate, looks comfortable with activity in exam room, respirations not labored RA. Ambulatory without difficulty.  Alopecia  HEENT:PERRL, sclerae not icteric. Oral mucosa moist without lesions, posterior pharynx clear.  Neck supple. No JVD.  Lymphatics:no supraclavicular or inguinal adenopathy Resp: without wheezes or rales, decreased BS right lower, dullness there. Cardio: regular rate and rhythm. No gallop. GI: soft, nontender, not obviously distended and softer in upper abdomen, no mass or organomegaly. Some bowel sounds.  Musculoskeletal/ Extremities: LE without pitting edema, cords, tenderness Neuro: no significant peripheral neuropathy. Otherwise nonfocal. PSYCH appropriate mood and affect Skin without rash, ecchymosis, petechiae Portacath-without  erythema or tenderness  Lab Results:  Results for orders placed or performed in visit on 01/19/16  CBC with Differential  Result Value Ref Range   WBC 5.4 3.9 - 10.3 10e3/uL   NEUT#  3.1 1.5 - 6.5 10e3/uL   HGB 9.1 (L) 11.6 - 15.9 g/dL   HCT 28.6 (L) 34.8 - 46.6 %   Platelets 238 145 - 400 10e3/uL   MCV 69.1 (L) 79.5 - 101.0 fL   MCH 22.0 (L) 25.1 - 34.0 pg   MCHC 31.8 31.5 - 36.0 g/dL   RBC 4.14 3.70 - 5.45 10e6/uL   RDW 16.7 (H) 11.2 - 14.5 %   lymph# 1.7 0.9 - 3.3 10e3/uL   MONO# 0.4 0.1 - 0.9 10e3/uL   Eosinophils Absolute 0.1 0.0 - 0.5 10e3/uL   Basophils Absolute 0.0 0.0 - 0.1 10e3/uL   NEUT% 57.9 38.4 - 76.8 %   LYMPH% 32.3 14.0 - 49.7 %   MONO% 7.5 0.0 - 14.0 %   EOS% 1.9 0.0 - 7.0 %   BASO% 0.4 0.0 - 2.0 %   nRBC 0 0 - 0 %  Comprehensive metabolic panel  Result Value Ref Range   Sodium 136 136 - 145 mEq/L   Potassium 3.9 3.5 - 5.1 mEq/L   Chloride 101 98 - 109 mEq/L   CO2 23 22 - 29 mEq/L   Glucose 269 (H) 70 - 140 mg/dl   BUN 18.4 7.0 - 26.0 mg/dL   Creatinine 0.9 0.6 - 1.1 mg/dL   Total Bilirubin 0.42 0.20 - 1.20 mg/dL   Alkaline Phosphatase 86 40 - 150 U/L   AST 21 5 - 34 U/L   ALT 14 0 - 55 U/L   Total Protein 6.9 6.4 - 8.3 g/dL   Albumin 3.3 (L) 3.5 - 5.0 g/dL   Calcium 9.8 8.4 - 10.4 mg/dL   Anion Gap 12 (H) 3 - 11 mEq/L   EGFR 75 (L) >90 ml/min/1.73 m2  blood sugar above nonfasting, no steroids   CA 125 available after visit 421, this having been 739 on 12-25-15.   Studies/Results:  No results found.  Medications: I have reviewed the patient's current medications. Can increase miralax to bid prn RN to follow up with Hemocyte prescription, as they understand a month before this is available - will substitute if needed.  DISCUSSION We are pleased that she is clinically improving with 2 cycles of carbo taxol thus far; she will have cycle 3 on 11-24 as long as [place] >=1.5 and plt >=100k that day. We will let them know CA 125 results as above. Genetics counseling 01-27-16. Should draw labs for genetics with next scheduled draw from [facility].   Increased SOB likely from progressive anemia and/ or pulmonary involvement, with multiple  bilateral pulmonary nodules on initial scans in Sept/ Oct. If SOB progresses will need chest imaging as well as possibly PRBCs. Lung nodules have not been biopsied.  There was still some consideration of future debulking surgery when patient and daughter last met with Dr Alycia Rossetti, depending on response to chemotherapy. We have discussed considerations of interval debulking surgery, which possibly could improve disease control in abdomen/ pelvis, tho would not be curative with extensive pulmonary involvement. I have told them that continuing systemic treatment may be more beneficial than surgery, depending on response after 3 cycles, or possibly 6 cycles of chemo. Surgery, including general anesthesia , may be too rigorous for her to tolerate, again given pulmonary involvement.  Patient and daughter  may still want to discuss directly with Dr Alycia Rossetti or Dr Denman George after scans. They seem to have somewhat better understanding of situation. They are aware that Dr Alycia Rossetti does surgery at [facility];  they prefer surgery, if any, be in [place]. They are very appreciative of care and want to continue to try whatever is appropriate to improve the situation.   They are aware that another medical oncologist will be involved after first of year.  Assessment/Plan:  1. 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 Clinically improving. Will give cycle 3 on 01-23-16 as long as ANC >=1.5 and plt >=100k, with granix,  then repeat CT CAP after cycle 3. 2.Diabetes on metformin and SS insulin managed by Dr Sharlet Salina. Decadron around chemo as above, SS insulin with chemo infusion here.  3.PAC by IR 01-01-16.  4.Constipation: adjust miralax to keep bowels moving well daily 5.post hysterectomy without oophorectomy ~ 1970 6.flu vaccine  10-28-15 7.work noise exposure in past with decreased hearing 8.minimal past tobacco, DCd 1977. Second hand tobacco exposure. 9.Living WIll and HCPOA in place.  10.Social: daughter with 27 yo twins moved from [place] to be with patient. Patient's autistic son also in the home.  11..environmental allergies: better with flonase to ongoing claritin 12.anemia: hgb lower at 9.1 today, more symptomatic (vs pulmonary mets) with SOB on exertion. Add oral iron, recheck CBC on 11-24. May need PRBCs (and chest imaging)  All questions answered and they know to call if concerns. Chemo and granix orders confirmed. Communication with gyn oncology. Time spent 30 min including >50% counseling and coordination of care.    Evlyn Clines, MD   01/19/2016, 11:34 AM

## 2016-01-19 NOTE — Telephone Encounter (Signed)
Gave  Patient avs report and appointments for November and December.   Spoke with [facility] in gyn onc and patient to see Dr. Denman George 12/11 @ 11:30 pm - patient given appointment. Appointment not yet added to schedule.

## 2016-01-20 ENCOUNTER — Other Ambulatory Visit: Payer: Self-pay | Admitting: Oncology

## 2016-01-20 ENCOUNTER — Telehealth: Payer: Self-pay

## 2016-01-20 DIAGNOSIS — D6481 Anemia due to antineoplastic chemotherapy: Secondary | ICD-10-CM | POA: Insufficient documentation

## 2016-01-20 DIAGNOSIS — T451X5A Adverse effect of antineoplastic and immunosuppressive drugs, initial encounter: Secondary | ICD-10-CM | POA: Insufficient documentation

## 2016-01-20 DIAGNOSIS — C569 Malignant neoplasm of unspecified ovary: Secondary | ICD-10-CM

## 2016-01-20 DIAGNOSIS — C482 Malignant neoplasm of peritoneum, unspecified: Secondary | ICD-10-CM

## 2016-01-20 LAB — CA 125: CANCER ANTIGEN (CA) 125: 421.9 U/mL — AB (ref 0.0–38.1)

## 2016-01-20 NOTE — Telephone Encounter (Signed)
Told daughter Dorris Carnes the results of the CA-125 as noted below by Dr. Marko Plume. Daughter verbalized understanding.

## 2016-01-20 NOTE — Telephone Encounter (Signed)
-----   Message from Gordy Levan, MD sent at 01/20/2016  7:54 AM EST ----- Labs seen and need follow up please let patient or daughter know that CA 125 marker is down to 422 from 739 last check. This fits with the improvement that we can tell with her overall.   (if they ask, the previous value 469 was taken little while before chemo started, when marker was still increasing quickly. It also takes some time after chemo begins to have the marker level out then start to improve)

## 2016-01-21 ENCOUNTER — Telehealth: Payer: Self-pay

## 2016-01-21 NOTE — Telephone Encounter (Signed)
Reviewed information regarding appointments with Dr. Alycia Rossetti, CT scan and granix injection as noted below by Dr. Marko Plume. Daughter Dorris Carnes  In agreement with plan as noted below. Granix injection moved to 11-29 and 11-29.  11-30 injection cancelled.  Dtr aware of appointment dates and times.

## 2016-01-21 NOTE — Telephone Encounter (Signed)
-----   Message from Gordy Levan, MD sent at 01/21/2016  8:42 AM EST ----- Regarding: appointments Aubra Pappalardo/ Juliann Pulse:  Please let patient/ daughter know that Joylene John and I have looked at scheduling again:  We will try to get CT done shortly before I see patient in early Dec. I will talk with them about CT at my visit and communicate CT results to Dr Alycia Rossetti. We think best if Dr Alycia Rossetti sees Mrs Meinhold when she is in [place] on 12-20. From timing standpoint, likely will be best to go ahead with chemo on 12-15. We will confirm this when I see her early Dec but will keep that treatment scheduled for now.  Patient is presently scheduled for genetics 11-28, then granix on 11-29 and 11-30. To save one trip over here, could start granix a day earlier and give on 11-28 and 11-29 (cancel injection 11-30 if so)  Other information in my note if needed.  Thank you

## 2016-01-23 ENCOUNTER — Ambulatory Visit: Payer: Medicare Other

## 2016-01-23 ENCOUNTER — Other Ambulatory Visit (HOSPITAL_BASED_OUTPATIENT_CLINIC_OR_DEPARTMENT_OTHER): Payer: Medicare Other

## 2016-01-23 ENCOUNTER — Ambulatory Visit (HOSPITAL_BASED_OUTPATIENT_CLINIC_OR_DEPARTMENT_OTHER): Payer: Medicare Other

## 2016-01-23 VITALS — BP 140/72 | HR 102 | Temp 98.3°F

## 2016-01-23 DIAGNOSIS — C786 Secondary malignant neoplasm of retroperitoneum and peritoneum: Secondary | ICD-10-CM

## 2016-01-23 DIAGNOSIS — C801 Malignant (primary) neoplasm, unspecified: Secondary | ICD-10-CM

## 2016-01-23 DIAGNOSIS — E119 Type 2 diabetes mellitus without complications: Secondary | ICD-10-CM

## 2016-01-23 DIAGNOSIS — C7801 Secondary malignant neoplasm of right lung: Secondary | ICD-10-CM | POA: Diagnosis not present

## 2016-01-23 DIAGNOSIS — C7802 Secondary malignant neoplasm of left lung: Secondary | ICD-10-CM

## 2016-01-23 DIAGNOSIS — C569 Malignant neoplasm of unspecified ovary: Secondary | ICD-10-CM

## 2016-01-23 DIAGNOSIS — Z95828 Presence of other vascular implants and grafts: Secondary | ICD-10-CM

## 2016-01-23 DIAGNOSIS — Z5111 Encounter for antineoplastic chemotherapy: Secondary | ICD-10-CM | POA: Diagnosis not present

## 2016-01-23 DIAGNOSIS — C482 Malignant neoplasm of peritoneum, unspecified: Secondary | ICD-10-CM

## 2016-01-23 LAB — COMPREHENSIVE METABOLIC PANEL
ALBUMIN: 3.6 g/dL (ref 3.5–5.0)
ALK PHOS: 88 U/L (ref 40–150)
ALT: 14 U/L (ref 0–55)
AST: 22 U/L (ref 5–34)
Anion Gap: 13 mEq/L — ABNORMAL HIGH (ref 3–11)
BUN: 25.4 mg/dL (ref 7.0–26.0)
CHLORIDE: 104 meq/L (ref 98–109)
CO2: 21 mEq/L — ABNORMAL LOW (ref 22–29)
Calcium: 9.8 mg/dL (ref 8.4–10.4)
Creatinine: 0.9 mg/dL (ref 0.6–1.1)
EGFR: 76 mL/min/{1.73_m2} — AB (ref >90)
GLUCOSE: 303 mg/dL — AB (ref 70–140)
POTASSIUM: 4.2 meq/L (ref 3.5–5.1)
SODIUM: 137 meq/L (ref 136–145)
Total Bilirubin: 0.42 mg/dL (ref 0.20–1.20)
Total Protein: 7.3 g/dL (ref 6.4–8.3)

## 2016-01-23 LAB — CBC WITH DIFFERENTIAL/PLATELET
BASO%: 0.6 % (ref 0.0–2.0)
BASOS ABS: 0 10*3/uL (ref 0.0–0.1)
EOS ABS: 0 10*3/uL (ref 0.0–0.5)
EOS%: 0 % (ref 0.0–7.0)
HCT: 29.2 % — ABNORMAL LOW (ref 34.8–46.6)
HEMOGLOBIN: 9.1 g/dL — AB (ref 11.6–15.9)
LYMPH%: 7.1 % — AB (ref 14.0–49.7)
MCH: 21.5 pg — AB (ref 25.1–34.0)
MCHC: 31.3 g/dL — AB (ref 31.5–36.0)
MCV: 68.9 fL — AB (ref 79.5–101.0)
MONO#: 0 10*3/uL — ABNORMAL LOW (ref 0.1–0.9)
MONO%: 0.2 % (ref 0.0–14.0)
NEUT#: 7 10*3/uL — ABNORMAL HIGH (ref 1.5–6.5)
NEUT%: 92.1 % — AB (ref 38.4–76.8)
Platelets: 304 10*3/uL (ref 145–400)
RBC: 4.24 10*6/uL (ref 3.70–5.45)
RDW: 15.8 % — ABNORMAL HIGH (ref 11.2–14.5)
WBC: 7.6 10*3/uL (ref 3.9–10.3)
lymph#: 0.5 10*3/uL — ABNORMAL LOW (ref 0.9–3.3)

## 2016-01-23 LAB — WHOLE BLOOD GLUCOSE
GLUCOSE: 233 mg/dL — AB (ref 70–100)
HRS PC: 0.5 h

## 2016-01-23 MED ORDER — ONDANSETRON HCL 8 MG PO TABS
ORAL_TABLET | ORAL | Status: AC
  Filled 2016-01-23: qty 1

## 2016-01-23 MED ORDER — SODIUM CHLORIDE 0.9 % IV SOLN
Freq: Once | INTRAVENOUS | Status: AC
  Administered 2016-01-23: 10:00:00 via INTRAVENOUS

## 2016-01-23 MED ORDER — SODIUM CHLORIDE 0.9 % IV SOLN
454.2000 mg | Freq: Once | INTRAVENOUS | Status: AC
  Administered 2016-01-23: 450 mg via INTRAVENOUS
  Filled 2016-01-23: qty 45

## 2016-01-23 MED ORDER — ONDANSETRON HCL 8 MG PO TABS
8.0000 mg | ORAL_TABLET | Freq: Once | ORAL | Status: AC
  Administered 2016-01-23: 8 mg via ORAL

## 2016-01-23 MED ORDER — INSULIN REGULAR HUMAN 100 UNIT/ML IJ SOLN
8.0000 [IU] | Freq: Once | INTRAMUSCULAR | Status: AC | PRN
  Administered 2016-01-23: 6 [IU] via SUBCUTANEOUS
  Filled 2016-01-23: qty 0.08

## 2016-01-23 MED ORDER — DIPHENHYDRAMINE HCL 50 MG/ML IJ SOLN
25.0000 mg | Freq: Once | INTRAMUSCULAR | Status: AC
  Administered 2016-01-23: 25 mg via INTRAVENOUS

## 2016-01-23 MED ORDER — SODIUM CHLORIDE 0.9% FLUSH
10.0000 mL | INTRAVENOUS | Status: DC | PRN
  Administered 2016-01-23: 10 mL
  Filled 2016-01-23: qty 10

## 2016-01-23 MED ORDER — SODIUM CHLORIDE 0.9 % IV SOLN
Freq: Once | INTRAVENOUS | Status: AC
  Administered 2016-01-23: 11:00:00 via INTRAVENOUS
  Filled 2016-01-23: qty 5

## 2016-01-23 MED ORDER — FAMOTIDINE IN NACL 20-0.9 MG/50ML-% IV SOLN
INTRAVENOUS | Status: AC
  Filled 2016-01-23: qty 50

## 2016-01-23 MED ORDER — INSULIN REGULAR HUMAN 100 UNIT/ML IJ SOLN
4.0000 [IU] | Freq: Once | INTRAMUSCULAR | Status: AC
  Administered 2016-01-23: 4 [IU] via SUBCUTANEOUS
  Filled 2016-01-23: qty 0.04

## 2016-01-23 MED ORDER — SODIUM CHLORIDE 0.9% FLUSH
10.0000 mL | INTRAVENOUS | Status: DC | PRN
  Administered 2016-01-23: 10 mL via INTRAVENOUS
  Filled 2016-01-23: qty 10

## 2016-01-23 MED ORDER — PACLITAXEL CHEMO INJECTION 300 MG/50ML
175.0000 mg/m2 | Freq: Once | INTRAVENOUS | Status: AC
  Administered 2016-01-23: 318 mg via INTRAVENOUS
  Filled 2016-01-23: qty 53

## 2016-01-23 MED ORDER — HEPARIN SOD (PORK) LOCK FLUSH 100 UNIT/ML IV SOLN
500.0000 [IU] | Freq: Once | INTRAVENOUS | Status: AC | PRN
  Administered 2016-01-23: 500 [IU]
  Filled 2016-01-23: qty 5

## 2016-01-23 MED ORDER — FAMOTIDINE IN NACL 20-0.9 MG/50ML-% IV SOLN
20.0000 mg | Freq: Once | INTRAVENOUS | Status: AC
  Administered 2016-01-23: 20 mg via INTRAVENOUS

## 2016-01-23 MED ORDER — DIPHENHYDRAMINE HCL 50 MG/ML IJ SOLN
INTRAMUSCULAR | Status: AC
  Filled 2016-01-23: qty 1

## 2016-01-23 NOTE — Patient Instructions (Signed)
[facility] Discharge Instructions for Patients Receiving Chemotherapy  Today you received the following chemotherapy agents: Taxol and Carboplatin.  To help prevent nausea and vomiting after your treatment, we encourage you to take your nausea medication: Zofran. Take one every 8 hours as needed.   If you develop nausea and vomiting that is not controlled by your nausea medication, call the clinic.   BELOW ARE SYMPTOMS THAT SHOULD BE REPORTED IMMEDIATELY:  *FEVER GREATER THAN 100.5 F  *CHILLS WITH OR WITHOUT FEVER  NAUSEA AND VOMITING THAT IS NOT CONTROLLED WITH YOUR NAUSEA MEDICATION  *UNUSUAL SHORTNESS OF BREATH  *UNUSUAL BRUISING OR BLEEDING  TENDERNESS IN MOUTH AND THROAT WITH OR WITHOUT PRESENCE OF ULCERS  *URINARY PROBLEMS  *BOWEL PROBLEMS  UNUSUAL RASH Items with * indicate a potential emergency and should be followed up as soon as possible.  Feel free to call the clinic should you have any questions or concerns. The clinic phone number is [phone].  Please show the CHEMO ALERT CARD at check-in to the Emergency Department and triage nurse.   

## 2016-01-24 ENCOUNTER — Other Ambulatory Visit: Payer: Self-pay | Admitting: Internal Medicine

## 2016-01-27 ENCOUNTER — Ambulatory Visit: Payer: Medicare Other | Admitting: Genetic Counselor

## 2016-01-27 ENCOUNTER — Ambulatory Visit: Payer: Medicare Other

## 2016-01-27 ENCOUNTER — Other Ambulatory Visit: Payer: Medicare Other

## 2016-01-27 ENCOUNTER — Ambulatory Visit (HOSPITAL_BASED_OUTPATIENT_CLINIC_OR_DEPARTMENT_OTHER): Payer: Medicare Other

## 2016-01-27 ENCOUNTER — Encounter: Payer: Self-pay | Admitting: Genetic Counselor

## 2016-01-27 VITALS — BP 122/77 | HR 99 | Temp 98.2°F

## 2016-01-27 DIAGNOSIS — Z803 Family history of malignant neoplasm of breast: Secondary | ICD-10-CM

## 2016-01-27 DIAGNOSIS — C801 Malignant (primary) neoplasm, unspecified: Principal | ICD-10-CM

## 2016-01-27 DIAGNOSIS — C786 Secondary malignant neoplasm of retroperitoneum and peritoneum: Secondary | ICD-10-CM

## 2016-01-27 DIAGNOSIS — C7801 Secondary malignant neoplasm of right lung: Secondary | ICD-10-CM | POA: Diagnosis not present

## 2016-01-27 DIAGNOSIS — C7802 Secondary malignant neoplasm of left lung: Secondary | ICD-10-CM | POA: Diagnosis not present

## 2016-01-27 DIAGNOSIS — Z95828 Presence of other vascular implants and grafts: Secondary | ICD-10-CM

## 2016-01-27 DIAGNOSIS — Z809 Family history of malignant neoplasm, unspecified: Secondary | ICD-10-CM

## 2016-01-27 DIAGNOSIS — Z8042 Family history of malignant neoplasm of prostate: Secondary | ICD-10-CM

## 2016-01-27 DIAGNOSIS — Z5189 Encounter for other specified aftercare: Secondary | ICD-10-CM

## 2016-01-27 MED ORDER — TBO-FILGRASTIM 300 MCG/0.5ML ~~LOC~~ SOSY
300.0000 ug | PREFILLED_SYRINGE | Freq: Once | SUBCUTANEOUS | Status: AC
  Administered 2016-01-27: 300 ug via SUBCUTANEOUS
  Filled 2016-01-27: qty 0.5

## 2016-01-27 NOTE — Progress Notes (Signed)
REFERRING PROVIDER: Evlyn Clines, MD  PRIMARY PROVIDER:  Hoyt Koch, MD  PRIMARY REASON FOR VISIT:  1. Peritoneal carcinomatosis ([place])   2. Family history of breast cancer in sister   24. Family history of prostate cancer   4. Family history of cancer      HISTORY OF PRESENT ILLNESS:   Leslie Rowe, a 80 y.o. female, was seen for a [place] cancer genetics consultation at the request of Dr. Marko Plume due to a personal history of peritoneal carcinoma and family history of breast, prostate, and other cancers.  Leslie Rowe presents to clinic today with her daughter to discuss the possibility of a hereditary predisposition to cancer, genetic testing, and to further clarify her future cancer risks, as well as potential cancer risks for family members.   In September 2017, at the age of 35, Leslie Rowe was diagnosed with adenocarcinoma of the peritoneal cavity. This is currently being treated with chemotherapy.   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        HORMONAL RISK FACTORS:  Menarche was at age 61-15.  First live birth at age 42.  OCP use for approximately 1 to 1.5 years.  Ovaries intact: yes.  Hysterectomy: no, had hysterectomy in 1971 for fibroids.  Menopausal status: postmenopausal.  HRT use: 0 years. Colonoscopy: yes; most recent colonoscopy was in 2008 - severe diverticulosis found, no polyps; one previous colonoscopy also reportedly found no polyps. Mammogram within the last year: yes. Number of breast biopsies: 0. Up to date with pelvic exams:  n/a. Any excessive radiation exposure/other exposures in the past:  Reports some history of secondhand smoke exposure but remotely  Past Medical History:  Diagnosis Date  . Diabetes mellitus    type II, mild  . Hyperlipidemia   . Hypertension    borderline    Past Surgical History:   Procedure Laterality Date  . ABDOMINAL HYSTERECTOMY     total  . IR GENERIC HISTORICAL  01/01/2016   IR US GUIDE VASC ACCESS RIGHT 01/01/2016 Greggory Keen, MD [facility]  . IR GENERIC HISTORICAL  01/01/2016   IR FLUORO GUIDE PORT INSERTION RIGHT 01/01/2016 Greggory Keen, MD [facility]    Social History   Social History  . Marital status: Divorced    Spouse name: N/A  . Number of children: 2  . Years of education: 12   Occupational History  . retired [employer]   Social History Main Topics  . Smoking status: Former Smoker    Packs/day: 0.25    Years: 7.00    Types: Cigarettes    Quit date: 03/01/1978  . Smokeless tobacco: Never Used     Comment: smoked 3-5 cigs per day for 7-8 yrs  . Alcohol use No  . Drug use: No  . Sexual activity: Not Currently   Other Topics Concern  . None   Social History Narrative   HSG. Married '60 - 55yr/divorced. Married '80's - 332yrs/divorced. 1 dtr - '70, 1 son - '64. 2 grandchildren. Retired - [employer]. Lives in own home, son is autistic and lives with her. Staying busy.               FAMILY HISTORY:  We obtained a detailed, 4-generation family history.  Significant diagnoses are listed below: Family History  Problem Relation Age of Onset  . Hypertension Mother   .  Other Mother     CVa  . Stroke Mother   . Cancer Brother     NOS cancer w/ metastasis; d. 34s  . Other Son     Autistic  . Hypertension Father   . Stroke Father   . Hypertension Sister   . Breast cancer Sister 55  . Ovarian cysts Sister     TAH-BSO for benign ovarian cyst  . Seizures Brother     d. 15  . Diabetes Brother   . Prostate cancer Brother     dx "awhile ago"  . Cancer Cousin     maternal 1st cousin, once-removed dx NOS cancer in his late 36s-early 60s    Leslie Rowe has one son and one daughter, ages 5 and 41.  Her children have never had cancer.  Her son is on the autism spectrum and he lives with her.  Leslie Rowe's daughter has 77-year-old  twins (one son and one daughter).  Leslie Rowe has one full sister and three full brothers.  Two of her brothers have passed away.  One brother passed away in his sleep at the age of 50.  Leslie Rowe attributes this to a seizure.  The other brother was diagnosed with an unspecified type of cancer and passed away from metastasis in his 52s.  Leslie Rowe's sister is currently 40 and was diagnosed with breast cancer earlier this year.  She had a TAH-BSO recently to address a benign cyst on her ovary.  The other brother is currently 62 and was diagnosed with prostate cancer "awhile ago".  Leslie Rowe reports no known history of cancer for any of her nieces or nephews.    Leslie Rowe's mother died of a stroke and high blood pressure at age 58-58.  Leslie Rowe's mother had approximately 12 siblings, for whom Leslie Rowe has very limited information.  She reports that many of these siblings had passed away before she was born, but that they were also all older than her mother.  One sister had a grandson who was diagnosed with some unspecified type of cancer in his late 37s-60s.  Ms. Bunch's maternal grandmother passed away when her mother was a baby; Ms. Ebersole has no further information for her.  Her maternal grandfather died in his 12s.  She has no further information for any maternal great aunts/uncles or great grandparents.  Ms. Schroader's father died from an unspecified cause in his 105s.  Her parents were divorced, so she has limited information regarding his health.  Leslie Rowe's father had three full sisters and 8-9 paternal half-siblings.  Leslie Rowe has limited information for many of these relatives and for their children, but she has not heard of any cancer history.  Leslie Rowe's paternal grandmother died at an "early age", but she is not sure of the cause.  Her grandfather passed away at an older age from an unspecified cause.  She has no information for any paternal great aunts/uncles and great grandparents.  Leslie Rowe is unaware of any family history  of genetic testing for hereditary cancer risks.  Patient's maternal and paternal ancestors are of African American descent. There is no reported Ashkenazi Jewish ancestry. There is no known consanguinity.  GENETIC COUNSELING ASSESSMENT: Leslie Rowe is a 80 y.o. female with a personal and family history of cancer which somewhat suggestive of a hereditary cancer syndrome and predisposition to cancer. We, therefore, discussed and recommended the following at today's visit.   DISCUSSION: We reviewed the characteristics,  features and inheritance patterns of hereditary cancer syndromes, particularly those caused by mutations within the BRCA1/2 and Lynch syndrome genes. We also discussed genetic testing, including the appropriate family members to test, the process of testing, insurance coverage and turn-around-time for results. We discussed the implications of a negative, positive and/or variant of uncertain significant result. We recommended Ms. Pund pursue genetic testing for the 28-gene [facility] Hereditary Cancer Panel through [facility] ([place]).  The [facility] Hereditary Cancer Panel offered by [facility] includes sequencing and/or deletion/duplication testing of the following 28 genes: APC, ATM, BARD1, BMPR1A, BRCA1, BRCA2, BRIP1, CHD1, CDK4, CDKN2A, CHEK2, EPCAM (large rearrangement only), GREM1/SCG5, MLH1, MSH2, MSH6, MUTYH, NBN, PALB2, PMS2, POLD1, POLE, PTEN, RAD51C, RAD51D, SMAD4, STK11, and TP53.  We also discussed tumor testing via homologous recombination deficiency (HRD) testing to provide [place] with additional helpful information regarding cancer treatment options, like PARP inhibitors.  Based on Ms. Saric's personal and family history of cancer, she meets medical criteria for genetic testing. Despite that she meets criteria, she may still have an out of pocket cost. We discussed that if her out of pocket cost for testing is over $100, the laboratory will call  and confirm whether she wants to proceed with testing.  If the out of pocket cost of testing is less than $100 she will be billed by the genetic testing laboratory.   PLAN: After considering the risks, benefits, and limitations, Ms. Mathurin  provided informed consent to pursue genetic testing and the blood sample was sent to [facility] for analysis of the 28-gene [facility] Hereditary Cancer Panel.  If the tumor sample obtained with biopsy is sufficient for analysis, it will be sent to [facility] for HRD testing.  Results from the 28-gene [facility] Hereditary Cancer Panel should be available within approximately 2-3 weeks' time, at which point they will be disclosed by telephone to Ms. Lessley, as will any additional recommendations warranted by these results. Ms. Bayless will receive a summary of her genetic counseling visit and a copy of her results once available. This information will also be available in Epic. We encouraged Ms. Herman to remain in contact with cancer genetics annually so that we can continuously update the family history and inform her of any changes in cancer genetics and testing that may be of benefit for her family. Ms. Mcquitty questions were answered to her satisfaction today. Our contact information was provided should additional questions or concerns arise.  Thank you for the referral and allowing [place] to share in the care of your patient.   Jeanine Luz, MS, [facility] Certified Genetic Counselor [email or web address] Phone: [phone]  The patient was seen for a total of 60 minutes in face-to-face genetic counseling.  This patient was discussed with Drs. Magrinat, Lindi Adie and/or Burr Medico who agrees with the above.    _______________________________________________________________________ For Office Staff:  Number of people involved in session: 2 Was an Intern/ student involved with case: no

## 2016-01-27 NOTE — Patient Instructions (Signed)

## 2016-01-28 ENCOUNTER — Ambulatory Visit (HOSPITAL_BASED_OUTPATIENT_CLINIC_OR_DEPARTMENT_OTHER): Payer: Medicare Other

## 2016-01-28 VITALS — BP 111/68 | HR 101 | Temp 97.7°F | Resp 20

## 2016-01-28 DIAGNOSIS — C801 Malignant (primary) neoplasm, unspecified: Secondary | ICD-10-CM

## 2016-01-28 DIAGNOSIS — C786 Secondary malignant neoplasm of retroperitoneum and peritoneum: Secondary | ICD-10-CM

## 2016-01-28 DIAGNOSIS — C7801 Secondary malignant neoplasm of right lung: Secondary | ICD-10-CM

## 2016-01-28 DIAGNOSIS — C7802 Secondary malignant neoplasm of left lung: Secondary | ICD-10-CM

## 2016-01-28 DIAGNOSIS — Z5189 Encounter for other specified aftercare: Secondary | ICD-10-CM

## 2016-01-28 DIAGNOSIS — Z95828 Presence of other vascular implants and grafts: Secondary | ICD-10-CM

## 2016-01-28 MED ORDER — TBO-FILGRASTIM 300 MCG/0.5ML ~~LOC~~ SOSY
300.0000 ug | PREFILLED_SYRINGE | Freq: Once | SUBCUTANEOUS | Status: AC
  Administered 2016-01-28: 300 ug via SUBCUTANEOUS
  Filled 2016-01-28: qty 0.5

## 2016-01-28 NOTE — Patient Instructions (Signed)

## 2016-01-29 ENCOUNTER — Ambulatory Visit: Payer: Medicare Other

## 2016-02-01 ENCOUNTER — Other Ambulatory Visit: Payer: Self-pay | Admitting: Oncology

## 2016-02-01 DIAGNOSIS — C482 Malignant neoplasm of peritoneum, unspecified: Secondary | ICD-10-CM

## 2016-02-02 ENCOUNTER — Telehealth: Payer: Self-pay

## 2016-02-02 NOTE — Telephone Encounter (Signed)
-----   Message from Gordy Levan, MD sent at 02/01/2016  7:13 PM EST ----- Regarding: CT before 12-7 Could you please check on CT CAP, which I had hoped to get shortly prior to her visit on 12-7, but is not yet scheduled in EMR-  thanks

## 2016-02-02 NOTE — Telephone Encounter (Signed)
S/w pt she received message and has no questions.

## 2016-02-02 NOTE — Telephone Encounter (Signed)
lvm CT CAP scheduled for 12/6 at 115 pm. Get contrast today or tomorrow. NPO 4 hours before test. Drink contrast at 1130 and 1230.  Requested she call back to confirm she received this message.

## 2016-02-04 ENCOUNTER — Ambulatory Visit (HOSPITAL_COMMUNITY)
Admission: RE | Admit: 2016-02-04 | Discharge: 2016-02-04 | Disposition: A | Discharge status: Home / Self Care | Payer: Medicare Other | Source: Ambulatory Visit | Attending: Oncology | Admitting: Oncology

## 2016-02-04 DIAGNOSIS — N9489 Other specified conditions associated with female genital organs and menstrual cycle: Secondary | ICD-10-CM | POA: Diagnosis not present

## 2016-02-04 DIAGNOSIS — C482 Malignant neoplasm of peritoneum, unspecified: Secondary | ICD-10-CM | POA: Diagnosis present

## 2016-02-04 DIAGNOSIS — I700 Atherosclerosis of aorta: Secondary | ICD-10-CM | POA: Diagnosis not present

## 2016-02-04 DIAGNOSIS — J90 Pleural effusion, not elsewhere classified: Secondary | ICD-10-CM | POA: Diagnosis not present

## 2016-02-04 DIAGNOSIS — C7800 Secondary malignant neoplasm of unspecified lung: Secondary | ICD-10-CM | POA: Insufficient documentation

## 2016-02-04 DIAGNOSIS — K869 Disease of pancreas, unspecified: Secondary | ICD-10-CM | POA: Insufficient documentation

## 2016-02-04 DIAGNOSIS — C786 Secondary malignant neoplasm of retroperitoneum and peritoneum: Secondary | ICD-10-CM | POA: Insufficient documentation

## 2016-02-04 DIAGNOSIS — R935 Abnormal findings on diagnostic imaging of other abdominal regions, including retroperitoneum: Secondary | ICD-10-CM | POA: Insufficient documentation

## 2016-02-04 DIAGNOSIS — C569 Malignant neoplasm of unspecified ovary: Secondary | ICD-10-CM | POA: Diagnosis present

## 2016-02-04 MED ORDER — IOPAMIDOL (ISOVUE-300) INJECTION 61%
100.0000 mL | Freq: Once | INTRAVENOUS | Status: AC | PRN
  Administered 2016-02-04: 100 mL via INTRAVENOUS

## 2016-02-05 ENCOUNTER — Other Ambulatory Visit (HOSPITAL_BASED_OUTPATIENT_CLINIC_OR_DEPARTMENT_OTHER): Payer: Medicare Other

## 2016-02-05 ENCOUNTER — Ambulatory Visit (HOSPITAL_BASED_OUTPATIENT_CLINIC_OR_DEPARTMENT_OTHER): Payer: Medicare Other | Admitting: Oncology

## 2016-02-05 ENCOUNTER — Other Ambulatory Visit: Payer: Self-pay

## 2016-02-05 ENCOUNTER — Encounter: Payer: Self-pay | Admitting: Oncology

## 2016-02-05 ENCOUNTER — Ambulatory Visit (HOSPITAL_BASED_OUTPATIENT_CLINIC_OR_DEPARTMENT_OTHER): Payer: Medicare Other

## 2016-02-05 VITALS — BP 128/75 | HR 75 | Temp 97.9°F | Resp 18 | Wt 140.6 lb

## 2016-02-05 DIAGNOSIS — C7801 Secondary malignant neoplasm of right lung: Secondary | ICD-10-CM

## 2016-02-05 DIAGNOSIS — C801 Malignant (primary) neoplasm, unspecified: Secondary | ICD-10-CM

## 2016-02-05 DIAGNOSIS — E119 Type 2 diabetes mellitus without complications: Secondary | ICD-10-CM

## 2016-02-05 DIAGNOSIS — C7802 Secondary malignant neoplasm of left lung: Secondary | ICD-10-CM

## 2016-02-05 DIAGNOSIS — C7800 Secondary malignant neoplasm of unspecified lung: Secondary | ICD-10-CM

## 2016-02-05 DIAGNOSIS — C482 Malignant neoplasm of peritoneum, unspecified: Secondary | ICD-10-CM

## 2016-02-05 DIAGNOSIS — C786 Secondary malignant neoplasm of retroperitoneum and peritoneum: Secondary | ICD-10-CM

## 2016-02-05 DIAGNOSIS — Z95828 Presence of other vascular implants and grafts: Secondary | ICD-10-CM

## 2016-02-05 DIAGNOSIS — T451X5A Adverse effect of antineoplastic and immunosuppressive drugs, initial encounter: Secondary | ICD-10-CM

## 2016-02-05 DIAGNOSIS — D701 Agranulocytosis secondary to cancer chemotherapy: Secondary | ICD-10-CM

## 2016-02-05 DIAGNOSIS — C569 Malignant neoplasm of unspecified ovary: Secondary | ICD-10-CM

## 2016-02-05 DIAGNOSIS — D649 Anemia, unspecified: Secondary | ICD-10-CM

## 2016-02-05 LAB — CBC WITH DIFFERENTIAL/PLATELET
BASO%: 0.3 % (ref 0.0–2.0)
BASOS ABS: 0 10*3/uL (ref 0.0–0.1)
EOS%: 1 % (ref 0.0–7.0)
Eosinophils Absolute: 0.1 10*3/uL (ref 0.0–0.5)
HEMATOCRIT: 29 % — AB (ref 34.8–46.6)
HEMOGLOBIN: 9.2 g/dL — AB (ref 11.6–15.9)
LYMPH#: 1.5 10*3/uL (ref 0.9–3.3)
LYMPH%: 23.8 % (ref 14.0–49.7)
MCH: 22.3 pg — AB (ref 25.1–34.0)
MCHC: 31.9 g/dL (ref 31.5–36.0)
MCV: 70.1 fL — ABNORMAL LOW (ref 79.5–101.0)
MONO#: 0.5 10*3/uL (ref 0.1–0.9)
MONO%: 8.2 % (ref 0.0–14.0)
NEUT%: 66.7 % (ref 38.4–76.8)
NEUTROS ABS: 4.1 10*3/uL (ref 1.5–6.5)
Platelets: 270 10*3/uL (ref 145–400)
RBC: 4.13 10*6/uL (ref 3.70–5.45)
RDW: 17.3 % — AB (ref 11.2–14.5)
WBC: 6.1 10*3/uL (ref 3.9–10.3)

## 2016-02-05 LAB — COMPREHENSIVE METABOLIC PANEL
ALBUMIN: 3.6 g/dL (ref 3.5–5.0)
ALK PHOS: 89 U/L (ref 40–150)
ALT: 14 U/L (ref 0–55)
AST: 20 U/L (ref 5–34)
Anion Gap: 11 mEq/L (ref 3–11)
BILIRUBIN TOTAL: 0.22 mg/dL (ref 0.20–1.20)
BUN: 17.2 mg/dL (ref 7.0–26.0)
CALCIUM: 9.9 mg/dL (ref 8.4–10.4)
CO2: 26 mEq/L (ref 22–29)
CREATININE: 0.8 mg/dL (ref 0.6–1.1)
Chloride: 102 mEq/L (ref 98–109)
EGFR: 79 mL/min/{1.73_m2} — ABNORMAL LOW (ref >90)
Glucose: 244 mg/dl — ABNORMAL HIGH (ref 70–140)
Potassium: 3.8 mEq/L (ref 3.5–5.1)
Sodium: 139 mEq/L (ref 136–145)
TOTAL PROTEIN: 7.1 g/dL (ref 6.4–8.3)

## 2016-02-05 MED ORDER — LORAZEPAM 0.5 MG PO TABS: ORAL_TABLET | ORAL | 0 refills | Status: DC

## 2016-02-05 MED ORDER — DEXAMETHASONE 4 MG PO TABS: ORAL_TABLET | ORAL | 0 refills | Status: DC

## 2016-02-05 MED ORDER — SODIUM CHLORIDE 0.9% FLUSH
10.0000 mL | INTRAVENOUS | Status: DC | PRN
  Administered 2016-02-05: 10 mL via INTRAVENOUS
  Filled 2016-02-05: qty 10

## 2016-02-05 MED ORDER — HEPARIN SOD (PORK) LOCK FLUSH 100 UNIT/ML IV SOLN
500.0000 [IU] | Freq: Once | INTRAVENOUS | Status: AC | PRN
  Administered 2016-02-05: 500 [IU] via INTRAVENOUS
  Filled 2016-02-05: qty 5

## 2016-02-05 NOTE — Progress Notes (Signed)
OFFICE PROGRESS NOTE   February 05, 2016   Physicians:  P.Gehrig, Pricilla Holm, (C.Gessner) (M.Norins)   INTERVAL HISTORY:   Patient is seen, together with daughter, in continuing attention to IVB gyn adenocarcinoma, which is improving with carboplatin taxol as initial intervention. She had cycle 3 on 01-23-16 with granix, and restaging CT CAP on 02-04-16, which shows improvement in most areas (compared with CT AP a month prior to first chemo). She is to see Dr Alycia Rossetti on 02-18-16.   Patient has felt very well last few days, able to cook and to do laundry, much more energetic. Appetite has been much better in past week. She denies abdominal or pelvic discomfort. She has some SOB with exertion at times, no cough and no chest pain. She is sleeping well. No peripheral neuropathy. No problems with PAC. No bleeding. No LE swelling. No fever or symptoms of infection. Diarrhea after oral contrast for CTs, may do better with next scans using water based contrast at radiology; bowels prior to contrast were moving well daily with miralax. Bladder ok. No bleeding. Remainder of 10 point Review of Systems negative.     Flu vaccine 10-28-15 PAC placed by IR 01-01-16 Genetics counseling scheduled 01-27-16 CA 125 on 12-05-15  469 and 739 on 12-25-15.  ONCOLOGIC HISTORY Patient developed some mild GI symptoms in past 3-4 months, initially thought related to the diabetes medications. Symptoms of constipation, early satiety and abdominal fullness and general abdominal soreness persisted, with CT AP 11-07-15 found bibasilar pulmonary nodules and small right pleural effusion; uterus surgically absent, right adnexal solid and cystic mass5.8 x 3.7 x 4.7 cm; predominantly cystic mass with smaller soft tissue component in left adnexa6.1 x 2.8 x 7.0 cm; diffuse omental tumor infiltration up to 3 cm thick;tumor deposits are seen in the pericolic gutters bilaterally; large tumor deposit at the cul de sac at least  4.1 cm diameter; probable tumor deposits along small bowel loops and colon in pelvis. CA 125 on 11-12-15 was 301.8, with CEA 8.3. She was seen by Dr Alycia Rossetti on 11-14-15, with omental biopsy 11-14-15 ([id number]) adenocarcinoma consistent with metastatic ovarian. Recommendation was for neoadjuvant chemotherapy with consideration of surgery by gyn oncology after ~ cycle 3 depending on response Cycle 1 carbo taxol given 12-12-15. She was neutropenic with ANC 0.8 on day 14 cycle 1. CT chest 12-10-15 showed multiple bilateral pulmonary mets and right pleural effusion, tho not enough pleural fluid to tap.  CT CAP 02-04-16 after 3 cycles chemo, compared with CT AP done a month prior to first chemo, shows some improvement multiple pulmonary mets with decrease in low left cervical/ supraclavicular adenopathy and decrease in small right pleural effusion, improved omental and peritoneal mets, stable right adnexal mass and increase in left adnexal cystic lesion.       Objective:  Vital signs in last 24 hours:  BP 128/75 (BP Location: Left Arm, Patient Position: Sitting)   Pulse 75   Temp 97.9 F (36.6 C) (Oral)   Resp 18   Wt 140 lb 9.6 oz (63.8 kg)   SpO2 98%   BMI 22.02 kg/m  Weight down 2.5 lbs. Alert, oriented and appropriate, looks comfortable, respirations not labored RA. Ambulatory without difficulty, easily able to get on and off exam table.  Alopecia  HEENT:PERRL, sclerae not icteric. Oral mucosa moist without lesions, posterior pharynx clear.  Neck supple. No JVD.  Lymphatics: slight fullness left low cervical/ medial supraclavicular Resp: clear to auscultation bilaterally and minimal dullness just right base  Cardio: regular rate and rhythm. No gallop. GI: soft, nontender, not obviously distended, no mass or organomegaly. Normally active bowel sounds.  Musculoskeletal/ Extremities: LE without pitting edema, cords, tenderness Neuro: no peripheral neuropathy. Otherwise nonfocal. PSYCH  appropriate mood and affect Skin without rash, ecchymosis, petechiae Portacath-without erythema or tenderness  Lab Results:  Results for orders placed or performed in visit on 02/05/16  CBC with Differential  Result Value Ref Range   WBC 6.1 3.9 - 10.3 10e3/uL   NEUT# 4.1 1.5 - 6.5 10e3/uL   HGB 9.2 (L) 11.6 - 15.9 g/dL   HCT 29.0 (L) 34.8 - 46.6 %   Platelets 270 145 - 400 10e3/uL   MCV 70.1 (L) 79.5 - 101.0 fL   MCH 22.3 (L) 25.1 - 34.0 pg   MCHC 31.9 31.5 - 36.0 g/dL   RBC 4.13 3.70 - 5.45 10e6/uL   RDW 17.3 (H) 11.2 - 14.5 %   lymph# 1.5 0.9 - 3.3 10e3/uL   MONO# 0.5 0.1 - 0.9 10e3/uL   Eosinophils Absolute 0.1 0.0 - 0.5 10e3/uL   Basophils Absolute 0.0 0.0 - 0.1 10e3/uL   NEUT% 66.7 38.4 - 76.8 %   LYMPH% 23.8 14.0 - 49.7 %   MONO% 8.2 0.0 - 14.0 %   EOS% 1.0 0.0 - 7.0 %   BASO% 0.3 0.0 - 2.0 %  Comprehensive metabolic panel  Result Value Ref Range   Sodium 139 136 - 145 mEq/L   Potassium 3.8 3.5 - 5.1 mEq/L   Chloride 102 98 - 109 mEq/L   CO2 26 22 - 29 mEq/L   Glucose 244 (H) 70 - 140 mg/dl   BUN 17.2 7.0 - 26.0 mg/dL   Creatinine 0.8 0.6 - 1.1 mg/dL   Total Bilirubin 0.22 0.20 - 1.20 mg/dL   Alkaline Phosphatase 89 40 - 150 U/L   AST 20 5 - 34 U/L   ALT 14 0 - 55 U/L   Total Protein 7.1 6.4 - 8.3 g/dL   Albumin 3.6 3.5 - 5.0 g/dL   Calcium 9.9 8.4 - 10.4 mg/dL   Anion Gap 11 3 - 11 mEq/L   EGFR 79 (L) >90 ml/min/1.73 m2   CA 125 available after visit down to 307, this having been 421 on 11-20 and 740 on 12-25-15.  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 Status post chemotherapy. 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 Lung metastasis. EXAM: CT CHEST, ABDOMEN, AND PELVIS WITH CONTRAST TECHNIQUE: Multidetector CT imaging of the chest, abdomen and pelvis was performed following the standard protocol during bolus administration of  intravenous contrast. CONTRAST:  115m ISOVUE-300 IOPAMIDOL (ISOVUE-300) INJECTION 61% COMPARISON:  Chest CT of 12/09/2015. Abdominal pelvic CT of 11/07/2015. FINDINGS: CT CHEST FINDINGS Cardiovascular: Right sided Port-A-Cath which terminates the superior caval/ atrial junction. Aortic and branch vessel atherosclerosis. Tortuous thoracic aorta. Normal heart size, without pericardial effusion. No central pulmonary embolism, on this non-dedicated study. Mediastinum/Nodes: Left supraclavicular nodes measure maximally 7 mm on image 4/series 2 versus 10 mm on the prior exam (when remeasured). No mediastinal adenopathy. Right hilar nodal tissue is unchanged at 11 mm on image 24/ series 2. Not pathologic by size criteria. Dilated thoracic duct. Lungs/Pleura: Decrease in right pleural fluid, small. Bilateral pulmonary nodules, with interval development of cavitation. A newly cavitary 11 x 10 mm right apical nodule on image 25/ series 7 measured 12 x 10 mm previously. A more  inferior right upper lobe 10 mm nodule on image 34/ series 7 is newly cavitary but similar in size. Superior segment left lower lobe index nodule measures 5 mm on image 40/series 7 versus 7 mm on the prior exam (when remeasured). Musculoskeletal: No acute osseous abnormality. CT ABDOMEN PELVIS FINDINGS Hepatobiliary: Suspect a right hepatic lobe 10 mm hemangioma on image 44/series 2. Vague hypo attenuating right hepatic lobe 9 mm lesion on image 62/ series 2 is unchanged and indeterminate. Normal gallbladder, without biliary ductal dilatation. Pancreas: Hypoattenuation involving the pancreatic body/ tail junction is ill-defined and results in upstream duct dilatation. Difficult to quantify secondary to ill-defined morphology. Measures on the order of 2.6 x 2.5 cm on image 55/series 2. Slightly more conspicuous today than at 1.9 x 0.8 cm on the prior. No acute pancreatitis. Spleen: Normal in size, without focal abnormality. Adrenals/Urinary Tract: Normal  adrenal glands. Normal kidneys, without hydronephrosis. Normal urinary bladder. Stomach/Bowel: Normal stomach, without wall thickening. Colonic stool burden suggests constipation. Normal terminal ileum. Normal small bowel. Vascular/Lymphatic: Aortic and branch vessel atherosclerosis. No abdominal adenopathy. Suspect an enlarged right common iliac node at 11 mm on image 82/series 2. Compare 12 mm on image 46/series 2 of the prior exam (when remeasured). Reproductive: Hysterectomy. Septated cystic Lesion in the left hemipelvis measures 7.6 x 4.4 cm today versus 6.1 x 2.8 cm on the prior. Enlarged right ovary with solid and cystic components measures 5.5 x 3.5 cm today versus 5.8 x 3.7 cm on the prior. This is felt to be similar. Other: No significant free fluid. Persistent omental/peritoneal nodularity. A right-sided omental mass measures 2.2 x 3.6 cm on image 76/series 2. Compare 4.8 x 2.3 cm on the prior exam (when remeasured). Inferior omental caking within the left hemipelvis measures 1.8 cm on image 89/ series 2 versus 2.9 cm at the same level on the prior. No ascites. Musculoskeletal: Degenerative disc disease at the lumbosacral junction. IMPRESSION: CT CHEST IMPRESSION 1. Slight improvement in pulmonary metastasis. 2. Decrease in low left cervical/supraclavicular adenopathy, favoring response to therapy. 3. Decrease in small right pleural effusion. CT ABDOMEN AND PELVIS IMPRESSION 1. Improvement in omental/peritoneal metastasis. 2. Enlarged cystic lesion within the left hemipelvis could represent a primary ovarian malignancy or metastasis. Right ovarian soft tissue fullness and heterogeneity is not significantly changed. 3. Pancreatic body/tail junction hypoattenuation is difficult to quantify secondary to morphology. However, felt to be increased in conspicuity today. Considerations include metastatic disease or a somewhat atypical distribution of pancreatic adenocarcinoma with metastasis. 4. Slight  improvement in right common iliac high adenopathy. 5. Aortic atherosclerosis. Electronically Signed   By: Abigail Miyamoto M.D.   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 Status post chemotherapy. 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 Lung metastasis. EXAM: CT CHEST, ABDOMEN, AND PELVIS WITH CONTRAST TECHNIQUE: Multidetector CT imaging of the chest, abdomen and pelvis was performed following the standard protocol during bolus administration of intravenous contrast. CONTRAST:  131m ISOVUE-300 IOPAMIDOL (ISOVUE-300) INJECTION 61% COMPARISON:  Chest CT of 12/09/2015. Abdominal pelvic CT of 11/07/2015. FINDINGS: CT CHEST FINDINGS Cardiovascular: Right sided Port-A-Cath which terminates the superior caval/ atrial junction. Aortic and branch vessel atherosclerosis. Tortuous thoracic aorta. Normal heart size, without pericardial effusion. No central pulmonary embolism, on this non-dedicated study. Mediastinum/Nodes: Left supraclavicular nodes measure maximally 7 mm on image 4/series 2 versus 10 mm on the prior exam (when remeasured). No mediastinal  adenopathy. Right hilar nodal tissue is unchanged at 11 mm on image 24/ series 2. Not pathologic by size criteria. Dilated thoracic duct. Lungs/Pleura: Decrease in right pleural fluid, small. Bilateral pulmonary nodules, with interval development of cavitation. A newly cavitary 11 x 10 mm right apical nodule on image 25/ series 7 measured 12 x 10 mm previously. A more inferior right upper lobe 10 mm nodule on image 34/ series 7 is newly cavitary but similar in size. Superior segment left lower lobe index nodule measures 5 mm on image 40/series 7 versus 7 mm on the prior exam (when remeasured). Musculoskeletal: No acute osseous abnormality. CT ABDOMEN PELVIS FINDINGS Hepatobiliary: Suspect a right hepatic lobe 10 mm hemangioma on image  44/series 2. Vague hypo attenuating right hepatic lobe 9 mm lesion on image 62/ series 2 is unchanged and indeterminate. Normal gallbladder, without biliary ductal dilatation. Pancreas: Hypoattenuation involving the pancreatic body/ tail junction is ill-defined and results in upstream duct dilatation. Difficult to quantify secondary to ill-defined morphology. Measures on the order of 2.6 x 2.5 cm on image 55/series 2. Slightly more conspicuous today than at 1.9 x 0.8 cm on the prior. No acute pancreatitis. Spleen: Normal in size, without focal abnormality. Adrenals/Urinary Tract: Normal adrenal glands. Normal kidneys, without hydronephrosis. Normal urinary bladder. Stomach/Bowel: Normal stomach, without wall thickening. Colonic stool burden suggests constipation. Normal terminal ileum. Normal small bowel. Vascular/Lymphatic: Aortic and branch vessel atherosclerosis. No abdominal adenopathy. Suspect an enlarged right common iliac node at 11 mm on image 82/series 2. Compare 12 mm on image 46/series 2 of the prior exam (when remeasured). Reproductive: Hysterectomy. Septated cystic Lesion in the left hemipelvis measures 7.6 x 4.4 cm today versus 6.1 x 2.8 cm on the prior. Enlarged right ovary with solid and cystic components measures 5.5 x 3.5 cm today versus 5.8 x 3.7 cm on the prior. This is felt to be similar. Other: No significant free fluid. Persistent omental/peritoneal nodularity. A right-sided omental mass measures 2.2 x 3.6 cm on image 76/series 2. Compare 4.8 x 2.3 cm on the prior exam (when remeasured). Inferior omental caking within the left hemipelvis measures 1.8 cm on image 89/ series 2 versus 2.9 cm at the same level on the prior. No ascites. Musculoskeletal: Degenerative disc disease at the lumbosacral junction. IMPRESSION: CT CHEST IMPRESSION 1. Slight improvement in pulmonary metastasis. 2. Decrease in low left cervical/supraclavicular adenopathy, favoring response to therapy. 3. Decrease in small  right pleural effusion. CT ABDOMEN AND PELVIS IMPRESSION 1. Improvement in omental/peritoneal metastasis. 2. Enlarged cystic lesion within the left hemipelvis could represent a primary ovarian malignancy or metastasis. Right ovarian soft tissue fullness and heterogeneity is not significantly changed. 3. Pancreatic body/tail junction hypoattenuation is difficult to quantify secondary to morphology. However, felt to be increased in conspicuity today. Considerations include metastatic disease or a somewhat atypical distribution of pancreatic adenocarcinoma with metastasis. 4. Slight improvement in right common iliac high adenopathy. 5. Aortic atherosclerosis. Electronically Signed   By: Abigail Miyamoto M.D.   On: 02/04/2016 16:38   PACs images reviewed with patient and daughter at visit   Medications: I have reviewed the patient's current medications. Has been on hemocyte for past 2 weeks, tolerating without difficulty.  DISCUSSION Clinically she is progressively improving, the best in past week that she has been in months. Findings on CT reviewed, and they understand that comparison CT was actually a full month prior to start of treatment, such that increase in left pelvic mass may not really reflect response  to treatment thus far. I have explained again that surgery would not be curative in intent given innumerable pulmonary mets. Any surgery by gyn oncology likely would only be considered if much better response to additional chemo first, if gyn oncologists felt that the surgery would give additional benefit locally, and if risk of anesthesia not prohibitive given extent of pulmonary involvement. I have told them that we are clearly improving situation with present chemotherapy, which she is tolerating adequately, and have recommended that we continue with cycle 4 on schedule 02-13-16 even tho this is prior to follow up with Dr Alycia Rossetti. We have discussed metastatic cancer somewhat comparable to a chronic  illness that we try to control as we try to maintain quality of life, but cannot cure. We have talked about continuing present chemo to maximum benefit or maximum tolerance; they understand that other chemotherapy and nonchemotherapy systemic treatments are available if needed later. Patient and daughter seem to be gradually more accepting of the situation, initially having been very focused on surgery, and likely will appreciate Dr Elenora Gamma review also.   Assessment/Plan: 1. 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 Will give cycle 4 on 02-13-16 as long as ANC >=1.5 and platelets >=100k. She has follow up with Dr Alycia Rossetti on 12-20 .  2.Diabetes on metformin and SS insulin managed by Dr Sharlet Salina.Decadron around chemo as above, SS insulin with chemo infusion here.  3.PAC by IR 01-01-16.  4.Constipation improved, using miralax. 5.post hysterectomy without oophorectomy ~ 1970 6.flu vaccine 10-28-15 7.work noise exposure in past with decreased hearing 8.minimal past tobacco, DCd 1977. Second hand tobacco exposure. 9.Living WIll and HCPOA in place.  10.Social: daughter with 28 yo twins moved from [place] to be with patient. Patient's autistic son also in the home.  11..environmental allergies: better with flonase to ongoing claritin 12.anemia: stable, will check iron studies, continue hemocyte.   All questions answered and patient / daughter appear to understand discussion and are in agreement with continuing chemotherapy next week. Chemo and granix orders confirmed. Time spent 30 min including >50% counseling and coordination of care. Route PCP, copy Dr Alycia Rossetti    Evlyn Clines, MD   02/05/2016, 2:03 PM

## 2016-02-06 ENCOUNTER — Telehealth: Payer: Self-pay

## 2016-02-06 LAB — CA 125: CANCER ANTIGEN (CA) 125: 307.1 U/mL — AB (ref 0.0–38.1)

## 2016-02-06 NOTE — Telephone Encounter (Signed)
-----   Message from Gordy Levan, MD sent at 02/06/2016 12:34 PM EST ----- Labs seen and need follow up: please let them know marker is down to 307, continues to improve. No change in plan for next chemo

## 2016-02-06 NOTE — Telephone Encounter (Signed)
Told daughter the results of the CA-125 as noted below by Dr. Marko Plume.

## 2016-02-09 ENCOUNTER — Ambulatory Visit: Payer: Medicare Other | Admitting: Gynecologic Oncology

## 2016-02-13 ENCOUNTER — Other Ambulatory Visit: Payer: Medicare Other

## 2016-02-13 ENCOUNTER — Ambulatory Visit (HOSPITAL_BASED_OUTPATIENT_CLINIC_OR_DEPARTMENT_OTHER): Payer: Medicare Other

## 2016-02-13 ENCOUNTER — Ambulatory Visit: Payer: Medicare Other

## 2016-02-13 ENCOUNTER — Encounter: Payer: Self-pay | Admitting: General Practice

## 2016-02-13 ENCOUNTER — Other Ambulatory Visit (HOSPITAL_BASED_OUTPATIENT_CLINIC_OR_DEPARTMENT_OTHER): Payer: Medicare Other

## 2016-02-13 VITALS — BP 129/70 | HR 96 | Temp 98.0°F | Resp 16

## 2016-02-13 DIAGNOSIS — C786 Secondary malignant neoplasm of retroperitoneum and peritoneum: Secondary | ICD-10-CM

## 2016-02-13 DIAGNOSIS — C801 Malignant (primary) neoplasm, unspecified: Secondary | ICD-10-CM

## 2016-02-13 DIAGNOSIS — Z5111 Encounter for antineoplastic chemotherapy: Secondary | ICD-10-CM | POA: Diagnosis not present

## 2016-02-13 DIAGNOSIS — C482 Malignant neoplasm of peritoneum, unspecified: Secondary | ICD-10-CM

## 2016-02-13 DIAGNOSIS — E119 Type 2 diabetes mellitus without complications: Secondary | ICD-10-CM | POA: Diagnosis not present

## 2016-02-13 DIAGNOSIS — C569 Malignant neoplasm of unspecified ovary: Secondary | ICD-10-CM

## 2016-02-13 DIAGNOSIS — C7801 Secondary malignant neoplasm of right lung: Secondary | ICD-10-CM

## 2016-02-13 DIAGNOSIS — Z95828 Presence of other vascular implants and grafts: Secondary | ICD-10-CM

## 2016-02-13 DIAGNOSIS — C7802 Secondary malignant neoplasm of left lung: Secondary | ICD-10-CM

## 2016-02-13 LAB — CBC WITH DIFFERENTIAL/PLATELET
BASO%: 0.3 % (ref 0.0–2.0)
Basophils Absolute: 0 10*3/uL (ref 0.0–0.1)
EOS%: 0 % (ref 0.0–7.0)
Eosinophils Absolute: 0 10*3/uL (ref 0.0–0.5)
HCT: 29.4 % — ABNORMAL LOW (ref 34.8–46.6)
HGB: 9.2 g/dL — ABNORMAL LOW (ref 11.6–15.9)
LYMPH%: 8.8 % — AB (ref 14.0–49.7)
MCH: 22.1 pg — ABNORMAL LOW (ref 25.1–34.0)
MCHC: 31.2 g/dL — AB (ref 31.5–36.0)
MCV: 70.8 fL — ABNORMAL LOW (ref 79.5–101.0)
MONO#: 0 10*3/uL — ABNORMAL LOW (ref 0.1–0.9)
MONO%: 0.4 % (ref 0.0–14.0)
NEUT%: 90.5 % — AB (ref 38.4–76.8)
NEUTROS ABS: 6.9 10*3/uL — AB (ref 1.5–6.5)
Platelets: 262 10*3/uL (ref 145–400)
RBC: 4.15 10*6/uL (ref 3.70–5.45)
RDW: 19 % — ABNORMAL HIGH (ref 11.2–14.5)
WBC: 7.6 10*3/uL (ref 3.9–10.3)
lymph#: 0.7 10*3/uL — ABNORMAL LOW (ref 0.9–3.3)

## 2016-02-13 LAB — COMPREHENSIVE METABOLIC PANEL
ALBUMIN: 3.7 g/dL (ref 3.5–5.0)
ALK PHOS: 86 U/L (ref 40–150)
ALT: 10 U/L (ref 0–55)
AST: 17 U/L (ref 5–34)
Anion Gap: 15 mEq/L — ABNORMAL HIGH (ref 3–11)
BUN: 23.8 mg/dL (ref 7.0–26.0)
CALCIUM: 9.9 mg/dL (ref 8.4–10.4)
CO2: 21 mEq/L — ABNORMAL LOW (ref 22–29)
CREATININE: 1.1 mg/dL (ref 0.6–1.1)
Chloride: 102 mEq/L (ref 98–109)
EGFR: 59 mL/min/{1.73_m2} — ABNORMAL LOW (ref >90)
Glucose: 363 mg/dl — ABNORMAL HIGH (ref 70–140)
POTASSIUM: 4.4 meq/L (ref 3.5–5.1)
SODIUM: 137 meq/L (ref 136–145)
Total Bilirubin: 0.4 mg/dL (ref 0.20–1.20)
Total Protein: 7.4 g/dL (ref 6.4–8.3)

## 2016-02-13 LAB — WHOLE BLOOD GLUCOSE
Glucose: 191 mg/dL — ABNORMAL HIGH (ref 70–100)
HRS PC: 1 Hours

## 2016-02-13 LAB — IRON AND TIBC
%SAT: 21 % (ref 21–57)
IRON: 59 ug/dL (ref 41–142)
TIBC: 276 ug/dL (ref 236–444)
UIBC: 218 ug/dL (ref 120–384)

## 2016-02-13 LAB — FERRITIN: Ferritin: 282 ng/ml — ABNORMAL HIGH (ref 9–269)

## 2016-02-13 MED ORDER — FAMOTIDINE IN NACL 20-0.9 MG/50ML-% IV SOLN
20.0000 mg | Freq: Once | INTRAVENOUS | Status: AC
  Administered 2016-02-13: 20 mg via INTRAVENOUS

## 2016-02-13 MED ORDER — SODIUM CHLORIDE 0.9 % IV SOLN
Freq: Once | INTRAVENOUS | Status: AC
  Administered 2016-02-13: 11:00:00 via INTRAVENOUS
  Filled 2016-02-13: qty 5

## 2016-02-13 MED ORDER — SODIUM CHLORIDE 0.9 % IV SOLN
Freq: Once | INTRAVENOUS | Status: AC
  Administered 2016-02-13: 10:00:00 via INTRAVENOUS

## 2016-02-13 MED ORDER — HEPARIN SOD (PORK) LOCK FLUSH 100 UNIT/ML IV SOLN
500.0000 [IU] | Freq: Once | INTRAVENOUS | Status: AC | PRN
  Administered 2016-02-13: 500 [IU]
  Filled 2016-02-13: qty 5

## 2016-02-13 MED ORDER — SODIUM CHLORIDE 0.9% FLUSH
10.0000 mL | INTRAVENOUS | Status: DC | PRN
  Administered 2016-02-13: 10 mL via INTRAVENOUS
  Filled 2016-02-13: qty 10

## 2016-02-13 MED ORDER — ONDANSETRON HCL 8 MG PO TABS
ORAL_TABLET | ORAL | Status: AC
  Filled 2016-02-13: qty 1

## 2016-02-13 MED ORDER — DIPHENHYDRAMINE HCL 50 MG/ML IJ SOLN
25.0000 mg | Freq: Once | INTRAMUSCULAR | Status: AC
  Administered 2016-02-13: 25 mg via INTRAVENOUS

## 2016-02-13 MED ORDER — CARBOPLATIN CHEMO INJECTION 600 MG/60ML
426.6000 mg | Freq: Once | INTRAVENOUS | Status: AC
  Administered 2016-02-13: 430 mg via INTRAVENOUS
  Filled 2016-02-13: qty 43

## 2016-02-13 MED ORDER — INSULIN REGULAR HUMAN 100 UNIT/ML IJ SOLN
2.0000 [IU] | Freq: Once | INTRAMUSCULAR | Status: AC | PRN
Start: 2016-02-13 — End: 2016-02-13
  Administered 2016-02-13: 11:00:00 via SUBCUTANEOUS
  Filled 2016-02-13: qty 0.08

## 2016-02-13 MED ORDER — SODIUM CHLORIDE 0.9% FLUSH
10.0000 mL | INTRAVENOUS | Status: DC | PRN
  Administered 2016-02-13: 10 mL
  Filled 2016-02-13: qty 10

## 2016-02-13 MED ORDER — SODIUM CHLORIDE 0.9 % IV SOLN
175.0000 mg/m2 | Freq: Once | INTRAVENOUS | Status: AC
  Administered 2016-02-13: 318 mg via INTRAVENOUS
  Filled 2016-02-13: qty 53

## 2016-02-13 MED ORDER — INSULIN REGULAR HUMAN 100 UNIT/ML IJ SOLN
2.0000 [IU] | Freq: Once | INTRAMUSCULAR | Status: AC
  Administered 2016-02-13: 2 [IU] via SUBCUTANEOUS
  Filled 2016-02-13: qty 0.02

## 2016-02-13 MED ORDER — FAMOTIDINE IN NACL 20-0.9 MG/50ML-% IV SOLN
INTRAVENOUS | Status: AC
  Filled 2016-02-13: qty 50

## 2016-02-13 MED ORDER — ONDANSETRON HCL 8 MG PO TABS
8.0000 mg | ORAL_TABLET | Freq: Once | ORAL | Status: AC
  Administered 2016-02-13: 8 mg via ORAL

## 2016-02-13 MED ORDER — DIPHENHYDRAMINE HCL 50 MG/ML IJ SOLN
INTRAMUSCULAR | Status: AC
  Filled 2016-02-13: qty 1

## 2016-02-13 NOTE — Patient Instructions (Signed)
[facility] Discharge Instructions for Patients Receiving Chemotherapy  Today you received the following chemotherapy agents: Taxol and Carboplatin. We gave you regular insulin 8 units at 10:48 am .  To help prevent nausea and vomiting after your treatment, we encourage you to take your nausea medication: Zofran. Take one every 8 hours as needed. If you develop nausea and vomiting that is not controlled by your nausea medication, call the clinic.   BELOW ARE SYMPTOMS THAT SHOULD BE REPORTED IMMEDIATELY:  *FEVER GREATER THAN 100.5 F  *CHILLS WITH OR WITHOUT FEVER  NAUSEA AND VOMITING THAT IS NOT CONTROLLED WITH YOUR NAUSEA MEDICATION  *UNUSUAL SHORTNESS OF BREATH  *UNUSUAL BRUISING OR BLEEDING  TENDERNESS IN MOUTH AND THROAT WITH OR WITHOUT PRESENCE OF ULCERS  *URINARY PROBLEMS  *BOWEL PROBLEMS  UNUSUAL RASH Items with * indicate a potential emergency and should be followed up as soon as possible.  Feel free to call the clinic should you have any questions or concerns. The clinic phone number is [phone].  Please show the [place] at check-in to the Emergency Department and triage nurse.

## 2016-02-13 NOTE — Progress Notes (Signed)
[facility] Spiritual Care Note  Unable to follow up with Leslie Rowe in person today while she was in infusion, I left a handwritten note of encouragement and Christmas wishes for her to receive during her treatment.  Included my business card and encouraged her to reach out anytime for further support.  [place], [place], [facility] Pager [phone] Voicemail [phone]

## 2016-02-16 ENCOUNTER — Telehealth: Payer: Self-pay | Admitting: Genetic Counselor

## 2016-02-16 NOTE — Telephone Encounter (Signed)
Discussed with Ms. Passow and her daughter that her genetic test results were negative for mutations within any of 28 genes on the [facility] Hereditary Cancer Panel through [employer].  Additionally, no variants of uncertain significance (VUSes).  Discussed that this is most likely reassuring that her cancer was sporadic, but that there is always a chance that we could be missing something on current testing.  We are still waiting for the tumor/HRD test result to come back, so I will call them as soon as a I know more about that.  This final test does not tell us about genetics, but can potentially be helpful to inform her treatment plan.  Ms. Krone is welcome to call with any questions she has in the meantime.

## 2016-02-17 ENCOUNTER — Ambulatory Visit (HOSPITAL_BASED_OUTPATIENT_CLINIC_OR_DEPARTMENT_OTHER): Payer: Medicare Other

## 2016-02-17 VITALS — BP 125/75 | HR 100 | Temp 97.9°F | Resp 18

## 2016-02-17 DIAGNOSIS — C7801 Secondary malignant neoplasm of right lung: Secondary | ICD-10-CM

## 2016-02-17 DIAGNOSIS — Z5189 Encounter for other specified aftercare: Secondary | ICD-10-CM

## 2016-02-17 DIAGNOSIS — C786 Secondary malignant neoplasm of retroperitoneum and peritoneum: Secondary | ICD-10-CM

## 2016-02-17 DIAGNOSIS — C7802 Secondary malignant neoplasm of left lung: Secondary | ICD-10-CM

## 2016-02-17 DIAGNOSIS — C801 Malignant (primary) neoplasm, unspecified: Secondary | ICD-10-CM

## 2016-02-17 DIAGNOSIS — Z95828 Presence of other vascular implants and grafts: Secondary | ICD-10-CM

## 2016-02-17 MED ORDER — TBO-FILGRASTIM 300 MCG/0.5ML ~~LOC~~ SOSY
300.0000 ug | PREFILLED_SYRINGE | Freq: Once | SUBCUTANEOUS | Status: AC
  Administered 2016-02-17: 300 ug via SUBCUTANEOUS
  Filled 2016-02-17: qty 0.5

## 2016-02-17 NOTE — Patient Instructions (Signed)

## 2016-02-18 ENCOUNTER — Ambulatory Visit (HOSPITAL_BASED_OUTPATIENT_CLINIC_OR_DEPARTMENT_OTHER): Payer: Medicare Other

## 2016-02-18 ENCOUNTER — Encounter: Payer: Self-pay | Admitting: Gynecologic Oncology

## 2016-02-18 ENCOUNTER — Other Ambulatory Visit (HOSPITAL_BASED_OUTPATIENT_CLINIC_OR_DEPARTMENT_OTHER): Payer: Medicare Other | Admitting: *Deleted

## 2016-02-18 ENCOUNTER — Other Ambulatory Visit: Payer: Self-pay | Admitting: Gynecologic Oncology

## 2016-02-18 ENCOUNTER — Ambulatory Visit: Payer: Medicare Other | Attending: Gynecologic Oncology | Admitting: Gynecologic Oncology

## 2016-02-18 ENCOUNTER — Telehealth: Payer: Self-pay

## 2016-02-18 ENCOUNTER — Other Ambulatory Visit (HOSPITAL_BASED_OUTPATIENT_CLINIC_OR_DEPARTMENT_OTHER): Payer: Medicare Other

## 2016-02-18 VITALS — BP 130/71 | HR 91 | Temp 98.3°F | Resp 20

## 2016-02-18 VITALS — BP 142/81 | HR 103 | Temp 97.7°F | Resp 18 | Ht 67.0 in | Wt 140.2 lb

## 2016-02-18 DIAGNOSIS — M899 Disorder of bone, unspecified: Secondary | ICD-10-CM | POA: Diagnosis not present

## 2016-02-18 DIAGNOSIS — C569 Malignant neoplasm of unspecified ovary: Secondary | ICD-10-CM

## 2016-02-18 DIAGNOSIS — E119 Type 2 diabetes mellitus without complications: Secondary | ICD-10-CM | POA: Diagnosis present

## 2016-02-18 DIAGNOSIS — E785 Hyperlipidemia, unspecified: Secondary | ICD-10-CM | POA: Insufficient documentation

## 2016-02-18 DIAGNOSIS — C7801 Secondary malignant neoplasm of right lung: Secondary | ICD-10-CM

## 2016-02-18 DIAGNOSIS — Z794 Long term (current) use of insulin: Secondary | ICD-10-CM | POA: Insufficient documentation

## 2016-02-18 DIAGNOSIS — D701 Agranulocytosis secondary to cancer chemotherapy: Secondary | ICD-10-CM | POA: Diagnosis not present

## 2016-02-18 DIAGNOSIS — Z95828 Presence of other vascular implants and grafts: Secondary | ICD-10-CM

## 2016-02-18 DIAGNOSIS — R918 Other nonspecific abnormal finding of lung field: Secondary | ICD-10-CM | POA: Diagnosis not present

## 2016-02-18 DIAGNOSIS — J90 Pleural effusion, not elsewhere classified: Secondary | ICD-10-CM | POA: Diagnosis not present

## 2016-02-18 DIAGNOSIS — C786 Secondary malignant neoplasm of retroperitoneum and peritoneum: Secondary | ICD-10-CM | POA: Diagnosis not present

## 2016-02-18 DIAGNOSIS — R188 Other ascites: Secondary | ICD-10-CM | POA: Insufficient documentation

## 2016-02-18 DIAGNOSIS — C7802 Secondary malignant neoplasm of left lung: Secondary | ICD-10-CM | POA: Diagnosis not present

## 2016-02-18 DIAGNOSIS — C7889 Secondary malignant neoplasm of other digestive organs: Secondary | ICD-10-CM | POA: Insufficient documentation

## 2016-02-18 DIAGNOSIS — C801 Malignant (primary) neoplasm, unspecified: Secondary | ICD-10-CM

## 2016-02-18 DIAGNOSIS — Z87891 Personal history of nicotine dependence: Secondary | ICD-10-CM | POA: Insufficient documentation

## 2016-02-18 DIAGNOSIS — K219 Gastro-esophageal reflux disease without esophagitis: Secondary | ICD-10-CM | POA: Insufficient documentation

## 2016-02-18 DIAGNOSIS — I700 Atherosclerosis of aorta: Secondary | ICD-10-CM | POA: Diagnosis not present

## 2016-02-18 DIAGNOSIS — R109 Unspecified abdominal pain: Secondary | ICD-10-CM | POA: Insufficient documentation

## 2016-02-18 DIAGNOSIS — I10 Essential (primary) hypertension: Secondary | ICD-10-CM | POA: Diagnosis not present

## 2016-02-18 DIAGNOSIS — C7800 Secondary malignant neoplasm of unspecified lung: Secondary | ICD-10-CM

## 2016-02-18 DIAGNOSIS — Z8543 Personal history of malignant neoplasm of ovary: Secondary | ICD-10-CM | POA: Diagnosis not present

## 2016-02-18 DIAGNOSIS — R3915 Urgency of urination: Secondary | ICD-10-CM

## 2016-02-18 LAB — URINALYSIS, MICROSCOPIC - CHCC
Bilirubin (Urine): NEGATIVE
Glucose: NEGATIVE mg/dL
Ketones: NEGATIVE mg/dL
Nitrite: NEGATIVE
SPECIFIC GRAVITY, URINE: 1.01 (ref 1.003–1.035)
UROBILINOGEN UR: 0.2 mg/dL (ref 0.2–1)
pH: 6 (ref 4.6–8.0)

## 2016-02-18 LAB — CBC WITH DIFFERENTIAL/PLATELET
BASO%: 0.2 % (ref 0.0–2.0)
Basophils Absolute: 0 10*3/uL (ref 0.0–0.1)
EOS%: 0.6 % (ref 0.0–7.0)
Eosinophils Absolute: 0.1 10*3/uL (ref 0.0–0.5)
HCT: 28.1 % — ABNORMAL LOW (ref 34.8–46.6)
HGB: 8.8 g/dL — ABNORMAL LOW (ref 11.6–15.9)
LYMPH%: 9.2 % — AB (ref 14.0–49.7)
MCH: 22.2 pg — ABNORMAL LOW (ref 25.1–34.0)
MCHC: 31.1 g/dL — AB (ref 31.5–36.0)
MCV: 71.3 fL — ABNORMAL LOW (ref 79.5–101.0)
MONO#: 0.2 10*3/uL (ref 0.1–0.9)
MONO%: 1.4 % (ref 0.0–14.0)
NEUT#: 11.9 10*3/uL — ABNORMAL HIGH (ref 1.5–6.5)
NEUT%: 88.6 % — AB (ref 38.4–76.8)
Platelets: 205 10*3/uL (ref 145–400)
RBC: 3.95 10*6/uL (ref 3.70–5.45)
RDW: 19.3 % — ABNORMAL HIGH (ref 11.2–14.5)
WBC: 13.4 10*3/uL — ABNORMAL HIGH (ref 3.9–10.3)
lymph#: 1.2 10*3/uL (ref 0.9–3.3)

## 2016-02-18 LAB — COMPREHENSIVE METABOLIC PANEL
ALT: 19 U/L (ref 0–55)
ANION GAP: 10 meq/L (ref 3–11)
AST: 25 U/L (ref 5–34)
Albumin: 3.5 g/dL (ref 3.5–5.0)
Alkaline Phosphatase: 74 U/L (ref 40–150)
BUN: 15.5 mg/dL (ref 7.0–26.0)
CHLORIDE: 103 meq/L (ref 98–109)
CO2: 24 meq/L (ref 22–29)
CREATININE: 0.7 mg/dL (ref 0.6–1.1)
Calcium: 10 mg/dL (ref 8.4–10.4)
EGFR: 90 mL/min/{1.73_m2} — AB (ref >90)
Glucose: 183 mg/dl — ABNORMAL HIGH (ref 70–140)
Potassium: 4.1 mEq/L (ref 3.5–5.1)
SODIUM: 137 meq/L (ref 136–145)
Total Bilirubin: 0.5 mg/dL (ref 0.20–1.20)
Total Protein: 6.7 g/dL (ref 6.4–8.3)

## 2016-02-18 MED ORDER — SODIUM CHLORIDE 0.9% FLUSH
10.0000 mL | INTRAVENOUS | Status: DC | PRN
  Administered 2016-02-18: 10 mL via INTRAVENOUS
  Filled 2016-02-18: qty 10

## 2016-02-18 MED ORDER — HEPARIN SOD (PORK) LOCK FLUSH 100 UNIT/ML IV SOLN
500.0000 [IU] | Freq: Once | INTRAVENOUS | Status: AC | PRN
  Administered 2016-02-18: 500 [IU] via INTRAVENOUS
  Filled 2016-02-18: qty 5

## 2016-02-18 MED ORDER — TBO-FILGRASTIM 300 MCG/0.5ML ~~LOC~~ SOSY
300.0000 ug | PREFILLED_SYRINGE | Freq: Once | SUBCUTANEOUS | Status: AC
  Administered 2016-02-18: 300 ug via SUBCUTANEOUS
  Filled 2016-02-18: qty 0.5

## 2016-02-18 NOTE — Patient Instructions (Signed)
Tbo-Filgrastim injection What is this medicine? TBO-FILGRASTIM (T B O fil GRA stim) is a granulocyte colony-stimulating factor that stimulates the growth of neutrophils, a type of white blood cell important in the body's fight against infection. It is used to reduce the incidence of fever and infection in patients with certain types of cancer who are receiving chemotherapy that affects the bone marrow. COMMON BRAND NAME(S): Granix What should I tell my health care provider before I take this medicine? They need to know if you have any of these conditions: -ongoing radiation therapy -sickle cell anemia -an unusual or allergic reaction to tbo-filgrastim, filgrastim, pegfilgrastim, other medicines, foods, dyes, or preservatives -pregnant or trying to get pregnant -breast-feeding How should I use this medicine? This medicine is for injection under the skin. If you get this medicine at home, you will be taught how to prepare and give this medicine. Refer to the Instructions for Use that come with your medication packaging. Use exactly as directed. Take your medicine at regular intervals. Do not take your medicine more often than directed. It is important that you put your used needles and syringes in a special sharps container. Do not put them in a trash can. If you do not have a sharps container, call your pharmacist or healthcare provider to get one. Talk to your pediatrician regarding the use of this medicine in children. Special care may be needed. What if I miss a dose? It is important not to miss your dose. Call your doctor or health care professional if you miss a dose. What may interact with this medicine? This medicine may interact with the following medications: -medicines that may cause a release of neutrophils, such as lithium What should I watch for while using this medicine? You may need blood work done while you are taking this medicine. What side effects may I notice from receiving  this medicine? Side effects that you should report to your doctor or health care professional as soon as possible: -allergic reactions like skin rash, itching or hives, swelling of the face, lips, or tongue -shortness of breath or breathing problems -fever -pain, redness, or irritation at site where injected -pinpoint red spots on the skin -stomach or side pain, or pain at the shoulder -swelling -tiredness -trouble passing urine Side effects that usually do not require medical attention (report to your doctor or health care professional if they continue or are bothersome): -bone pain -muscle pain Where should I keep my medicine? Keep out of the reach of children. Store in a refrigerator between 2 and 8 degrees C (36 and 46 degrees F). Keep in carton to protect from light. Throw away this medicine if it is left out of the refrigerator for more than 5 consecutive days. Throw away any unused medicine after the expiration date.  2017 Elsevier/Gold Standard (2015-03-20 12:15:25)  

## 2016-02-18 NOTE — Progress Notes (Signed)
Consult Note: Gyn-Onc  Leslie Rowe 80 y.o. female  CC:  No chief complaint on file.   HPI: Patient was seen in consultation at the request of Dr. Pricilla Holm.  Patient is a very pleasant 80 year old gravida 3 para 2 who has been on a variety of different medications for her diabetes. Some of intermittently caused her to have some abdominal pain as well as gastroesophageal reflux symptoms. She was placed on Januvia in September of last year. Since that time she began noticing some abdominal soreness and discomfort. Gradually increased over the summer. She subsequently stopped taking the medication in July and she thinks that the soreness improved a little bit. However, she was seen by her physician in August at that time blood work was done and everything was "okay". The pain progressively increased and started moving to different areas in her abdomen. She will bring up merely noticed the pain at night when she was unable to lay on her side and one half to lay on her back. Concomitant with all that she does endorse some decreased appetite as well as early satiety. She denies any nausea or vomiting but she does have that felt like eating. She's had no change in her bladder habits but has noticed some increased constipation and occasionally some diarrhea. She is up-to-date on her colonoscopy having had her last one 5 years ago. She had her mammogram this year. Of note her sister is currently being treated for breast cancer here at the cancer center. She is 78 years old and is undergoing radiation therapy. She had a brother who had cancer of unknown primary.  She had a CT scan performed 9/8 that revealed: FINDINGS: -Lower chest: Numerous bibasilar pulmonary nodules highly suspicious for pulmonary metastases. Nodules measure up to 10 mm LEFT image 3 and 8 mm RIGHT image 5. Small RIGHT pleural effusion. Mild RIGHT basilar atelectasis. -Hepatobiliary: Nonspecific RIGHT lobe liver lesion 11 x 7 mm  image 25. Remainder of liver normal appearance. Gallbladder unremarkable. -Pancreas: Mild pancreatic udctal dilatation at tail with a subtle low-attenuation region at pancreatic tail cannot exclude mass 19 x 8 mm. Adjacent soft tissue likely tumor in lesser sac. -Spleen: Normal appearance -Adrenals/Urinary Tract: Adrenal thickening without discrete mass. Kidneys, ureters, and bladder unremarkable. -Stomach/Bowel: Diffuse colonic diverticulosis. Distal small bowel loops on opacified and under distended limiting assessment. Food debris and contrast in stomach. -Vascular/Lymphatic: Atherosclerotic calcifications aorta. Attenuation retrocrural nodes largest 16 mm short axis image 16. RIGHT cardiophrenic angle nodes largest 8 mm short axis image 9. 8 mm gastrohepatic ligament node image 16.  -Reproductive: Uterus surgically absent. Masslike enlargement in RIGHT adnexa question RIGHT ovarian neoplasm, solid and cystic, 5.8 x 3.7 x 4.7 cm. Additional predominantly cystic mass with smaller soft tissue component in LEFT adnexa likely LEFT ovarian origin 6.1 x 2.8 x 7.0 cm. Findings could represent ovarian neoplasms or tumor deposits at the ovaries from peritoneal carcinomatosis. Diffuse omental tumor infiltration up to 3 cm thick. Additional tumor deposits are seen in the pericolic gutters bilaterally. Large tumor deposit at the cul de sac at least 4.1 cm diameter. Probable additional tumor deposits along small bowel loops and colon in pelvis as well as at the pericolic gutters.  -Other: Small amount of perihepatic free fluid. BILATERAL inguinal hernias containing fat. No free air. IMPRESSION: -Multiple pulmonary nodules bilaterally compatible with pulmonary metastases. -Peritoneal carcinomatosis with tumor deposits throughout omentum andat the pericolic gutters as well as within the pelvis. -BILATERAL cystic and solid ovarian masses,  predominately cystic on LEFT and predominately solid on RIGHT, question  ovarian neoplasmsversus tumor deposits at the ovaries bilaterally from peritonealcarcinomatosis.  Low-attenuation retrocrural nodes.  Small RIGHT pleural effusion and minimal perihepatic ascites.  Question metastasis at pancreatic tail.  Diagnosis Soft Tissue Needle Core Biopsy, Peritoneal Cavity - ADENOCARCINOMA. - SEE MICROSCOPIC DESCRIPTION.  Microscopic Comment The morphologic features are compatible with metastatic ovarian cancer. There is sufficient material for additional studies if requested. (JDP:gt, 11/17/15)  CA-125 308.  Interval History:  His 3 cycles of neoadjuvant chemotherapy under the care of Dr. Marko Plume. Her CA 125 is down to 309 however she did peak at over 700 and October prior to starting chemotherapy. CT 02/04/16: CT CHEST IMPRESSION  1. Slight improvement in pulmonary metastasis. 2. Decrease in low left cervical/supraclavicular adenopathy, favoring response to therapy. 3. Decrease in small right pleural effusion.  CT ABDOMEN AND PELVIS IMPRESSION  1. Improvement in omental/peritoneal metastasis. 2. Enlarged cystic lesion within the left hemipelvis could represent a primary ovarian malignancy or metastasis. Right ovarian soft tissue fullness and heterogeneity is not significantly changed. 3. Pancreatic body/tail junction hypoattenuation is difficult to quantify secondary to morphology. However, felt to be increased in conspicuity today. Considerations include metastatic disease or a somewhat atypical distribution of pancreatic adenocarcinoma with metastasis. 4. Slight improvement in right common iliac high adenopathy. 5. Aortic atherosclerosis.  She's overall doing feeling quite well. They didn't get the genetic testing results and she has germline negative testing. Of course, somatic testing is still pending. That would not be available until after he proceed with definitive surgery. I reviewed By line the CT scan with the patient and her daughter.  While there are areas of improvement, stability, progression, overall the impression is that there is improvement in her CT scan. Of note we need to remember that this CTs being compared to one that was over a month prior to her initiating chemotherapy. She is overall feeling quite well. This is day 5 after chemotherapy she starting to notice a bit of improvement. She received her Neupogen injection yesterday. She does have some bladder sensitivity and some urgency. We will check a urinalysis today.  Review of Systems: Constitutional: Denies fever. Skin: No rash Cardiovascular: No chest pain, shortness of breath, or edema  Pulmonary: No cough  Gastro Intestinal: Reporting intermittent lower abdominal soreness.  No nausea, vomiting, + constipation Genitourinary:   Denies vaginal bleeding and discharge.  Musculoskeletal: No joint swelling or pain.  Neurologic: No weakness.  Psychology: Worried  Current Meds:  Outpatient Encounter Prescriptions as of 02/18/2016  Medication Sig  . dexamethasone (DECADRON) 4 MG tablet Take 5 tablets with food 12 hrs and 6 hrs prior to Taxol Chemotherapy.  Marland Kitchen HEMOCYTE 324 (106 Fe) MG TABS tablet Take 1 tablet (106 mg of iron total) by mouth daily.  . insulin lispro (HUMALOG KWIKPEN) 100 UNIT/ML KiwkPen Check sugars and if: 0-150 take no insulin, 151-250 take 2 units, 251-300 take 4 units, 301-400, take 6 units, >400 call office.  . lidocaine-prilocaine (EMLA) cream Apply to PortaCath 1-2 hrs prior to access as directed to numb area.  Marland Kitchen lisinopril-hydrochlorothiazide (PRINZIDE,ZESTORETIC) 20-12.5 MG tablet take 1 tablet by mouth once daily  . loratadine (CLARITIN) 10 MG tablet Take 10 mg by mouth daily as needed for allergies.  Marland Kitchen LORazepam (ATIVAN) 0.5 MG tablet Place 1 tablet under the tongue or swallow every 6 hrs as needed for nausea. Will make drowsy.  . Magnesium Hydroxide (MILK OF MAGNESIA CONCENTRATE PO) Take 1 Dose  by mouth daily as needed.  . Melatonin 3 MG  CAPS Take 1 capsule by mouth at bedtime as needed.  . metFORMIN (GLUCOPHAGE) 1000 MG tablet take 1 tablet by mouth twice a day with food  . multivitamin (THERAGRAN) tablet Take 1 tablet by mouth daily.    . ondansetron (ZOFRAN) 8 MG tablet Take 1 tablet (8 mg total) by mouth every 8 (eight) hours as needed for nausea or vomiting (Will not make drowsy.).  Marland Kitchen polyethylene glycol (MIRALAX / GLYCOLAX) packet Take 17 g by mouth 2 (two) times daily as needed.  . simvastatin (ZOCOR) 40 MG tablet take 1 tablet by mouth at bedtime (Patient not taking: Reported on 02/05/2016)   Facility-Administered Encounter Medications as of 02/18/2016  Medication  . 0.9 %  sodium chloride infusion    Allergy:  Allergies  Allergen Reactions  . Codeine Nausea And Vomiting    Social Hx:   Social History   Social History  . Marital status: Divorced    Spouse name: N/A  . Number of children: 2  . Years of education: 12   Occupational History  . retired Astronomer Tobacco   Social History Main Topics  . Smoking status: Former Smoker    Packs/day: 0.25    Years: 7.00    Types: Cigarettes    Quit date: 03/01/1978  . Smokeless tobacco: Never Used     Comment: smoked 3-5 cigs per day for 7-8 yrs  . Alcohol use No  . Drug use: No  . Sexual activity: Not Currently   Other Topics Concern  . Not on file   Social History Narrative   HSG. Married '60 - 1yrs/divorced. Married '80's - 71 yrs/divorced. 1 dtr - '70, 1 son - '64. 2 grandchildren. Retired - [employer]. Lives in own home, son is autistic and lives with her. Staying busy.              Past Surgical Hx:  Past Surgical History:  Procedure Laterality Date  . ABDOMINAL HYSTERECTOMY     total  . IR GENERIC HISTORICAL  01/01/2016   IR US GUIDE VASC ACCESS RIGHT 01/01/2016 Greggory Keen, MD [facility]  . IR GENERIC HISTORICAL  01/01/2016   IR FLUORO GUIDE PORT INSERTION RIGHT 01/01/2016 Greggory Keen, MD [facility]    Past Medical Hx:  Past  Medical History:  Diagnosis Date  . Diabetes mellitus    type II, mild  . Hyperlipidemia   . 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       Family Hx:  Family History  Problem Relation Age of Onset  . Hypertension Mother   . Other Mother     CVa  . Stroke Mother   . Cancer Brother     NOS cancer w/ metastasis; d. 95s  . Other Son     Autistic  . Hypertension Father   . Stroke Father   . Hypertension Sister   . Breast cancer Sister 50  . Ovarian cysts Sister     TAH-BSO for benign ovarian cyst  . Seizures Brother     d. 78  . Diabetes Brother   . Prostate cancer Brother     dx "awhile ago"  . Cancer Cousin     maternal 1st cousin, once-removed dx NOS cancer in his late 37s-early 60s  Vitals:  There were no vitals taken for this visit.  Physical Exam: Well-nourished older white female in no acute distress.  Abdomen: Well-healed vertical midline incision with no evidence of an incisional hernia. Positive fluid wave. Abdomen is soft slightly distended. Nontender. There is no rebound or guarding. There is no distinct masses.  Pelvic: External genitalia within normal limits. The cervix is palpably normal. There is approximately 7 cm adnexal mass on the left that is somewhat tender. I cannot appreciate any right adnexal masses.  Assessment/Plan: 80 year old gravida 3 para 2 with Stage IV primary peritoneal carcinoma who is currently treated with a neoadjuvant approach. She is status post 4 cycles of chemotherapy and had imaging after cycle #3 which is reviewed above. I am in favor as is Dr. Marko Plume of continuing through cycle #6 of chemotherapy and then repeating her imaging. At that time we'll decide whether or not to proceed with surgery.  The patient's questions as well as those of her daughter were elicited  in answer to their satisfaction.  We'll check a urinalysis today.  Greater than 25 minutes face-to-face time was spent with the patient and the daughter.  I will plan on seeing her back after cycle #6 of chemotherapy.  Huckleberry Martinson A., MD 02/18/2016, 8:27 AM

## 2016-02-18 NOTE — Telephone Encounter (Signed)
Notified of negative urinalysis result. Pt aware we will only call her if urine culture is positive.

## 2016-02-18 NOTE — Patient Instructions (Signed)
folow up with Dr Alycia Rossetti in 2 months.

## 2016-02-18 NOTE — Addendum Note (Signed)
Addended by: Joylene John D on: 02/18/2016 12:10 PM   Modules accepted: Orders

## 2016-02-19 ENCOUNTER — Ambulatory Visit: Payer: Medicare Other | Admitting: Oncology

## 2016-02-19 ENCOUNTER — Other Ambulatory Visit: Payer: Medicare Other

## 2016-02-19 ENCOUNTER — Ambulatory Visit: Payer: Medicare Other

## 2016-02-19 LAB — URINE CULTURE

## 2016-02-20 ENCOUNTER — Telehealth: Payer: Self-pay | Admitting: Genetic Counselor

## 2016-02-20 ENCOUNTER — Ambulatory Visit: Payer: Self-pay | Admitting: Genetic Counselor

## 2016-02-20 ENCOUNTER — Telehealth: Payer: Self-pay

## 2016-02-20 DIAGNOSIS — Z809 Family history of malignant neoplasm, unspecified: Secondary | ICD-10-CM

## 2016-02-20 DIAGNOSIS — Z803 Family history of malignant neoplasm of breast: Secondary | ICD-10-CM

## 2016-02-20 DIAGNOSIS — Z1379 Encounter for other screening for genetic and chromosomal anomalies: Secondary | ICD-10-CM

## 2016-02-20 DIAGNOSIS — C801 Malignant (primary) neoplasm, unspecified: Secondary | ICD-10-CM

## 2016-02-20 DIAGNOSIS — C786 Secondary malignant neoplasm of retroperitoneum and peritoneum: Secondary | ICD-10-CM

## 2016-02-20 NOTE — Telephone Encounter (Signed)
Spoke with pt about negative urine culture result. Pt verbalized understanding.

## 2016-02-20 NOTE — Telephone Encounter (Signed)
Followed up with Leslie Rowe and her daughter to let her know that tumor testing was also negative for any somatic BRCA mutations or for instability.  Will share this information with her doctor.

## 2016-02-24 ENCOUNTER — Other Ambulatory Visit: Payer: Self-pay | Admitting: Oncology

## 2016-02-25 ENCOUNTER — Other Ambulatory Visit: Payer: Self-pay | Admitting: Oncology

## 2016-02-25 DIAGNOSIS — C482 Malignant neoplasm of peritoneum, unspecified: Secondary | ICD-10-CM

## 2016-02-25 DIAGNOSIS — Z1379 Encounter for other screening for genetic and chromosomal anomalies: Secondary | ICD-10-CM | POA: Insufficient documentation

## 2016-02-25 NOTE — Progress Notes (Signed)
GENETIC TEST RESULT  HPI: Ms. Soohoo was previously seen in the [place] clinic due to a personal history of peritoneal cancer, family history of breast, prostate, and other cancers, and concerns regarding a hereditary predisposition to cancer. Please refer to our prior cancer genetics clinic note from January 27, 2016 for more information regarding Ms. Hartshorne's medical, social and family histories, and our assessment and recommendations, at the time. Ms. Sasaki recent genetic test results were disclosed to her, as were recommendations warranted by these results. These results and recommendations are discussed in more detail below.  GENETIC TEST RESULTS: Genetic testing reported out on February 04, 2016 through the 28-gene [facility] Hereditary Cancer Panel through [employer] found no deleterious mutations.  Additionally, no variants of uncertain significance (VUSes) were found.  The [facility] Hereditary Cancer Panel offered by [employer] includes sequencing and deletion/duplication testing of the following 28 genes: APC, ATM, BARD1, BMPR1A, BRCA1, BRCA2, BRIP1, CHD1, CDK4, CDKN2A, CHEK2, EPCAM (large rearrangement only), GREM1/SCG5, MLH1, MSH2, MSH6, MUTYH, NBN, PALB2, PMS2, POLD1, POLE, PTEN, RAD51C, RAD51D, SMAD4, STK11, and TP53. Tumor BRACAnalysis and Genomic Instability Status Testing was also performed through [employer] and was negative.  The test report will be scanned into EPIC and will be located under the Molecular Pathology section of the Results Review tab.   We discussed with Ms. Dickman that since the current genetic testing is not perfect, it is possible there may be a gene mutation in one of these genes that current testing cannot detect, but that chance is small. We also discussed, that it is possible that another gene that has not yet been discovered, or that we have not yet tested, is responsible for the cancer diagnoses in the  family, and it is, therefore, important to remain in touch with cancer genetics in the future so that we can continue to offer Ms. Rudd the most up-to-date genetic testing.    CANCER SCREENING RECOMMENDATIONS: We still do not have an explanation for the personal and family history of cancer.  However, this result seems to be reassuring and indicates that Ms. Kuri likely does not have an increased risk for a future cancer due to a mutation in one of these genes. This normal test also suggests that Ms. Hipp's cancer was most likely not due to an inherited predisposition associated with one of these genes.  Most cancers happen by chance and this negative test suggests that her cancer falls into this category.  We, therefore, recommended she continue to follow the cancer management and screening guidelines provided by her oncology and primary healthcare providers.   RECOMMENDATIONS FOR FAMILY MEMBERS: Men and women in this family might be at some increased risk of developing cancer, over the general population risk, simply due to the family history of cancer. We recommended women in this family have a yearly mammogram beginning at age 51, or 7 years younger than the earliest onset of cancer, an annual clinical breast exam, and perform monthly breast self-exams. Women in this family should also have a gynecological exam as recommended by their primary provider. Men in the family should discuss prostate cancer screening with their primary providers.  All family members should have a colonoscopy by age 63.  FOLLOW-UP: Lastly, we discussed with Ms. Cookston that cancer genetics is a rapidly advancing field and it is possible that new genetic tests will be appropriate for her and/or her family members in the future. We encouraged her to remain in  contact with cancer genetics on an annual basis so we can update her personal and family histories and let her know of advances in cancer genetics that may benefit this family.    Our contact number was provided. Ms. Edmundson questions were answered to her satisfaction, and she knows she is welcome to call us at anytime with additional questions or concerns.   Jeanine Luz, MS, [facility] Certified Genetic Counselor [email or web address] Phone: [phone]

## 2016-02-26 ENCOUNTER — Ambulatory Visit (HOSPITAL_BASED_OUTPATIENT_CLINIC_OR_DEPARTMENT_OTHER): Payer: Medicare Other | Admitting: Oncology

## 2016-02-26 ENCOUNTER — Other Ambulatory Visit (HOSPITAL_BASED_OUTPATIENT_CLINIC_OR_DEPARTMENT_OTHER): Payer: Medicare Other

## 2016-02-26 ENCOUNTER — Ambulatory Visit (HOSPITAL_BASED_OUTPATIENT_CLINIC_OR_DEPARTMENT_OTHER): Payer: Medicare Other

## 2016-02-26 VITALS — BP 134/70 | HR 84 | Temp 97.5°F | Resp 18 | Ht 67.0 in | Wt 143.5 lb

## 2016-02-26 DIAGNOSIS — C7802 Secondary malignant neoplasm of left lung: Secondary | ICD-10-CM

## 2016-02-26 DIAGNOSIS — C569 Malignant neoplasm of unspecified ovary: Secondary | ICD-10-CM

## 2016-02-26 DIAGNOSIS — T451X5A Adverse effect of antineoplastic and immunosuppressive drugs, initial encounter: Secondary | ICD-10-CM

## 2016-02-26 DIAGNOSIS — C801 Malignant (primary) neoplasm, unspecified: Secondary | ICD-10-CM

## 2016-02-26 DIAGNOSIS — D6481 Anemia due to antineoplastic chemotherapy: Secondary | ICD-10-CM

## 2016-02-26 DIAGNOSIS — D701 Agranulocytosis secondary to cancer chemotherapy: Secondary | ICD-10-CM

## 2016-02-26 DIAGNOSIS — C7801 Secondary malignant neoplasm of right lung: Secondary | ICD-10-CM

## 2016-02-26 DIAGNOSIS — C786 Secondary malignant neoplasm of retroperitoneum and peritoneum: Secondary | ICD-10-CM | POA: Diagnosis not present

## 2016-02-26 DIAGNOSIS — Z95828 Presence of other vascular implants and grafts: Secondary | ICD-10-CM

## 2016-02-26 DIAGNOSIS — C482 Malignant neoplasm of peritoneum, unspecified: Secondary | ICD-10-CM

## 2016-02-26 DIAGNOSIS — E119 Type 2 diabetes mellitus without complications: Secondary | ICD-10-CM

## 2016-02-26 LAB — CBC WITH DIFFERENTIAL/PLATELET
BASO%: 0.5 % (ref 0.0–2.0)
Basophils Absolute: 0 10*3/uL (ref 0.0–0.1)
EOS%: 1.8 % (ref 0.0–7.0)
Eosinophils Absolute: 0.1 10*3/uL (ref 0.0–0.5)
HEMATOCRIT: 25.7 % — AB (ref 34.8–46.6)
HEMOGLOBIN: 8.2 g/dL — AB (ref 11.6–15.9)
LYMPH#: 1.8 10*3/uL (ref 0.9–3.3)
LYMPH%: 31 % (ref 14.0–49.7)
MCH: 23 pg — ABNORMAL LOW (ref 25.1–34.0)
MCHC: 32 g/dL (ref 31.5–36.0)
MCV: 71.7 fL — ABNORMAL LOW (ref 79.5–101.0)
MONO#: 0.7 10*3/uL (ref 0.1–0.9)
MONO%: 11.4 % (ref 0.0–14.0)
NEUT%: 55.3 % (ref 38.4–76.8)
NEUTROS ABS: 3.2 10*3/uL (ref 1.5–6.5)
Platelets: 239 10*3/uL (ref 145–400)
RBC: 3.58 10*6/uL — ABNORMAL LOW (ref 3.70–5.45)
RDW: 20 % — AB (ref 11.2–14.5)
WBC: 5.7 10*3/uL (ref 3.9–10.3)

## 2016-02-26 LAB — COMPREHENSIVE METABOLIC PANEL
ALT: 13 U/L (ref 0–55)
AST: 18 U/L (ref 5–34)
Albumin: 3.5 g/dL (ref 3.5–5.0)
Alkaline Phosphatase: 87 U/L (ref 40–150)
Anion Gap: 10 mEq/L (ref 3–11)
BUN: 15.2 mg/dL (ref 7.0–26.0)
CALCIUM: 9.4 mg/dL (ref 8.4–10.4)
CHLORIDE: 104 meq/L (ref 98–109)
CO2: 24 mEq/L (ref 22–29)
CREATININE: 0.8 mg/dL (ref 0.6–1.1)
EGFR: 83 mL/min/{1.73_m2} — ABNORMAL LOW (ref >90)
Glucose: 164 mg/dl — ABNORMAL HIGH (ref 70–140)
Potassium: 4 mEq/L (ref 3.5–5.1)
Sodium: 138 mEq/L (ref 136–145)
TOTAL PROTEIN: 6.9 g/dL (ref 6.4–8.3)
Total Bilirubin: <0.22 mg/dL (ref 0.20–1.20)

## 2016-02-26 MED ORDER — HEPARIN SOD (PORK) LOCK FLUSH 100 UNIT/ML IV SOLN
500.0000 [IU] | Freq: Once | INTRAVENOUS | Status: AC | PRN
  Administered 2016-02-26: 500 [IU] via INTRAVENOUS
  Filled 2016-02-26: qty 5

## 2016-02-26 MED ORDER — SODIUM CHLORIDE 0.9% FLUSH
10.0000 mL | INTRAVENOUS | Status: DC | PRN
  Administered 2016-02-26: 10 mL via INTRAVENOUS
  Filled 2016-02-26: qty 10

## 2016-02-26 NOTE — Progress Notes (Signed)
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 She continues chemotherapy with carboplatin and taxol as initial intervention, due cycle 5 on 03-04-16.  CT CAP 02-04-16, after cycle 3,  shows improvement thruout. She saw Dr Duard Brady 02-18-16, who agrees with plan to continue thru cycle 6, then will see her again 04-28-16, with repeat imaging prior to that visit.    Patient has been more fatigued overall since cycle 4 on 02-13-16, ongoing, tho she enjoyed Christmas. She is fatigued with regular home activities and with walking into office today. She has had no nausea, appetite better, bowels moving adequately. She has seen no bleeding. She has no significant peripheral neuropathy. She denies SOB at rest, cough or chest pain, wheezing, fever. She uses ativan at hs to help sleep, but will try without this if not needed. She is taking oral iron.  No pain, no increased abdominal distension, bladder ok, no LE swelling Remainder of 10 point Review of Systems negative.  Flu vaccine 10-28-15 PAC placed by IR 01-01-16 Genetics testing normal 02-04-16 (298 gene myRisk panel by [facility]). Tumor BRACAnalysis and Genomic Instability Status Testing was also performed through [facility] and was negative. CA 125 on 12-05-15 469 and 739 on 12-25-15.     ONCOLOGIC HISTORY Patient developed some mild GI symptoms in past 3-4 months, initially thought related to the diabetes medications. Symptoms of constipation, early satiety and abdominal fullness and general abdominal soreness persisted, with CT AP 11-07-15 found bibasilar pulmonary nodules and small right pleural effusion; uterus surgically absent, right adnexal solid and cystic mass5.8 x 3.7 x 4.7 cm; predominantly  cystic mass with smaller soft tissue component in left adnexa6.1 x 2.8 x 7.0 cm; diffuse omental tumor infiltration up to 3 cm thick;tumor deposits are seen in the pericolic gutters bilaterally; large tumor deposit at the cul de sac at least 4.1 cm diameter; probable tumor deposits along small bowel loops and colon in pelvis. CA 125 on 11-12-15 was 301.8, with CEA 8.3. She was seen by Dr Duard Brady on 11-14-15, with omental biopsy 11-14-15 ([id number]) adenocarcinoma consistent with metastatic ovarian. Recommendation was for neoadjuvant chemotherapy with consideration of surgery by gyn oncology after ~ cycle 3 depending on response Cycle 1 carbo taxol given 12-12-15. CA 125 on 12-05-15 was 469 and on 12-25-15 was 739. She was neutropenic with ANC 0.8 on day 14 cycle 1. CT chest 12-10-15 showed multiple bilateral pulmonary mets and right pleural effusion, tho not enough pleural fluid to tap.  Genetics testing 02-04-16 with 28-gene myRisk Hereditary Cancer Panel through [facility] found no deleterious mutations  APC, ATM, BARD1, BMPR1A, BRCA1, BRCA2, BRIP1, CHD1, CDK4, CDKN2A, CHEK2, EPCAM (large rearrangement only), GREM1/SCG5, MLH1, MSH2, MSH6, MUTYH, NBN, PALB2, PMS2, POLD1, POLE, PTEN, RAD51C, RAD51D, SMAD4, STK11, and TP53. No VUS. Tumor BRACAnalysis and Genomic Instability Status Testing was also performed through [facility] and was negative. CT CAP 02-04-16 after 3 cycles chemo, compared with CT AP done a month prior to first chemo, shows some improvement multiple pulmonary mets with decrease in low left cervical/ supraclavicular adenopathy and decrease in small right pleural effusion, improved omental and peritoneal mets, stable right adnexal mass and increase in left adnexal cystic lesion.    Objective:  Vital signs in last 24 hours:  BP  134/70 (BP Location: Left Arm, Patient Position: Sitting)   Pulse 84   Temp 97.5 F (36.4 C) (Oral)   Resp 18   Ht 5\' 7"  (1.702  m)   Wt 143 lb 8 oz (65.1 kg)   SpO2 100%   BMI 22.48 kg/m  Weight up 3 lbs Alert, oriented and appropriate, not in any acute distress, very pleasant as always. Ambulatory without assistance, able to get on and off exam table. Respirations not labored RA with this activity.  Alopecia  HEENT:PERRL, sclerae not icteric. Oral mucosa moist without lesions, posterior pharynx clear.  Neck supple. No JVD.  Lymphatics:no cervical,supraclavicular or inguinal adenopathy Resp: clear to auscultation bilaterally and normal percussion bilaterally Cardio: regular rate and rhythm. No gallop. GI: soft, nontender, not distended, no mass or organomegaly. Normally active bowel sounds. Surgical incision not remarkable. Musculoskeletal/ Extremities: LE without pitting edema, cords, tenderness Neuro: no significant peripheral neuropathy. Otherwise nonfocal. PSYCH appropriate mood and affect Skin without rash, ecchymosis, petechiae. Nailbeds and mucous membranes pale.  Portacath-without erythema or tenderness  Lab Results:  Results for orders placed or performed in visit on 02/26/16  CBC with Differential  Result Value Ref Range   WBC 5.7 3.9 - 10.3 10e3/uL   NEUT# 3.2 1.5 - 6.5 10e3/uL   HGB 8.2 (L) 11.6 - 15.9 g/dL   HCT 02/28/16 (L) 57.8 - 97.8 %   Platelets 239 145 - 400 10e3/uL   MCV 71.7 (L) 79.5 - 101.0 fL   MCH 23.0 (L) 25.1 - 34.0 pg   MCHC 32.0 31.5 - 36.0 g/dL   RBC 47.8 (L) 4.12 - 8.20 10e6/uL   RDW 20.0 (H) 11.2 - 14.5 %   lymph# 1.8 0.9 - 3.3 10e3/uL   MONO# 0.7 0.1 - 0.9 10e3/uL   Eosinophils Absolute 0.1 0.0 - 0.5 10e3/uL   Basophils Absolute 0.0 0.0 - 0.1 10e3/uL   NEUT% 55.3 38.4 - 76.8 %   LYMPH% 31.0 14.0 - 49.7 %   MONO% 11.4 0.0 - 14.0 %   EOS% 1.8 0.0 - 7.0 %   BASO% 0.5 0.0 - 2.0 %  Comprehensive metabolic panel  Result Value Ref Range   Sodium 138 136 - 145 mEq/L   Potassium 4.0 3.5 - 5.1 mEq/L   Chloride 104 98 - 109 mEq/L   CO2 24 22 - 29 mEq/L   Glucose 164 (H) 70 -  140 mg/dl   BUN 8.13 7.0 - 88.7 mg/dL   Creatinine 0.8 0.6 - 1.1 mg/dL   Total Bilirubin 19.5 0.20 - 1.20 mg/dL   Alkaline Phosphatase 87 40 - 150 U/L   AST 18 5 - 34 U/L   ALT 13 0 - 55 U/L   Total Protein 6.9 6.4 - 8.3 g/dL   Albumin 3.5 3.5 - 5.0 g/dL   Calcium 9.4 8.4 - <9.74 mg/dL   Anion Gap 10 3 - 11 mEq/L   EGFR 83 (L) >90 ml/min/1.73 m2     Studies/Results: CT CHEST, ABDOMEN, AND PELVIS WITH CONTRAST  COMPARISON:  Chest CT of 12/09/2015. Abdominal pelvic CT of 11/07/2015.  FINDINGS: CT CHEST FINDINGS  Cardiovascular: Right sided Port-A-Cath which terminates the superior caval/ atrial junction. Aortic and branch vessel atherosclerosis. Tortuous thoracic aorta. Normal heart size, without pericardial effusion. No central pulmonary embolism, on this non-dedicated study.  Mediastinum/Nodes: Left supraclavicular nodes measure maximally 7 mm on image 4/series 2 versus 10 mm on the prior exam (when remeasured). No mediastinal adenopathy. Right hilar nodal tissue is unchanged  at 11 mm on image 24/ series 2. Not pathologic by size criteria. Dilated thoracic duct.  Lungs/Pleura: Decrease in right pleural fluid, small. Bilateral pulmonary nodules, with interval development of cavitation. A newly cavitary 11 x 10 mm right apical nodule on image 25/ series 7 measured 12 x 10 mm previously.  A more inferior right upper lobe 10 mm nodule on image 34/ series 7 is newly cavitary but similar in size.  Superior segment left lower lobe index nodule measures 5 mm on image 40/series 7 versus 7 mm on the prior exam (when remeasured).  Musculoskeletal: No acute osseous abnormality.  CT ABDOMEN PELVIS FINDINGS  Hepatobiliary: Suspect a right hepatic lobe 10 mm hemangioma on image 44/series 2. Vague hypo attenuating right hepatic lobe 9 mm lesion on image 62/ series 2 is unchanged and indeterminate. Normal gallbladder, without biliary ductal dilatation.  Pancreas:  Hypoattenuation involving the pancreatic body/ tail junction is ill-defined and results in upstream duct dilatation. Difficult to quantify secondary to ill-defined morphology. Measures on the order of 2.6 x 2.5 cm on image 55/series 2. Slightly more conspicuous today than at 1.9 x 0.8 cm on the prior. No acute pancreatitis.  Spleen: Normal in size, without focal abnormality.  Adrenals/Urinary Tract: Normal adrenal glands. Normal kidneys, without hydronephrosis. Normal urinary bladder.  Stomach/Bowel: Normal stomach, without wall thickening. Colonic stool burden suggests constipation. Normal terminal ileum. Normal small bowel.  Vascular/Lymphatic: Aortic and branch vessel atherosclerosis. No abdominal adenopathy. Suspect an enlarged right common iliac node at 11 mm on image 82/series 2. Compare 12 mm on image 46/series 2 of the prior exam (when remeasured).  Reproductive: Hysterectomy. Septated cystic Lesion in the left hemipelvis measures 7.6 x 4.4 cm today versus 6.1 x 2.8 cm on the prior.  Enlarged right ovary with solid and cystic components measures 5.5 x 3.5 cm today versus 5.8 x 3.7 cm on the prior. This is felt to be similar.  Other: No significant free fluid. Persistent omental/peritoneal nodularity. A right-sided omental mass measures 2.2 x 3.6 cm on image 76/series 2. Compare 4.8 x 2.3 cm on the prior exam (when remeasured). Inferior omental caking within the left hemipelvis measures 1.8 cm on image 89/ series 2 versus 2.9 cm at the same level on the prior. No ascites.  Musculoskeletal: Degenerative disc disease at the lumbosacral junction.  IMPRESSION: CT CHEST IMPRESSION  1. Slight improvement in pulmonary metastasis. 2. Decrease in low left cervical/supraclavicular adenopathy, favoring response to therapy. 3. Decrease in small right pleural effusion.  CT ABDOMEN AND PELVIS IMPRESSION  1. Improvement in omental/peritoneal metastasis. 2.  Enlarged cystic lesion within the left hemipelvis could represent a primary ovarian malignancy or metastasis. Right ovarian soft tissue fullness and heterogeneity is not significantly changed. 3. Pancreatic body/tail junction hypoattenuation is difficult to quantify secondary to morphology. However, felt to be increased in conspicuity today. Considerations include metastatic disease or a somewhat atypical distribution of pancreatic adenocarcinoma with metastasis. 4. Slight improvement in right common iliac high adenopathy. 5. Aortic atherosclerosis.  Medications: I have reviewed the patient's current medications.  DISCUSSION Patient and daughter are in agreement with plan to continue this chemotherapy for total 6 cycles, then repeat scans. Although they were very focused on surgery initially, they have better understanding now that treatment is in attempt to improve and control disease, and that any surgery would not be curative. They are very appreciative of Dr Elenora Gamma follow up. Anemia is more symptomatic now, cumulative from the chemo and other problems. We have  discussed PRBCs, 1 unit can be given 02-27-16; patient agrees. .   Plan cycle 5 carbo taxol on 03-04-16 as long as ANC >=1.5 and plt >=100k, with granix 1-8 and 1-9, using carbo AUC =6 and taxol at 175 mg/m2. I will see her piror to cycle 6 due 03-25-16.    Assessment/Plan:   1. 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 Will give cycle 5 on 03-04-16 (parameters above) with granix. Expect repeat scans after cycle 6, shortly prior to seeing Dr Alycia Rossetti next on 04-28-16.   No genetics abnormalities identified 01-2016. 2.Symptomatic anemia primarily chemo, iron not extremely low 02-13-16. Will give 1 unit PRBCs on  02-27-16.  3.Diabetes on metformin and SS insulin managed byDr Sharlet Salina. SS insulin with chemo infusions due to decadron.   4.Constipation improved with miralax. 5.post hysterectomy without oophorectomy ~ 1970 6.flu vaccine 10-28-15 7.work noise exposure in past with decreased hearing 8.minimal past tobacco, DCd 1977. Second hand tobacco exposure. 9.Living WIll and HCPOA in place.  10.Social: daughter with 36 yo twins moved from [place] to be with patient; daughter very supportive. Patient's autistic son also in the home. 11..environmental allergies: better with flonase to ongoing claritin 12.PAC placed by IR 01-01-16  All questions answered and they know to call if concerns prior to next appointments. Transfusion orders placed, chemo and granix orders confirmed. Time spent 25 min including >50% counseling and coordination of care. Route Dr Sharlet Salina   Evlyn Clines, MD   02/26/2016, 8:28 PM

## 2016-02-27 ENCOUNTER — Ambulatory Visit (HOSPITAL_BASED_OUTPATIENT_CLINIC_OR_DEPARTMENT_OTHER): Payer: Medicare Other

## 2016-02-27 ENCOUNTER — Ambulatory Visit: Payer: Medicare Other

## 2016-02-27 ENCOUNTER — Ambulatory Visit (HOSPITAL_COMMUNITY)
Admission: RE | Admit: 2016-02-27 | Discharge: 2016-02-27 | Disposition: A | Discharge status: Home / Self Care | Payer: Medicare Other | Source: Ambulatory Visit | Attending: Oncology | Admitting: Oncology

## 2016-02-27 DIAGNOSIS — D6481 Anemia due to antineoplastic chemotherapy: Secondary | ICD-10-CM

## 2016-02-27 DIAGNOSIS — T451X5A Adverse effect of antineoplastic and immunosuppressive drugs, initial encounter: Principal | ICD-10-CM

## 2016-02-27 DIAGNOSIS — C786 Secondary malignant neoplasm of retroperitoneum and peritoneum: Secondary | ICD-10-CM

## 2016-02-27 DIAGNOSIS — C801 Malignant (primary) neoplasm, unspecified: Secondary | ICD-10-CM

## 2016-02-27 DIAGNOSIS — Z95828 Presence of other vascular implants and grafts: Secondary | ICD-10-CM

## 2016-02-27 LAB — PREPARE RBC (CROSSMATCH)

## 2016-02-27 LAB — ABO/RH: ABO/RH(D): O POS

## 2016-02-27 MED ORDER — SODIUM CHLORIDE 0.9% FLUSH
10.0000 mL | INTRAVENOUS | Status: AC | PRN
  Administered 2016-02-27: 10 mL
  Filled 2016-02-27: qty 10

## 2016-02-27 MED ORDER — ALTEPLASE 2 MG IJ SOLR
2.0000 mg | Freq: Once | INTRAMUSCULAR | Status: DC | PRN
  Filled 2016-02-27: qty 2

## 2016-02-27 MED ORDER — HEPARIN SOD (PORK) LOCK FLUSH 100 UNIT/ML IV SOLN
500.0000 [IU] | Freq: Every day | INTRAVENOUS | Status: AC | PRN
  Administered 2016-02-27: 500 [IU]
  Filled 2016-02-27: qty 5

## 2016-02-27 MED ORDER — SODIUM CHLORIDE 0.9% FLUSH
3.0000 mL | INTRAVENOUS | Status: DC | PRN
  Filled 2016-02-27: qty 10

## 2016-02-27 MED ORDER — ACETAMINOPHEN 325 MG PO TABS
325.0000 mg | ORAL_TABLET | Freq: Once | ORAL | Status: AC
  Administered 2016-02-27: 325 mg via ORAL

## 2016-02-27 MED ORDER — ACETAMINOPHEN 325 MG PO TABS
ORAL_TABLET | ORAL | Status: AC
  Filled 2016-02-27: qty 1

## 2016-02-27 MED ORDER — SODIUM CHLORIDE 0.9 % IV SOLN
250.0000 mL | Freq: Once | INTRAVENOUS | Status: AC
  Administered 2016-02-27: 250 mL via INTRAVENOUS

## 2016-02-27 NOTE — Patient Instructions (Signed)

## 2016-02-27 NOTE — Patient Instructions (Signed)

## 2016-02-28 ENCOUNTER — Encounter: Payer: Self-pay | Admitting: Oncology

## 2016-02-28 DIAGNOSIS — C569 Malignant neoplasm of unspecified ovary: Secondary | ICD-10-CM | POA: Insufficient documentation

## 2016-03-02 LAB — TYPE AND SCREEN
ABO/RH(D): O POS
Antibody Screen: NEGATIVE
Unit division: 0

## 2016-03-04 ENCOUNTER — Ambulatory Visit (HOSPITAL_BASED_OUTPATIENT_CLINIC_OR_DEPARTMENT_OTHER): Payer: Medicare Other

## 2016-03-04 ENCOUNTER — Ambulatory Visit: Payer: Medicare Other

## 2016-03-04 ENCOUNTER — Other Ambulatory Visit (HOSPITAL_BASED_OUTPATIENT_CLINIC_OR_DEPARTMENT_OTHER): Payer: Medicare Other

## 2016-03-04 VITALS — BP 138/73 | HR 99 | Temp 97.8°F | Resp 18

## 2016-03-04 DIAGNOSIS — C482 Malignant neoplasm of peritoneum, unspecified: Secondary | ICD-10-CM

## 2016-03-04 DIAGNOSIS — C7801 Secondary malignant neoplasm of right lung: Secondary | ICD-10-CM

## 2016-03-04 DIAGNOSIS — C786 Secondary malignant neoplasm of retroperitoneum and peritoneum: Secondary | ICD-10-CM

## 2016-03-04 DIAGNOSIS — C7802 Secondary malignant neoplasm of left lung: Secondary | ICD-10-CM

## 2016-03-04 DIAGNOSIS — Z5111 Encounter for antineoplastic chemotherapy: Secondary | ICD-10-CM | POA: Diagnosis not present

## 2016-03-04 DIAGNOSIS — C801 Malignant (primary) neoplasm, unspecified: Secondary | ICD-10-CM

## 2016-03-04 DIAGNOSIS — Z95828 Presence of other vascular implants and grafts: Secondary | ICD-10-CM

## 2016-03-04 DIAGNOSIS — E119 Type 2 diabetes mellitus without complications: Secondary | ICD-10-CM

## 2016-03-04 LAB — COMPREHENSIVE METABOLIC PANEL
ALBUMIN: 3.9 g/dL (ref 3.5–5.0)
ALK PHOS: 80 U/L (ref 40–150)
ALT: 12 U/L (ref 0–55)
AST: 16 U/L (ref 5–34)
Anion Gap: 14 mEq/L — ABNORMAL HIGH (ref 3–11)
BILIRUBIN TOTAL: 0.39 mg/dL (ref 0.20–1.20)
BUN: 26.7 mg/dL — ABNORMAL HIGH (ref 7.0–26.0)
CALCIUM: 10.1 mg/dL (ref 8.4–10.4)
CO2: 21 mEq/L — ABNORMAL LOW (ref 22–29)
Chloride: 101 mEq/L (ref 98–109)
Creatinine: 1 mg/dL (ref 0.6–1.1)
EGFR: 59 mL/min/{1.73_m2} — AB (ref >90)
GLUCOSE: 340 mg/dL — AB (ref 70–140)
Potassium: 4.2 mEq/L (ref 3.5–5.1)
SODIUM: 136 meq/L (ref 136–145)
TOTAL PROTEIN: 7.3 g/dL (ref 6.4–8.3)

## 2016-03-04 LAB — CBC WITH DIFFERENTIAL/PLATELET
BASO%: 0.1 % (ref 0.0–2.0)
Basophils Absolute: 0 10*3/uL (ref 0.0–0.1)
EOS ABS: 0 10*3/uL (ref 0.0–0.5)
EOS%: 0 % (ref 0.0–7.0)
HCT: 31.6 % — ABNORMAL LOW (ref 34.8–46.6)
HEMOGLOBIN: 10.1 g/dL — AB (ref 11.6–15.9)
LYMPH%: 7.3 % — ABNORMAL LOW (ref 14.0–49.7)
MCH: 23.6 pg — ABNORMAL LOW (ref 25.1–34.0)
MCHC: 32 g/dL (ref 31.5–36.0)
MCV: 73.8 fL — AB (ref 79.5–101.0)
MONO#: 0 10*3/uL — ABNORMAL LOW (ref 0.1–0.9)
MONO%: 0.2 % (ref 0.0–14.0)
NEUT%: 92.4 % — ABNORMAL HIGH (ref 38.4–76.8)
NEUTROS ABS: 8.8 10*3/uL — AB (ref 1.5–6.5)
Platelets: 222 10*3/uL (ref 145–400)
RBC: 4.28 10*6/uL (ref 3.70–5.45)
RDW: 20.6 % — AB (ref 11.2–14.5)
WBC: 9.6 10*3/uL (ref 3.9–10.3)
lymph#: 0.7 10*3/uL — ABNORMAL LOW (ref 0.9–3.3)

## 2016-03-04 MED ORDER — SODIUM CHLORIDE 0.9% FLUSH
10.0000 mL | INTRAVENOUS | Status: DC | PRN
  Administered 2016-03-04: 10 mL via INTRAVENOUS
  Filled 2016-03-04: qty 10

## 2016-03-04 MED ORDER — SODIUM CHLORIDE 0.9% FLUSH
10.0000 mL | INTRAVENOUS | Status: DC | PRN
  Administered 2016-03-04: 10 mL
  Filled 2016-03-04: qty 10

## 2016-03-04 MED ORDER — ONDANSETRON HCL 8 MG PO TABS
8.0000 mg | ORAL_TABLET | Freq: Once | ORAL | Status: AC
  Administered 2016-03-04: 8 mg via ORAL

## 2016-03-04 MED ORDER — CARBOPLATIN CHEMO INJECTION 600 MG/60ML
454.2000 mg | Freq: Once | INTRAVENOUS | Status: AC
  Administered 2016-03-04: 450 mg via INTRAVENOUS
  Filled 2016-03-04: qty 45

## 2016-03-04 MED ORDER — SODIUM CHLORIDE 0.9 % IV SOLN
Freq: Once | INTRAVENOUS | Status: AC
  Administered 2016-03-04: 11:00:00 via INTRAVENOUS

## 2016-03-04 MED ORDER — HEPARIN SOD (PORK) LOCK FLUSH 100 UNIT/ML IV SOLN
500.0000 [IU] | Freq: Once | INTRAVENOUS | Status: AC | PRN
  Administered 2016-03-04: 500 [IU]
  Filled 2016-03-04: qty 5

## 2016-03-04 MED ORDER — FAMOTIDINE IN NACL 20-0.9 MG/50ML-% IV SOLN
INTRAVENOUS | Status: AC
  Filled 2016-03-04: qty 50

## 2016-03-04 MED ORDER — DIPHENHYDRAMINE HCL 50 MG/ML IJ SOLN
25.0000 mg | Freq: Once | INTRAMUSCULAR | Status: AC
  Administered 2016-03-04: 25 mg via INTRAVENOUS

## 2016-03-04 MED ORDER — SODIUM CHLORIDE 0.9 % IV SOLN
175.0000 mg/m2 | Freq: Once | INTRAVENOUS | Status: AC
  Administered 2016-03-04: 318 mg via INTRAVENOUS
  Filled 2016-03-04: qty 53

## 2016-03-04 MED ORDER — FAMOTIDINE IN NACL 20-0.9 MG/50ML-% IV SOLN
20.0000 mg | Freq: Once | INTRAVENOUS | Status: AC
  Administered 2016-03-04: 20 mg via INTRAVENOUS

## 2016-03-04 MED ORDER — SODIUM CHLORIDE 0.9 % IV SOLN
Freq: Once | INTRAVENOUS | Status: AC
  Administered 2016-03-04: 11:00:00 via INTRAVENOUS
  Filled 2016-03-04: qty 5

## 2016-03-04 MED ORDER — INSULIN REGULAR HUMAN 100 UNIT/ML IJ SOLN
2.0000 [IU] | Freq: Once | INTRAMUSCULAR | Status: AC | PRN
Start: 2016-03-04 — End: 2016-03-04
  Administered 2016-03-04: 8 [IU] via SUBCUTANEOUS
  Filled 2016-03-04: qty 0.08

## 2016-03-04 MED ORDER — ONDANSETRON HCL 8 MG PO TABS
ORAL_TABLET | ORAL | Status: AC
  Filled 2016-03-04: qty 1

## 2016-03-04 MED ORDER — DIPHENHYDRAMINE HCL 50 MG/ML IJ SOLN
INTRAMUSCULAR | Status: AC
  Filled 2016-03-04: qty 1

## 2016-03-04 NOTE — Patient Instructions (Signed)
[facility] Discharge Instructions for Patients Receiving Chemotherapy  Today you received the following chemotherapy agents Taxol and Carboplatin. To help prevent nausea and vomiting after your treatment, we encourage you to take your nausea medication as directed.  If you develop nausea and vomiting that is not controlled by your nausea medication, call the clinic.   BELOW ARE SYMPTOMS THAT SHOULD BE REPORTED IMMEDIATELY:  *FEVER GREATER THAN 100.5 F  *CHILLS WITH OR WITHOUT FEVER  NAUSEA AND VOMITING THAT IS NOT CONTROLLED WITH YOUR NAUSEA MEDICATION  *UNUSUAL SHORTNESS OF BREATH  *UNUSUAL BRUISING OR BLEEDING  TENDERNESS IN MOUTH AND THROAT WITH OR WITHOUT PRESENCE OF ULCERS  *URINARY PROBLEMS  *BOWEL PROBLEMS  UNUSUAL RASH Items with * indicate a potential emergency and should be followed up as soon as possible.  Feel free to call the clinic you have any questions or concerns. The clinic phone number is [phone].  Please show the CHEMO ALERT CARD at check-in to the Emergency Department and triage nurse.    

## 2016-03-05 LAB — CA 125: Cancer Antigen (CA) 125: 180.9 U/mL — ABNORMAL HIGH (ref 0.0–38.1)

## 2016-03-07 ENCOUNTER — Telehealth: Payer: Self-pay | Admitting: Hematology and Oncology

## 2016-03-07 NOTE — Telephone Encounter (Signed)
S/w pt and pt's daughter. Gave appt for 2/15 @ 1.30pm with NG. Pt will collect a new calendar when she comes tomorrow.

## 2016-03-08 ENCOUNTER — Ambulatory Visit (HOSPITAL_BASED_OUTPATIENT_CLINIC_OR_DEPARTMENT_OTHER): Payer: Medicare Other

## 2016-03-08 ENCOUNTER — Telehealth: Payer: Self-pay | Admitting: *Deleted

## 2016-03-08 VITALS — BP 113/69 | HR 100 | Temp 98.5°F | Resp 20

## 2016-03-08 DIAGNOSIS — Z95828 Presence of other vascular implants and grafts: Secondary | ICD-10-CM

## 2016-03-08 DIAGNOSIS — C7802 Secondary malignant neoplasm of left lung: Secondary | ICD-10-CM | POA: Diagnosis not present

## 2016-03-08 DIAGNOSIS — C7801 Secondary malignant neoplasm of right lung: Secondary | ICD-10-CM

## 2016-03-08 DIAGNOSIS — Z5189 Encounter for other specified aftercare: Secondary | ICD-10-CM | POA: Diagnosis not present

## 2016-03-08 DIAGNOSIS — C801 Malignant (primary) neoplasm, unspecified: Secondary | ICD-10-CM | POA: Diagnosis not present

## 2016-03-08 DIAGNOSIS — C786 Secondary malignant neoplasm of retroperitoneum and peritoneum: Secondary | ICD-10-CM | POA: Diagnosis not present

## 2016-03-08 MED ORDER — TBO-FILGRASTIM 300 MCG/0.5ML ~~LOC~~ SOSY
300.0000 ug | PREFILLED_SYRINGE | Freq: Once | SUBCUTANEOUS | Status: AC
  Administered 2016-03-08: 300 ug via SUBCUTANEOUS
  Filled 2016-03-08: qty 0.5

## 2016-03-08 NOTE — Telephone Encounter (Signed)
-----   Message from Baruch Merl, RN sent at 03/08/2016  4:12 PM EST -----   ----- Message ----- From: Gordy Levan, MD Sent: 03/08/2016  11:01 AM To: Baruch Merl, RN, Janace Hoard, RN  Labs seen and need follow up     When possible, please let her know marker continues to improve, now 180. Continue treatment as planned

## 2016-03-08 NOTE — Telephone Encounter (Signed)
Informed pt and daughter of CA125 result. Now 180, continue treatment as planned. Daughter voiced understanding, appreciation for call.

## 2016-03-08 NOTE — Patient Instructions (Signed)

## 2016-03-09 ENCOUNTER — Ambulatory Visit (HOSPITAL_BASED_OUTPATIENT_CLINIC_OR_DEPARTMENT_OTHER): Payer: Medicare Other

## 2016-03-09 VITALS — BP 114/70 | HR 100 | Temp 97.8°F | Resp 20

## 2016-03-09 DIAGNOSIS — Z95828 Presence of other vascular implants and grafts: Secondary | ICD-10-CM

## 2016-03-09 DIAGNOSIS — C801 Malignant (primary) neoplasm, unspecified: Secondary | ICD-10-CM

## 2016-03-09 DIAGNOSIS — C786 Secondary malignant neoplasm of retroperitoneum and peritoneum: Secondary | ICD-10-CM | POA: Diagnosis not present

## 2016-03-09 DIAGNOSIS — Z5189 Encounter for other specified aftercare: Secondary | ICD-10-CM

## 2016-03-09 DIAGNOSIS — C7802 Secondary malignant neoplasm of left lung: Secondary | ICD-10-CM

## 2016-03-09 DIAGNOSIS — C7801 Secondary malignant neoplasm of right lung: Secondary | ICD-10-CM

## 2016-03-09 MED ORDER — TBO-FILGRASTIM 300 MCG/0.5ML ~~LOC~~ SOSY
300.0000 ug | PREFILLED_SYRINGE | Freq: Once | SUBCUTANEOUS | Status: AC
  Administered 2016-03-09: 300 ug via SUBCUTANEOUS
  Filled 2016-03-09: qty 0.5

## 2016-03-09 NOTE — Patient Instructions (Signed)
Tbo-Filgrastim injection What is this medicine? TBO-FILGRASTIM (T B O fil GRA stim) is a granulocyte colony-stimulating factor that stimulates the growth of neutrophils, a type of white blood cell important in the body's fight against infection. It is used to reduce the incidence of fever and infection in patients with certain types of cancer who are receiving chemotherapy that affects the bone marrow. COMMON BRAND NAME(S): Granix What should I tell my health care provider before I take this medicine? They need to know if you have any of these conditions: -ongoing radiation therapy -sickle cell anemia -an unusual or allergic reaction to tbo-filgrastim, filgrastim, pegfilgrastim, other medicines, foods, dyes, or preservatives -pregnant or trying to get pregnant -breast-feeding How should I use this medicine? This medicine is for injection under the skin. If you get this medicine at home, you will be taught how to prepare and give this medicine. Refer to the Instructions for Use that come with your medication packaging. Use exactly as directed. Take your medicine at regular intervals. Do not take your medicine more often than directed. It is important that you put your used needles and syringes in a special sharps container. Do not put them in a trash can. If you do not have a sharps container, call your pharmacist or healthcare provider to get one. Talk to your pediatrician regarding the use of this medicine in children. Special care may be needed. What if I miss a dose? It is important not to miss your dose. Call your doctor or health care professional if you miss a dose. What may interact with this medicine? This medicine may interact with the following medications: -medicines that may cause a release of neutrophils, such as lithium What should I watch for while using this medicine? You may need blood work done while you are taking this medicine. What side effects may I notice from receiving  this medicine? Side effects that you should report to your doctor or health care professional as soon as possible: -allergic reactions like skin rash, itching or hives, swelling of the face, lips, or tongue -shortness of breath or breathing problems -fever -pain, redness, or irritation at site where injected -pinpoint red spots on the skin -stomach or side pain, or pain at the shoulder -swelling -tiredness -trouble passing urine Side effects that usually do not require medical attention (report to your doctor or health care professional if they continue or are bothersome): -bone pain -muscle pain Where should I keep my medicine? Keep out of the reach of children. Store in a refrigerator between 2 and 8 degrees C (36 and 46 degrees F). Keep in carton to protect from light. Throw away this medicine if it is left out of the refrigerator for more than 5 consecutive days. Throw away any unused medicine after the expiration date.  2017 Elsevier/Gold Standard (2015-03-20 12:15:25)  

## 2016-03-17 ENCOUNTER — Telehealth: Payer: Self-pay | Admitting: *Deleted

## 2016-03-17 NOTE — Telephone Encounter (Signed)
FYI "Deeba Marczewski calling to reschedule for my mom.  She hates to miss appointments.  Our secondary roads are not going to be cleared, there's no way she can get there.  The timing is crucial and so close to her next chemotherapy on 03-26-2016.  She needs to see Dr. Marko Plume before next chemotherapy.  When can we reschedule?"  Instructed to expect a call from scheduler.  Scheduling message sent.  At time of this call, only opening with provider is 3:00 pm.  Scheduling message reads:   "R/S appointments to Monday 03-22-2016.  Lab at 2:15, flush at 2:30 pm and F/U visit at 3:00 pm.

## 2016-03-18 ENCOUNTER — Other Ambulatory Visit: Payer: Medicare Other

## 2016-03-18 ENCOUNTER — Ambulatory Visit: Payer: Medicare Other | Admitting: Oncology

## 2016-03-18 ENCOUNTER — Other Ambulatory Visit: Payer: Self-pay | Admitting: Oncology

## 2016-03-18 DIAGNOSIS — C569 Malignant neoplasm of unspecified ovary: Secondary | ICD-10-CM

## 2016-03-19 ENCOUNTER — Encounter: Payer: Self-pay | Admitting: Oncology

## 2016-03-19 ENCOUNTER — Ambulatory Visit (HOSPITAL_BASED_OUTPATIENT_CLINIC_OR_DEPARTMENT_OTHER): Payer: Medicare Other | Admitting: Oncology

## 2016-03-19 ENCOUNTER — Other Ambulatory Visit (HOSPITAL_BASED_OUTPATIENT_CLINIC_OR_DEPARTMENT_OTHER): Payer: Medicare Other

## 2016-03-19 ENCOUNTER — Ambulatory Visit: Payer: Medicare Other

## 2016-03-19 ENCOUNTER — Other Ambulatory Visit: Payer: Self-pay | Admitting: Gynecologic Oncology

## 2016-03-19 VITALS — BP 133/73 | HR 94 | Temp 98.0°F | Resp 18 | Ht 67.0 in | Wt 141.0 lb

## 2016-03-19 DIAGNOSIS — C801 Malignant (primary) neoplasm, unspecified: Secondary | ICD-10-CM

## 2016-03-19 DIAGNOSIS — C482 Malignant neoplasm of peritoneum, unspecified: Secondary | ICD-10-CM

## 2016-03-19 DIAGNOSIS — C786 Secondary malignant neoplasm of retroperitoneum and peritoneum: Secondary | ICD-10-CM

## 2016-03-19 DIAGNOSIS — C7802 Secondary malignant neoplasm of left lung: Secondary | ICD-10-CM | POA: Diagnosis not present

## 2016-03-19 DIAGNOSIS — C569 Malignant neoplasm of unspecified ovary: Secondary | ICD-10-CM

## 2016-03-19 DIAGNOSIS — D6481 Anemia due to antineoplastic chemotherapy: Secondary | ICD-10-CM

## 2016-03-19 DIAGNOSIS — G620 Drug-induced polyneuropathy: Secondary | ICD-10-CM | POA: Insufficient documentation

## 2016-03-19 DIAGNOSIS — T451X5A Adverse effect of antineoplastic and immunosuppressive drugs, initial encounter: Secondary | ICD-10-CM

## 2016-03-19 DIAGNOSIS — Z95828 Presence of other vascular implants and grafts: Secondary | ICD-10-CM

## 2016-03-19 DIAGNOSIS — D701 Agranulocytosis secondary to cancer chemotherapy: Secondary | ICD-10-CM

## 2016-03-19 DIAGNOSIS — E119 Type 2 diabetes mellitus without complications: Secondary | ICD-10-CM

## 2016-03-19 DIAGNOSIS — C7801 Secondary malignant neoplasm of right lung: Secondary | ICD-10-CM

## 2016-03-19 LAB — BASIC METABOLIC PANEL
Anion Gap: 11 mEq/L (ref 3–11)
BUN: 19.6 mg/dL (ref 7.0–26.0)
CALCIUM: 9.8 mg/dL (ref 8.4–10.4)
CO2: 24 meq/L (ref 22–29)
CREATININE: 1 mg/dL (ref 0.6–1.1)
Chloride: 103 mEq/L (ref 98–109)
EGFR: 65 mL/min/{1.73_m2} — ABNORMAL LOW (ref >90)
GLUCOSE: 291 mg/dL — AB (ref 70–140)
Potassium: 4 mEq/L (ref 3.5–5.1)
Sodium: 137 mEq/L (ref 136–145)

## 2016-03-19 LAB — CBC WITH DIFFERENTIAL/PLATELET
BASO%: 0.2 % (ref 0.0–2.0)
BASOS ABS: 0 10*3/uL (ref 0.0–0.1)
EOS ABS: 0.1 10*3/uL (ref 0.0–0.5)
EOS%: 1.8 % (ref 0.0–7.0)
HCT: 28.6 % — ABNORMAL LOW (ref 34.8–46.6)
HEMOGLOBIN: 9.1 g/dL — AB (ref 11.6–15.9)
LYMPH#: 1.6 10*3/uL (ref 0.9–3.3)
LYMPH%: 27.4 % (ref 14.0–49.7)
MCH: 23.9 pg — AB (ref 25.1–34.0)
MCHC: 31.8 g/dL (ref 31.5–36.0)
MCV: 75.3 fL — AB (ref 79.5–101.0)
MONO#: 0.3 10*3/uL (ref 0.1–0.9)
MONO%: 5.7 % (ref 0.0–14.0)
NEUT#: 3.9 10*3/uL (ref 1.5–6.5)
NEUT%: 64.9 % (ref 38.4–76.8)
Platelets: 126 10*3/uL — ABNORMAL LOW (ref 145–400)
RBC: 3.8 10*6/uL (ref 3.70–5.45)
RDW: 19.5 % — AB (ref 11.2–14.5)
WBC: 6 10*3/uL (ref 3.9–10.3)

## 2016-03-19 MED ORDER — HEMOCYTE 324 (106 FE) MG PO TABS: 1.0000 | ORAL_TABLET | Freq: Every day | ORAL | 2 refills | Status: DC

## 2016-03-19 MED ORDER — SODIUM CHLORIDE 0.9% FLUSH
10.0000 mL | INTRAVENOUS | Status: DC | PRN
  Administered 2016-03-19: 10 mL via INTRAVENOUS
  Filled 2016-03-19: qty 10

## 2016-03-19 NOTE — Progress Notes (Signed)
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  She is responding to carboplatin taxol as initial intervention, due cycle 6 on 03-26-16.  She will have follow up with Dr Alycia Rossetti on 04-28-16, with CT CAP prior to visit.  Patient was transfused 1 unit PRBCs on 02-17-16 for hgb 8.2 and symptomatic. Patient had more fatigue following cycle 5 carbo taxol on 03-04-16, which was not extreme, and she is feeling better now. She used ~ 5 zofran total since last chemo, no vomiting, still able to take po's. She had some increased constipation despite miralax 1-2x daily, resolved with extra prn laxative and bowels fine now. She did not have taxol aches (does take claritin daily) and minimal taxol peripheral neuropathy. Blood sugars were more elevated for 1 day after chemo, covered with SS meds per Dr Sharlet Salina; blood sugars this week have been 150-170 fasting. She has occasional NP cough, feels discomfort right anterior chest with harder coughing, no other chest pain, no wheezing or SOB. No problems with PAC. No bleeding, no tarry stools. Remainder of 14 point Review of Systems negative.   Flu vaccine 10-28-15 PAC placed by IR 01-01-16 Genetics testing normal 02-04-16 (298 gene myRisk panel by [employer]). Tumor BRACAnalysis and Genomic Instability Status Testing was also performed through [employer], negative. CA 125 was 739 on 12-25-15.   ER PR be added to surgical path [phone] from 11-14-15, per my request to [facility] pathology 03-19-16    ONCOLOGIC HISTORY Patient developed some mild GI symptoms in past 3-4 months, initially thought related to the diabetes medications. Symptoms of constipation, early satiety and  abdominal fullness and general abdominal soreness persisted, with CT AP 11-07-15 found bibasilar pulmonary nodules and small right pleural effusion; uterus surgically absent, right adnexal solid and cystic mass5.8 x 3.7 x 4.7 cm; predominantly cystic mass with smaller soft tissue component in left adnexa6.1 x 2.8 x 7.0 cm; diffuse omental tumor infiltration up to 3 cm thick;tumor deposits are seen in the pericolic gutters bilaterally; large tumor deposit at the cul de sac at least 4.1 cm diameter; probable tumor deposits along small bowel loops and colon in pelvis. CA 125 on 11-12-15 was 301.8, with CEA 8.3. She was seen by Dr Alycia Rossetti on 11-14-15, with omental biopsy 11-14-15 ([id number]) adenocarcinoma consistent with metastatic ovarian. Recommendation was for neoadjuvant chemotherapy with consideration of surgery by gyn oncology after ~ cycle 3 depending on response Cycle 1 carbo taxol given 12-12-15. CA 125 on 12-05-15 was 469 and on 12-25-15 was 739. She was neutropenic with ANC 0.8 on day 14 cycle 1. CT chest 12-10-15 showed multiple bilateral pulmonary mets and right pleural effusion, tho not enough pleural fluid to tap.  Genetics testing 02-04-16 with 28-gene myRisk Hereditary Cancer Panel through [employer] found no deleterious mutations  APC, ATM, BARD1, BMPR1A, BRCA1, BRCA2, BRIP1, CHD1, CDK4, CDKN2A, CHEK2, EPCAM (large rearrangement only),GREM1/SCG5, MLH1, MSH2, MSH6, MUTYH, NBN, PALB2, PMS2, POLD1, POLE, PTEN, RAD51C, RAD51D, SMAD4, STK11, and TP53.No VUS. Tumor BRACAnalysis and Genomic Instability Status Testing was also performed through [employer] and was negative. CT CAP 02-04-16 after 3 cycles chemo, compared with CT AP done a month prior to first chemo, shows some improvement multiple pulmonary mets with decrease in low left cervical/ supraclavicular  adenopathy and decrease in small right pleural effusion, improved omental and peritoneal mets, stable right  adnexal mass and increase in left adnexal cystic lesion.  Plan is to complete 3 additional cycles of carboplatin taxol, then repeat scans.   Objective:  Vital signs in last 24 hours:  BP 133/73 (BP Location: Right Arm, Patient Position: Sitting)   Pulse 94   Temp 98 F (36.7 C) (Oral)   Resp 18   Ht '5\' 7"'$  (1.702 m)   Wt 141 lb (64 kg)   SpO2 99%   BMI 22.08 kg/m  Weight down 2 lbs. Alert, oriented and appropriate, very pleasant as always, looks comfortable. Easily ambulatory and able to get on and off exam table.  Alopecia  HEENT:PERRL, sclerae not icteric. Oral mucosa moist without lesions, posterior pharynx some dull erythema without exudate. Neck supple. No JVD.  Lymphatics:no cervical,suraclavicular, axillary or inguinal adenopathy Resp:Respirations not labored,  lungs clear to auscultation anteriorly and posteriorly, no rubs, no wheezes or rales. Coughing x1 during exam, sounds NP. No dullness to percussion bilaterally Cardio: regular rate and rhythm. No gallop. GI: soft, nontender, not distended, no mass or organomegaly. Normally active bowel sounds.  Musculoskeletal/ Extremities: LE without pitting edema, cords, tenderness Neuro: no significant peripheral neuropathy fingers or toes. Otherwise nonfocal. PSYCH appropriate mood and affect Skin without rash, ecchymosis, petechiae Portacath-without erythema or tenderness  Lab Results:  Results for orders placed or performed in visit on 03/19/16  CBC with Differential  Result Value Ref Range   WBC 6.0 3.9 - 10.3 10e3/uL   NEUT# 3.9 1.5 - 6.5 10e3/uL   HGB 9.1 (L) 11.6 - 15.9 g/dL   HCT 28.6 (L) 34.8 - 46.6 %   Platelets 126 (L) 145 - 400 10e3/uL   MCV 75.3 (L) 79.5 - 101.0 fL   MCH 23.9 (L) 25.1 - 34.0 pg   MCHC 31.8 31.5 - 36.0 g/dL   RBC 3.80 3.70 - 5.45 10e6/uL   RDW 19.5 (H) 11.2 - 14.5 %   lymph# 1.6 0.9 - 3.3 10e3/uL   MONO# 0.3 0.1 - 0.9 10e3/uL   Eosinophils Absolute 0.1 0.0 - 0.5 10e3/uL   Basophils  Absolute 0.0 0.0 - 0.1 10e3/uL   NEUT% 64.9 38.4 - 76.8 %   LYMPH% 27.4 14.0 - 49.7 %   MONO% 5.7 0.0 - 14.0 %   EOS% 1.8 0.0 - 7.0 %   BASO% 0.2 0.0 - 2.0 %  Basic metabolic panel  Result Value Ref Range   Sodium 137 136 - 145 mEq/L   Potassium 4.0 3.5 - 5.1 mEq/L   Chloride 103 98 - 109 mEq/L   CO2 24 22 - 29 mEq/L   Glucose 291 (H) 70 - 140 mg/dl   BUN 19.6 7.0 - 26.0 mg/dL   Creatinine 1.0 0.6 - 1.1 mg/dL   Calcium 9.8 8.4 - 10.4 mg/dL   Anion Gap 11 3 - 11 mEq/L   EGFR 65 (L) >90 ml/min/1.73 m2   Labs reviewed with patient and daughter.  CA 125 on 03-04-16 down to 180, this having been 307 on 02-05-16 and 739 on 12-25-15.   Will repeat CA 125 day of cycle 6 chemo.  Studies/Results:  No results found.  Medications: I have reviewed the patient's current medications. Instructed patient to decrease oral decadron premedication for taxol to 16 mg 12 hrs and 6 hrs prior to treatment for cycle 6 Adjust miralax as needed to keep bowels moving daily  DISCUSSION Clinically much improved with  5 cycles of carbo taxol thus far, which she is tolerating adequately tho some cumulative side effects.  Patient has asked that we discuss again the goal of treatment, and I have been very clear that disease is not medically curable, and that treatment is in attempt to improve and control the malignancy, while balancing quality of life. They understand that, given metastatic disease to lungs, any surgery by gyn oncology would not be curative, would have risks associated with surgery and healing and recovery required afterwards.  I have explained that repeating scans after cycle 6 chemo will help treatment decision from here, which could be another few cycles of same chemo if still improving and she feels this would be tolerable, or possibly continuing carboplatin as single agent to be a little easier. When she gets to maximum benefit from first line chemo, other systemic treatment including other chemo can  be considered. I mentioned treatment breaks with close observation if very good response to treatment. I have mentioned some systemic treatments that are not chemotherapy may be helpful later. She understands that she can tell providers at any time if she does not want to continue a particular treatment, and that providers will also tell her if not correct to continue.   She is in agreement with continuing this chemo for cycle 6 on 03-26-16. She understands that she will have at least a short break off of treatment between cycle 6 and when next part of plan is formulated after scans in Feb.  Patient is appreciative of care, including with Dr Alvy Bimler and Dr Alycia Rossetti.  Following visit, I have requested ER PR be added to [facility] surgical path [phone] from 11-14-15.  Message to gyn oncology re CT CAP shortly prior to Dr Elenora Gamma visit late Feb.   Assessment/Plan: 1. 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 Will give cycle 6 on 03-26-16 with granix. She will see Dr Alvy Bimler with labs after cycle 6, then repeat scans shortly prior to seeing Dr Alycia Rossetti on 04-28-16.    No genetic abnormalities identified 01-2016. ER PR testing requested on initial path, so that information will be available in case helpful for later line of treatment. 2.Anemia probably multifactorial including chemo, iron not markedly low 02-13-16, on oral iron. Had 1 unit PRBCs on 02-27-16. Follow 3.Diabetes on metformin and SS insulin managed byDr Sharlet Salina. SS insulin with chemo infusions due to decadron.  Decrease oral decadron for cycle 6 as above 4.Constipation improved with miralax. 5.post hysterectomy without oophorectomy ~ 1970 6.flu vaccine 10-28-15 7.work noise exposure in past with decreased  hearing 8.minimal past tobacco, DCd 1977. Second hand tobacco exposure. 9.Living WIll and HCPOA in place.  10.Social: daughter and her 83 yo twins now living with patient; daughter very supportive. Patient's autistic adult son also in the home. 11..environmental allergies: better with flonase to ongoing claritin 12.PAC placed by IR 01-01-16 13.peripheral neuropathy from chemo with underlying DM: still minimal, follow   All questions answered and patient / daughter seem to understand information as above, know to call if concerns prior to scheduled appointments. Chemo and granix orders confirmed. Time spent 25 min including >50% counseling and coordination of care. Route Dr Sharlet Salina, in EMR for Drs Alvy Bimler and Alycia Rossetti - thank you   Evlyn Clines, MD   03/19/2016, 12:22 PM

## 2016-03-25 ENCOUNTER — Encounter: Payer: Self-pay | Admitting: Oncology

## 2016-03-25 ENCOUNTER — Ambulatory Visit: Payer: Medicare Other

## 2016-03-25 ENCOUNTER — Other Ambulatory Visit: Payer: Medicare Other

## 2016-03-25 NOTE — Progress Notes (Signed)
Medical Oncology  ADDENDUM TO SURGICAL PATH [phone] ER 80% positive, strong staining intensity PR 10% positive, strong staining intensity  Report in EMR now.  Godfrey Pick, MD

## 2016-03-26 ENCOUNTER — Ambulatory Visit (HOSPITAL_BASED_OUTPATIENT_CLINIC_OR_DEPARTMENT_OTHER): Payer: Medicare Other

## 2016-03-26 ENCOUNTER — Other Ambulatory Visit (HOSPITAL_BASED_OUTPATIENT_CLINIC_OR_DEPARTMENT_OTHER): Payer: Medicare Other

## 2016-03-26 ENCOUNTER — Ambulatory Visit: Payer: Medicare Other

## 2016-03-26 VITALS — BP 151/84 | HR 100 | Temp 97.7°F | Resp 18

## 2016-03-26 DIAGNOSIS — C7801 Secondary malignant neoplasm of right lung: Secondary | ICD-10-CM | POA: Diagnosis not present

## 2016-03-26 DIAGNOSIS — C801 Malignant (primary) neoplasm, unspecified: Secondary | ICD-10-CM | POA: Diagnosis not present

## 2016-03-26 DIAGNOSIS — C7802 Secondary malignant neoplasm of left lung: Secondary | ICD-10-CM | POA: Diagnosis not present

## 2016-03-26 DIAGNOSIS — E119 Type 2 diabetes mellitus without complications: Secondary | ICD-10-CM

## 2016-03-26 DIAGNOSIS — Z5111 Encounter for antineoplastic chemotherapy: Secondary | ICD-10-CM | POA: Diagnosis not present

## 2016-03-26 DIAGNOSIS — C786 Secondary malignant neoplasm of retroperitoneum and peritoneum: Secondary | ICD-10-CM

## 2016-03-26 DIAGNOSIS — C569 Malignant neoplasm of unspecified ovary: Secondary | ICD-10-CM

## 2016-03-26 DIAGNOSIS — Z95828 Presence of other vascular implants and grafts: Secondary | ICD-10-CM

## 2016-03-26 DIAGNOSIS — C482 Malignant neoplasm of peritoneum, unspecified: Secondary | ICD-10-CM

## 2016-03-26 LAB — COMPREHENSIVE METABOLIC PANEL
ALT: 9 U/L (ref 0–55)
ANION GAP: 15 meq/L — AB (ref 3–11)
AST: 14 U/L (ref 5–34)
Albumin: 3.8 g/dL (ref 3.5–5.0)
Alkaline Phosphatase: 84 U/L (ref 40–150)
BUN: 25.2 mg/dL (ref 7.0–26.0)
CALCIUM: 10.2 mg/dL (ref 8.4–10.4)
CHLORIDE: 99 meq/L (ref 98–109)
CO2: 20 meq/L — AB (ref 22–29)
CREATININE: 1.1 mg/dL (ref 0.6–1.1)
EGFR: 55 mL/min/{1.73_m2} — AB (ref >90)
Glucose: 338 mg/dl — ABNORMAL HIGH (ref 70–140)
POTASSIUM: 4.3 meq/L (ref 3.5–5.1)
Sodium: 134 mEq/L — ABNORMAL LOW (ref 136–145)
Total Bilirubin: 0.33 mg/dL (ref 0.20–1.20)
Total Protein: 7.4 g/dL (ref 6.4–8.3)

## 2016-03-26 LAB — CBC WITH DIFFERENTIAL/PLATELET
BASO%: 0.1 % (ref 0.0–2.0)
Basophils Absolute: 0 10*3/uL (ref 0.0–0.1)
EOS%: 0 % (ref 0.0–7.0)
Eosinophils Absolute: 0 10*3/uL (ref 0.0–0.5)
HEMATOCRIT: 29 % — AB (ref 34.8–46.6)
HGB: 9.2 g/dL — ABNORMAL LOW (ref 11.6–15.9)
LYMPH#: 0.6 10*3/uL — AB (ref 0.9–3.3)
LYMPH%: 9 % — AB (ref 14.0–49.7)
MCH: 24.1 pg — AB (ref 25.1–34.0)
MCHC: 31.8 g/dL (ref 31.5–36.0)
MCV: 75.6 fL — AB (ref 79.5–101.0)
MONO#: 0 10*3/uL — AB (ref 0.1–0.9)
MONO%: 0.2 % (ref 0.0–14.0)
NEUT#: 5.8 10*3/uL (ref 1.5–6.5)
NEUT%: 90.7 % — AB (ref 38.4–76.8)
PLATELETS: 154 10*3/uL (ref 145–400)
RBC: 3.84 10*6/uL (ref 3.70–5.45)
RDW: 19.9 % — ABNORMAL HIGH (ref 11.2–14.5)
WBC: 6.4 10*3/uL (ref 3.9–10.3)

## 2016-03-26 MED ORDER — FAMOTIDINE IN NACL 20-0.9 MG/50ML-% IV SOLN
INTRAVENOUS | Status: AC
  Filled 2016-03-26: qty 50

## 2016-03-26 MED ORDER — ONDANSETRON HCL 8 MG PO TABS
ORAL_TABLET | ORAL | Status: AC
  Filled 2016-03-26: qty 1

## 2016-03-26 MED ORDER — DIPHENHYDRAMINE HCL 50 MG/ML IJ SOLN
25.0000 mg | Freq: Once | INTRAMUSCULAR | Status: AC
  Administered 2016-03-26: 25 mg via INTRAVENOUS

## 2016-03-26 MED ORDER — INSULIN REGULAR HUMAN 100 UNIT/ML IJ SOLN
8.0000 [IU] | Freq: Once | INTRAMUSCULAR | Status: AC | PRN
Start: 2016-03-26 — End: 2016-03-26
  Administered 2016-03-26: 8 [IU] via SUBCUTANEOUS
  Filled 2016-03-26: qty 0.08

## 2016-03-26 MED ORDER — SODIUM CHLORIDE 0.9 % IV SOLN
175.0000 mg/m2 | Freq: Once | INTRAVENOUS | Status: AC
  Administered 2016-03-26: 318 mg via INTRAVENOUS
  Filled 2016-03-26: qty 53

## 2016-03-26 MED ORDER — SODIUM CHLORIDE 0.9% FLUSH
10.0000 mL | INTRAVENOUS | Status: DC | PRN
  Administered 2016-03-26: 10 mL via INTRAVENOUS
  Filled 2016-03-26: qty 10

## 2016-03-26 MED ORDER — FAMOTIDINE IN NACL 20-0.9 MG/50ML-% IV SOLN
20.0000 mg | Freq: Once | INTRAVENOUS | Status: AC
  Administered 2016-03-26: 20 mg via INTRAVENOUS

## 2016-03-26 MED ORDER — SODIUM CHLORIDE 0.9% FLUSH
10.0000 mL | INTRAVENOUS | Status: DC | PRN
  Administered 2016-03-26: 10 mL
  Filled 2016-03-26: qty 10

## 2016-03-26 MED ORDER — SODIUM CHLORIDE 0.9 % IV SOLN
Freq: Once | INTRAVENOUS | Status: AC
  Administered 2016-03-26: 10:00:00 via INTRAVENOUS

## 2016-03-26 MED ORDER — SODIUM CHLORIDE 0.9 % IV SOLN
Freq: Once | INTRAVENOUS | Status: AC
  Administered 2016-03-26: 11:00:00 via INTRAVENOUS
  Filled 2016-03-26: qty 5

## 2016-03-26 MED ORDER — ONDANSETRON HCL 8 MG PO TABS
8.0000 mg | ORAL_TABLET | Freq: Once | ORAL | Status: AC
  Administered 2016-03-26: 8 mg via ORAL

## 2016-03-26 MED ORDER — SODIUM CHLORIDE 0.9 % IV SOLN
426.6000 mg | Freq: Once | INTRAVENOUS | Status: AC
  Administered 2016-03-26: 430 mg via INTRAVENOUS
  Filled 2016-03-26: qty 43

## 2016-03-26 MED ORDER — HEPARIN SOD (PORK) LOCK FLUSH 100 UNIT/ML IV SOLN
500.0000 [IU] | Freq: Once | INTRAVENOUS | Status: AC | PRN
  Administered 2016-03-26: 500 [IU]
  Filled 2016-03-26: qty 5

## 2016-03-26 MED ORDER — DIPHENHYDRAMINE HCL 50 MG/ML IJ SOLN
INTRAMUSCULAR | Status: AC
Start: 2016-03-26 — End: 2016-03-26
  Filled 2016-03-26: qty 1

## 2016-03-26 NOTE — Progress Notes (Signed)
Patient tolerated treatment well. Patient stable upon discharge.  

## 2016-03-26 NOTE — Patient Instructions (Signed)
Cancer Center Discharge Instructions for Patients Receiving Chemotherapy  Today you received the following chemotherapy agents Taxol and Carboplatin. To help prevent nausea and vomiting after your treatment, we encourage you to take your nausea medication as directed.  If you develop nausea and vomiting that is not controlled by your nausea medication, call the clinic.   BELOW ARE SYMPTOMS THAT SHOULD BE REPORTED IMMEDIATELY:  *FEVER GREATER THAN 100.5 F  *CHILLS WITH OR WITHOUT FEVER  NAUSEA AND VOMITING THAT IS NOT CONTROLLED WITH YOUR NAUSEA MEDICATION  *UNUSUAL SHORTNESS OF BREATH  *UNUSUAL BRUISING OR BLEEDING  TENDERNESS IN MOUTH AND THROAT WITH OR WITHOUT PRESENCE OF ULCERS  *URINARY PROBLEMS  *BOWEL PROBLEMS  UNUSUAL RASH Items with * indicate a potential emergency and should be followed up as soon as possible.  Feel free to call the clinic you have any questions or concerns. The clinic phone number is [phone].  Please show the CHEMO ALERT CARD at check-in to the Emergency Department and triage nurse.    

## 2016-03-27 LAB — CA 125: CANCER ANTIGEN (CA) 125: 132.8 U/mL — AB (ref 0.0–38.1)

## 2016-03-30 ENCOUNTER — Ambulatory Visit (HOSPITAL_BASED_OUTPATIENT_CLINIC_OR_DEPARTMENT_OTHER): Payer: Medicare Other

## 2016-03-30 ENCOUNTER — Telehealth: Payer: Self-pay

## 2016-03-30 VITALS — BP 114/72 | HR 100 | Temp 98.1°F | Resp 20

## 2016-03-30 DIAGNOSIS — C786 Secondary malignant neoplasm of retroperitoneum and peritoneum: Secondary | ICD-10-CM | POA: Diagnosis not present

## 2016-03-30 DIAGNOSIS — C7801 Secondary malignant neoplasm of right lung: Secondary | ICD-10-CM

## 2016-03-30 DIAGNOSIS — Z5189 Encounter for other specified aftercare: Secondary | ICD-10-CM | POA: Diagnosis not present

## 2016-03-30 DIAGNOSIS — C7802 Secondary malignant neoplasm of left lung: Secondary | ICD-10-CM

## 2016-03-30 DIAGNOSIS — C801 Malignant (primary) neoplasm, unspecified: Secondary | ICD-10-CM

## 2016-03-30 DIAGNOSIS — Z95828 Presence of other vascular implants and grafts: Secondary | ICD-10-CM

## 2016-03-30 MED ORDER — TBO-FILGRASTIM 300 MCG/0.5ML ~~LOC~~ SOSY
300.0000 ug | PREFILLED_SYRINGE | Freq: Once | SUBCUTANEOUS | Status: AC
  Administered 2016-03-30: 300 ug via SUBCUTANEOUS
  Filled 2016-03-30: qty 0.5

## 2016-03-30 NOTE — Telephone Encounter (Signed)
S/w daughter and pt with results per Dr Marko Plume attached message. Confirmed upcoming appts.

## 2016-03-30 NOTE — Patient Instructions (Signed)

## 2016-03-30 NOTE — Telephone Encounter (Signed)
-----   Message from Gordy Levan, MD sent at 03/29/2016  8:12 AM EST ----- Labs seen and need follow up Please let her know CA 125 some better again, now 132

## 2016-03-31 ENCOUNTER — Ambulatory Visit (HOSPITAL_BASED_OUTPATIENT_CLINIC_OR_DEPARTMENT_OTHER): Payer: Medicare Other

## 2016-03-31 VITALS — BP 123/74 | HR 100 | Temp 97.0°F | Resp 18

## 2016-03-31 DIAGNOSIS — Z5189 Encounter for other specified aftercare: Secondary | ICD-10-CM | POA: Diagnosis not present

## 2016-03-31 DIAGNOSIS — C801 Malignant (primary) neoplasm, unspecified: Secondary | ICD-10-CM

## 2016-03-31 DIAGNOSIS — C7802 Secondary malignant neoplasm of left lung: Secondary | ICD-10-CM

## 2016-03-31 DIAGNOSIS — Z95828 Presence of other vascular implants and grafts: Secondary | ICD-10-CM

## 2016-03-31 DIAGNOSIS — C786 Secondary malignant neoplasm of retroperitoneum and peritoneum: Secondary | ICD-10-CM

## 2016-03-31 DIAGNOSIS — C7801 Secondary malignant neoplasm of right lung: Secondary | ICD-10-CM | POA: Diagnosis not present

## 2016-03-31 MED ORDER — TBO-FILGRASTIM 300 MCG/0.5ML ~~LOC~~ SOSY
300.0000 ug | PREFILLED_SYRINGE | Freq: Once | SUBCUTANEOUS | Status: AC
  Administered 2016-03-31: 300 ug via SUBCUTANEOUS
  Filled 2016-03-31: qty 0.5

## 2016-03-31 NOTE — Patient Instructions (Signed)

## 2016-04-15 ENCOUNTER — Encounter: Payer: Self-pay | Admitting: Hematology and Oncology

## 2016-04-15 ENCOUNTER — Other Ambulatory Visit: Payer: Self-pay | Admitting: Hematology and Oncology

## 2016-04-15 ENCOUNTER — Ambulatory Visit (HOSPITAL_BASED_OUTPATIENT_CLINIC_OR_DEPARTMENT_OTHER): Payer: Medicare Other

## 2016-04-15 ENCOUNTER — Other Ambulatory Visit (HOSPITAL_BASED_OUTPATIENT_CLINIC_OR_DEPARTMENT_OTHER): Payer: Medicare Other

## 2016-04-15 ENCOUNTER — Ambulatory Visit (HOSPITAL_BASED_OUTPATIENT_CLINIC_OR_DEPARTMENT_OTHER): Payer: Medicare Other | Admitting: Hematology and Oncology

## 2016-04-15 ENCOUNTER — Telehealth: Payer: Self-pay | Admitting: Hematology and Oncology

## 2016-04-15 DIAGNOSIS — C561 Malignant neoplasm of right ovary: Secondary | ICD-10-CM

## 2016-04-15 DIAGNOSIS — E119 Type 2 diabetes mellitus without complications: Secondary | ICD-10-CM

## 2016-04-15 DIAGNOSIS — C569 Malignant neoplasm of unspecified ovary: Secondary | ICD-10-CM

## 2016-04-15 DIAGNOSIS — C786 Secondary malignant neoplasm of retroperitoneum and peritoneum: Secondary | ICD-10-CM

## 2016-04-15 DIAGNOSIS — Z794 Long term (current) use of insulin: Secondary | ICD-10-CM

## 2016-04-15 DIAGNOSIS — C7800 Secondary malignant neoplasm of unspecified lung: Secondary | ICD-10-CM | POA: Diagnosis not present

## 2016-04-15 DIAGNOSIS — C482 Malignant neoplasm of peritoneum, unspecified: Secondary | ICD-10-CM

## 2016-04-15 DIAGNOSIS — T451X5A Adverse effect of antineoplastic and immunosuppressive drugs, initial encounter: Secondary | ICD-10-CM

## 2016-04-15 DIAGNOSIS — G620 Drug-induced polyneuropathy: Secondary | ICD-10-CM

## 2016-04-15 DIAGNOSIS — C801 Malignant (primary) neoplasm, unspecified: Secondary | ICD-10-CM

## 2016-04-15 DIAGNOSIS — Z95828 Presence of other vascular implants and grafts: Secondary | ICD-10-CM

## 2016-04-15 DIAGNOSIS — D509 Iron deficiency anemia, unspecified: Secondary | ICD-10-CM

## 2016-04-15 DIAGNOSIS — D6481 Anemia due to antineoplastic chemotherapy: Secondary | ICD-10-CM

## 2016-04-15 LAB — CBC WITH DIFFERENTIAL/PLATELET
BASO%: 0.4 % (ref 0.0–2.0)
BASOS ABS: 0 10*3/uL (ref 0.0–0.1)
EOS ABS: 0 10*3/uL (ref 0.0–0.5)
EOS%: 0.3 % (ref 0.0–7.0)
HCT: 26 % — ABNORMAL LOW (ref 34.8–46.6)
HEMOGLOBIN: 8.4 g/dL — AB (ref 11.6–15.9)
LYMPH%: 22.7 % (ref 14.0–49.7)
MCH: 24.7 pg — AB (ref 25.1–34.0)
MCHC: 32.5 g/dL (ref 31.5–36.0)
MCV: 76 fL — AB (ref 79.5–101.0)
MONO#: 0.4 10*3/uL (ref 0.1–0.9)
MONO%: 8.1 % (ref 0.0–14.0)
NEUT#: 3.2 10*3/uL (ref 1.5–6.5)
NEUT%: 68.5 % (ref 38.4–76.8)
Platelets: 155 10*3/uL (ref 145–400)
RBC: 3.42 10*6/uL — AB (ref 3.70–5.45)
RDW: 18.2 % — AB (ref 11.2–14.5)
WBC: 4.7 10*3/uL (ref 3.9–10.3)
lymph#: 1.1 10*3/uL (ref 0.9–3.3)

## 2016-04-15 LAB — COMPREHENSIVE METABOLIC PANEL
ALBUMIN: 3.5 g/dL (ref 3.5–5.0)
ALK PHOS: 78 U/L (ref 40–150)
ALT: 9 U/L (ref 0–55)
ANION GAP: 10 meq/L (ref 3–11)
AST: 15 U/L (ref 5–34)
BUN: 21.7 mg/dL (ref 7.0–26.0)
CALCIUM: 9.9 mg/dL (ref 8.4–10.4)
CHLORIDE: 101 meq/L (ref 98–109)
CO2: 25 mEq/L (ref 22–29)
CREATININE: 0.9 mg/dL (ref 0.6–1.1)
EGFR: 69 mL/min/{1.73_m2} — ABNORMAL LOW (ref >90)
Glucose: 224 mg/dl — ABNORMAL HIGH (ref 70–140)
POTASSIUM: 4 meq/L (ref 3.5–5.1)
Sodium: 136 mEq/L (ref 136–145)
Total Bilirubin: 0.28 mg/dL (ref 0.20–1.20)
Total Protein: 6.9 g/dL (ref 6.4–8.3)

## 2016-04-15 MED ORDER — SODIUM CHLORIDE 0.9% FLUSH
10.0000 mL | INTRAVENOUS | Status: DC | PRN
  Administered 2016-04-15: 10 mL via INTRAVENOUS
  Filled 2016-04-15: qty 10

## 2016-04-15 MED ORDER — HEPARIN SOD (PORK) LOCK FLUSH 100 UNIT/ML IV SOLN
500.0000 [IU] | Freq: Once | INTRAVENOUS | Status: AC | PRN
  Administered 2016-04-15: 500 [IU] via INTRAVENOUS
  Filled 2016-04-15: qty 5

## 2016-04-15 NOTE — Telephone Encounter (Signed)
Appointments scheduled per 2/15 LOS. Patient given AVS report and calendars with future scheduled appointments. °

## 2016-04-16 ENCOUNTER — Telehealth: Payer: Self-pay | Admitting: Hematology and Oncology

## 2016-04-16 ENCOUNTER — Encounter: Payer: Self-pay | Admitting: Hematology and Oncology

## 2016-04-16 LAB — CA 125: Cancer Antigen (CA) 125: 109.4 U/mL — ABNORMAL HIGH (ref 0.0–38.1)

## 2016-04-16 NOTE — Assessment & Plan Note (Signed)
She has fatigue We dicussed potential iv iron in the future if she cannot tolerate oral iron supplement

## 2016-04-16 NOTE — Telephone Encounter (Signed)
Called patient to inform her of next scheduled appointments for 2/22.

## 2016-04-16 NOTE — Assessment & Plan Note (Signed)
She is recovering slowly from recent chemotherapy Her main issue has been not eating well. She has lost some weight since she was last seen We discussed supportive care measures She has imaging studies ordered next week I will see her back in 2 weeks to review test results Recent additional ER markers on previous tumor cells were positive; she is potentially a candidate for possible anti-estrogen therapy in the future

## 2016-04-16 NOTE — Assessment & Plan Note (Signed)
There is a component of anemia of chronic disease from recent chemotheraoy. The patient denies recent history of bleeding such as epistaxis, hematuria or hematochezia. She is asymptomatic from the anemia. We will observe for now.  She is taking oral iron supplement as directed

## 2016-04-16 NOTE — Assessment & Plan Note (Signed)
she has mild peripheral neuropathy, likely related to side effects of treatment and diabetic neuropathy It is only mild, not bothering the patient. I will observe for now

## 2016-04-16 NOTE — Assessment & Plan Note (Signed)
She is using insulin as directed along with metformin I recommend holding metformin and her HCTZ/lisinopril the day before, day of and day after CT to reduce risk of contrast nephropathy

## 2016-04-16 NOTE — Progress Notes (Signed)
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 female was transferred to my care after her prior physician has left.  I reviewed the patient's records extensive and collaborated the history with the patient. Summary of her history is as follows:   Primary peritoneal carcinomatosis ([place])   11/14/2015 Initial Diagnosis    Primary peritoneal carcinomatosis ([place]). Stage IV based on pulmonary nodules.       Ovarian cancer on right [facility])   11/07/2015 Imaging    Multiple pulmonary nodules bilaterally compatible with pulmonary metastases. Peritoneal carcinomatosis with tumor deposits throughout omentum and at the pericolic gutters as well as within the pelvis. BILATERAL cystic and solid ovarian masses, predominately cystic on LEFT and predominately solid on RIGHT, question ovarian neoplasms versus tumor deposits at the ovaries bilaterally from peritoneal carcinomatosis. Low-attenuation retrocrural nodes. Small RIGHT pleural effusion and minimal perihepatic ascites. Question metastasis at pancreatic tail. Sigmoid diverticulosis. BILATERAL inguinal hernias containing fat. Aortic atherosclerosis.       11/12/2015 Tumor Marker    Patient's tumor was tested for the following markers: CA125. Results of the tumor marker test revealed 301.8.      11/14/2015 Procedure    Peritoneal tumor just deep to the right abdominal wall was targeted. A tangential approach was performed with needle insertion to the left of midline. Three core biopsy needle passes resulted in 2 intact core biopsy specimens and 1 partial tissue fragment.  IMPRESSION: CT-guided core biopsy performed of peritoneal tumor.      11/14/2015 Pathology  Results    Soft Tissue Needle Core Biopsy, Peritoneal Cavity - ADENOCARCINOMA. - SEE MICROSCOPIC DESCRIPTION. Microscopic Comment The morphologic features are compatible with metastatic ovarian cancer. There is sufficient material for additional studies if requested. (JDP:gt, 11/17/15) PROGNOSTIC INDICATORS Results: IMMUNOHISTOCHEMICAL AND MORPHOMETRIC ANALYSIS PERFORMED MANUALLY Estrogen Receptor: 80%, POSITIVE, STRONG STAINING INTENSITY Progesterone Receptor: 10%, POSITIVE, STRONG STAINING INTENSITY REFERENCE RANGE ESTROGEN RECEPTOR NEGATIVE 0% POSITIVE =>1% REFERENCE RANGE PROGESTERONE RECEPTOR NEGATIVE 0% POSITIVE =>1% All controls stained appropriately      12/05/2015 Tumor Marker    Patient's tumor was tested for the following markers: CA125 Results of the tumor marker test revealed 469.2      12/10/2015 Imaging    Innumerable pulmonary nodules of variable size compatible with diffuse pulmonary metastatic disease. Nodules appear minimally larger and there are some new tiny pulmonary nodules in the lung bases compared to 11/07/2015. 2. Moderate and enlarging right pleural effusion. 3. Extensive peritoneal carcinomatosis in the upper abdomen. Possible tumor involving the pancreatic tail. 4. Borderline prominent right hilar lymph node.      12/12/2015 - 03/26/2016 Chemotherapy    She has received 6 cycles of carboplatin & taxol       12/25/2015 Tumor Marker    Patient's tumor was tested for the following markers: CA125 Results of the tumor marker test revealed 739.7      01/01/2016 Procedure    Ultrasound and fluoroscopically guided right internal jugular single lumen power port catheter insertion. Tip in the SVC/RA junction. Catheter ready for use      01/19/2016 Tumor Marker    Patient's tumor was tested for the following markers: CA125 Results of the tumor marker test revealed 421.9      02/04/2016 Imaging  CT CHEST IMPRESSION  1. Slight improvement in  pulmonary metastasis. 2. Decrease in low left cervical/supraclavicular adenopathy, favoring response to therapy. 3. Decrease in small right pleural effusion.  CT ABDOMEN AND PELVIS IMPRESSION  1. Improvement in omental/peritoneal metastasis. 2. Enlarged cystic lesion within the left hemipelvis could represent a primary ovarian malignancy or metastasis. Right ovarian soft tissue fullness and heterogeneity is not significantly changed. 3. Pancreatic body/tail junction hypoattenuation is difficult to quantify secondary to morphology. However, felt to be increased in conspicuity today. Considerations include metastatic disease or a somewhat atypical distribution of pancreatic adenocarcinoma with metastasis. 4. Slight improvement in right common iliac high adenopathy. 5. Aortic atherosclerosis.       02/04/2016 Genetic Testing    Genetics testing normal 02-04-16 (298 gene myRisk panel by [facility]). Tumor BRACAnalysis and Genomic Instability Status Testing was also performed through [employer], negative      02/05/2016 Tumor Marker    Patient's tumor was tested for the following markers: CA125 Results of the tumor marker test revealed 307.1      03/04/2016 Tumor Marker    Patient's tumor was tested for the following markers: CA125 Results of the tumor marker test revealed 180.9      03/26/2016 Tumor Marker    Patient's tumor was tested for the following markers: CA125 Results of the tumor marker test revealed 132.8      She is here with her daughter. She also lives with her disabled son She has poor appetite since last chemotherapy and has lost weight. She is also restricting her diet due to diabetes Has mild neuropathy No nausea or changes in bowel habits Denies cough or chest pain No abdominal distension  MEDICAL HISTORY:  Past Medical History:  Diagnosis Date  . Diabetes mellitus    type II, mild  . Hyperlipidemia   . Hypertension    borderline    SURGICAL  HISTORY: Past Surgical History:  Procedure Laterality Date  . ABDOMINAL HYSTERECTOMY     total  . IR GENERIC HISTORICAL  01/01/2016   IR US GUIDE VASC ACCESS RIGHT 01/01/2016 Greggory Keen, MD [facility]  . IR GENERIC HISTORICAL  01/01/2016   IR FLUORO GUIDE PORT INSERTION RIGHT 01/01/2016 Greggory Keen, MD [facility]    SOCIAL HISTORY: Social History   Social History  . Marital status: Divorced    Spouse name: N/A  . Number of children: 2  . Years of education: 12   Occupational History  . retired Astronomer [employer]   Social History Main Topics  . Smoking status: Former Smoker    Packs/day: 0.25    Years: 7.00    Types: Cigarettes    Quit date: 03/01/1978  . Smokeless tobacco: Never Used     Comment: smoked 3-5 cigs per day for 7-8 yrs  . Alcohol use Not on file  . Drug use: Unknown  . Sexual activity: Not Currently   Other Topics Concern  . Not on file   Social History Narrative   HSG. Married '60 - 30yr/divorced. Married '80's - 328yrs/divorced. 1 dtr - '70, 1 son - '64. 2 grandchildren. Retired - [employer]. Lives in own home, son is autistic and lives with her. Staying busy.              FAMILY HISTORY: Family History  Problem Relation Age of Onset  . Hypertension Mother   . Other Mother     CVa  . Stroke Mother   . Cancer Brother  NOS cancer w/ metastasis; d. 66s  . Other Son     Autistic  . Hypertension Father   . Stroke Father   . Hypertension Sister   . Breast cancer Sister 39  . Ovarian cysts Sister     TAH-BSO for benign ovarian cyst  . Seizures Brother     d. 77  . Diabetes Brother   . Prostate cancer Brother     dx "awhile ago"  . Cancer Cousin     maternal 1st cousin, once-removed dx NOS cancer in his late 33s-early 60s    ALLERGIES:  is allergic to codeine.  MEDICATIONS:  Current Outpatient Prescriptions  Medication Sig Dispense Refill  . dexamethasone (DECADRON) 4 MG tablet Take 5 tablets with food 12 hrs and 6 hrs prior to  Taxol Chemotherapy. 30 tablet 0  . HEMOCYTE 324 (106 Fe) MG TABS tablet Take 1 tablet (106 mg of iron total) by mouth daily. 30 tablet 2  . insulin lispro (HUMALOG KWIKPEN) 100 UNIT/ML KiwkPen Check sugars and if: 0-150 take no insulin, 151-250 take 2 units, 251-300 take 4 units, 301-400, take 6 units, >400 call office. 15 mL 11  . lidocaine-prilocaine (EMLA) cream Apply to PortaCath 1-2 hrs prior to access as directed to numb area. 30 g 1  . lisinopril-hydrochlorothiazide (PRINZIDE,ZESTORETIC) 20-12.5 MG tablet take 1 tablet by mouth once daily 90 tablet 3  . loratadine (CLARITIN) 10 MG tablet Take 10 mg by mouth daily as needed for allergies.    Marland Kitchen LORazepam (ATIVAN) 0.5 MG tablet Place 1 tablet under the tongue or swallow every 6 hrs as needed for nausea. Will make drowsy. 30 tablet 0  . Magnesium Hydroxide (MILK OF MAGNESIA CONCENTRATE PO) Take 1 Dose by mouth daily as needed.    . Melatonin 3 MG CAPS Take 1 capsule by mouth at bedtime as needed.    . metFORMIN (GLUCOPHAGE) 1000 MG tablet take 1 tablet by mouth twice a day with food 180 tablet 3  . multivitamin (THERAGRAN) tablet Take 1 tablet by mouth daily.      . ondansetron (ZOFRAN) 8 MG tablet Take 1 tablet (8 mg total) by mouth every 8 (eight) hours as needed for nausea or vomiting (Will not make drowsy.). 30 tablet 1  . polyethylene glycol (MIRALAX / GLYCOLAX) packet Take 17 g by mouth 2 (two) times daily as needed.    . simvastatin (ZOCOR) 40 MG tablet take 1 tablet by mouth at bedtime (Patient not taking: Reported on 04/15/2016) 90 tablet 3   No current facility-administered medications for this visit.    Facility-Administered Medications Ordered in Other Visits  Medication Dose Route Frequency Provider Last Rate Last Dose  . 0.9 %  sodium chloride infusion   Intravenous Continuous Jacqulynn Cadet, MD        REVIEW OF SYSTEMS:   Constitutional: Denies fevers, chills or abnormal night sweats Eyes: Denies blurriness of vision,  double vision or watery eyes Ears, nose, mouth, throat, and face: Denies mucositis or sore throat Respiratory: Denies cough, dyspnea or wheezes Cardiovascular: Denies palpitation, chest discomfort or lower extremity swelling Gastrointestinal:  Denies nausea, heartburn or change in bowel habits Skin: Denies abnormal skin rashes Lymphatics: Denies new lymphadenopathy or easy bruising Neurological:Denies numbness, tingling or new weaknesses Behavioral/Psych: Mood is stable, no new changes  All other systems were reviewed with the patient and are negative.  PHYSICAL EXAMINATION: ECOG PERFORMANCE STATUS: 1 - Symptomatic but completely ambulatory  Vitals:   04/15/16 1321  BP: 121/66  Pulse: 67  Resp: 18  Temp: 98.2 F (36.8 C)   Filed Weights   04/15/16 1321  Weight: 138 lb 8 oz (62.8 kg)    GENERAL:alert, no distress and comfortable SKIN: skin color, texture, turgor are normal, no rashes or significant lesions EYES: normal, conjunctiva are pink and non-injected, sclera clear OROPHARYNX:no exudate, normal lips, buccal mucosa, and tongue  NECK: supple, thyroid normal size, non-tender, without nodularity LYMPH:  no palpable lymphadenopathy in the cervical, axillary or inguinal LUNGS: clear to auscultation and percussion with normal breathing effort HEART: regular rate & rhythm and no murmurs without lower extremity edema ABDOMEN:abdomen soft, non-tender and normal bowel sounds Musculoskeletal:no cyanosis of digits and no clubbing  PSYCH: alert & oriented x 3 with fluent speech NEURO: no focal motor/sensory deficits  LABORATORY DATA:  I have reviewed the data as listed Lab Results  Component Value Date   WBC 4.7 04/15/2016   HGB 8.4 (L) 04/15/2016   HCT 26.0 (L) 04/15/2016   MCV 76.0 (L) 04/15/2016   PLT 155 04/15/2016    Recent Labs  05/07/15 1136 10/10/15 1326  01/01/16 1007  03/04/16 0927 03/19/16 0844 03/26/16 0908 04/15/16 1246  NA 141 138  < > 136  < > 136 137  134* 136  K 4.0 4.0  < > 4.4  < > 4.2 4.0 4.3 4.0  CL 100 101  --  102  --   --   --   --   --   CO2 31 31  < > 25  < > 21* 24 20* 25  GLUCOSE 195* 166*  < > 151*  < > 340* 291* 338* 224*  BUN 17 17  < > 20  < > 26.7* 19.6 25.2 21.7  CREATININE 0.78 0.73  < > 0.74  < > 1.0 1.0 1.1 0.9  CALCIUM 10.2 9.9  < > 9.9  < > 10.1 9.8 10.2 9.9  GFRNONAA  --   --   --  >60  --   --   --   --   --   GFRAA  --   --   --  >60  --   --   --   --   --   PROT 7.5 7.3  < >  --   < > 7.3  --  7.4 6.9  ALBUMIN 4.5 4.3  < >  --   < > 3.9  --  3.8 3.5  AST 16 15  < >  --   < > 16  --  14 15  ALT 11 9  < >  --   < > 12  --  9 9  ALKPHOS 72 72  < >  --   < > 80  --  84 78  BILITOT 0.5 0.4  < >  --   < > 0.39  --  0.33 0.28  < > = values in this interval not displayed.  ASSESSMENT & PLAN:  Ovarian cancer on right [facility]) She is recovering slowly from recent chemotherapy Her main issue has been not eating well. She has lost some weight since she was last seen We discussed supportive care measures She has imaging studies ordered next week I will see her back in 2 weeks to review test results Recent additional ER markers on previous tumor cells were positive; she is potentially a candidate for possible anti-estrogen therapy in the future  Iron deficiency anemia She has fatigue We dicussed potential  iv iron in the future if she cannot tolerate oral iron supplement  Anemia due to antineoplastic chemotherapy There is a component of anemia of chronic disease from recent chemotheraoy. The patient denies recent history of bleeding such as epistaxis, hematuria or hematochezia. She is asymptomatic from the anemia. We will observe for now.  She is taking oral iron supplement as directed  Chemotherapy-induced peripheral neuropathy (HCC) she has mild peripheral neuropathy, likely related to side effects of treatment and diabetic neuropathy It is only mild, not bothering the patient. I will observe for now  Controlled  type 2 diabetes mellitus without complication ([place]) She is using insulin as directed along with metformin I recommend holding metformin and her HCTZ/lisinopril the day before, day of and day after CT to reduce risk of contrast nephropathy  Malignant neoplasm metastatic to lung [facility]) She is not symptomatic with cough or dyspnea Will evaluate response to treatment with CT   Orders Placed This Encounter  Procedures  . Ferritin    Standing Status:   Future    Standing Expiration Date:   04/15/2017    All questions were answered. The patient knows to call the clinic with any problems, questions or concerns. I spent 40 minutes counseling the patient face to face. The total time spent in the appointment was 60 minutes and more than 50% was on counseling.     Heath Lark, MD 04/16/2016 3:04 AM

## 2016-04-16 NOTE — Assessment & Plan Note (Signed)
She is not symptomatic with cough or dyspnea Will evaluate response to treatment with CT

## 2016-04-22 ENCOUNTER — Other Ambulatory Visit (HOSPITAL_BASED_OUTPATIENT_CLINIC_OR_DEPARTMENT_OTHER): Payer: Medicare Other

## 2016-04-22 ENCOUNTER — Ambulatory Visit (HOSPITAL_BASED_OUTPATIENT_CLINIC_OR_DEPARTMENT_OTHER): Payer: Medicare Other | Admitting: Nurse Practitioner

## 2016-04-22 ENCOUNTER — Ambulatory Visit (HOSPITAL_COMMUNITY)
Admission: RE | Admit: 2016-04-22 | Discharge: 2016-04-22 | Disposition: A | Discharge status: Home / Self Care | Payer: Medicare Other | Source: Ambulatory Visit | Attending: Gynecologic Oncology | Admitting: Gynecologic Oncology

## 2016-04-22 VITALS — BP 143/87 | Temp 98.3°F | Resp 18 | Ht 67.0 in | Wt 138.5 lb

## 2016-04-22 DIAGNOSIS — C561 Malignant neoplasm of right ovary: Secondary | ICD-10-CM

## 2016-04-22 DIAGNOSIS — C569 Malignant neoplasm of unspecified ovary: Secondary | ICD-10-CM

## 2016-04-22 DIAGNOSIS — K869 Disease of pancreas, unspecified: Secondary | ICD-10-CM | POA: Insufficient documentation

## 2016-04-22 DIAGNOSIS — R918 Other nonspecific abnormal finding of lung field: Secondary | ICD-10-CM | POA: Diagnosis not present

## 2016-04-22 DIAGNOSIS — K8689 Other specified diseases of pancreas: Secondary | ICD-10-CM | POA: Diagnosis not present

## 2016-04-22 DIAGNOSIS — C7800 Secondary malignant neoplasm of unspecified lung: Secondary | ICD-10-CM

## 2016-04-22 DIAGNOSIS — C482 Malignant neoplasm of peritoneum, unspecified: Secondary | ICD-10-CM

## 2016-04-22 DIAGNOSIS — D509 Iron deficiency anemia, unspecified: Secondary | ICD-10-CM

## 2016-04-22 LAB — CBC WITH DIFFERENTIAL/PLATELET
BASO%: 0.2 % (ref 0.0–2.0)
Basophils Absolute: 0 10*3/uL (ref 0.0–0.1)
EOS ABS: 0.1 10*3/uL (ref 0.0–0.5)
EOS%: 1.1 % (ref 0.0–7.0)
HEMATOCRIT: 26.2 % — AB (ref 34.8–46.6)
HEMOGLOBIN: 8.4 g/dL — AB (ref 11.6–15.9)
LYMPH%: 33.3 % (ref 14.0–49.7)
MCH: 24.6 pg — ABNORMAL LOW (ref 25.1–34.0)
MCHC: 32.1 g/dL (ref 31.5–36.0)
MCV: 76.8 fL — AB (ref 79.5–101.0)
MONO#: 0.5 10*3/uL (ref 0.1–0.9)
MONO%: 9.5 % (ref 0.0–14.0)
NEUT%: 55.9 % (ref 38.4–76.8)
NEUTROS ABS: 2.7 10*3/uL (ref 1.5–6.5)
PLATELETS: 172 10*3/uL (ref 145–400)
RBC: 3.41 10*6/uL — ABNORMAL LOW (ref 3.70–5.45)
RDW: 17.8 % — AB (ref 11.2–14.5)
WBC: 4.7 10*3/uL (ref 3.9–10.3)
lymph#: 1.6 10*3/uL (ref 0.9–3.3)

## 2016-04-22 LAB — COMPREHENSIVE METABOLIC PANEL
ALBUMIN: 3.7 g/dL (ref 3.5–5.0)
ALK PHOS: 85 U/L (ref 40–150)
ALT: 9 U/L (ref 0–55)
AST: 15 U/L (ref 5–34)
Anion Gap: 11 mEq/L (ref 3–11)
BILIRUBIN TOTAL: 0.33 mg/dL (ref 0.20–1.20)
BUN: 16.1 mg/dL (ref 7.0–26.0)
CO2: 25 mEq/L (ref 22–29)
Calcium: 9.9 mg/dL (ref 8.4–10.4)
Chloride: 103 mEq/L (ref 98–109)
Creatinine: 0.8 mg/dL (ref 0.6–1.1)
EGFR: 84 mL/min/{1.73_m2} — AB (ref >90)
GLUCOSE: 170 mg/dL — AB (ref 70–140)
Potassium: 3.9 mEq/L (ref 3.5–5.1)
SODIUM: 139 meq/L (ref 136–145)
TOTAL PROTEIN: 7.1 g/dL (ref 6.4–8.3)

## 2016-04-22 LAB — FERRITIN: Ferritin: 394 ng/ml — ABNORMAL HIGH (ref 9–269)

## 2016-04-22 MED ORDER — SODIUM CHLORIDE 0.9% FLUSH
10.0000 mL | Freq: Once | INTRAVENOUS | Status: AC
  Administered 2016-04-22: 10 mL via INTRAVENOUS
  Filled 2016-04-22: qty 10

## 2016-04-22 MED ORDER — IOPAMIDOL (ISOVUE-300) INJECTION 61%
INTRAVENOUS | Status: AC
  Filled 2016-04-22: qty 100

## 2016-04-22 MED ORDER — SODIUM CHLORIDE 0.9 % IJ SOLN
INTRAMUSCULAR | Status: AC
  Filled 2016-04-22: qty 50

## 2016-04-22 MED ORDER — IOPAMIDOL (ISOVUE-300) INJECTION 61%
100.0000 mL | Freq: Once | INTRAVENOUS | Status: AC | PRN
  Administered 2016-04-22: 100 mL via INTRAVENOUS

## 2016-04-22 NOTE — Patient Instructions (Signed)

## 2016-04-22 NOTE — Progress Notes (Signed)
RN visit only for port access for labs and CT Scan

## 2016-04-27 NOTE — Progress Notes (Signed)
Consult Note: Gyn-Onc  Jake Michaelis 81 y.o. female  CC:  Chief Complaint  Patient presents with  . Ovarian Cancer    HPI: Patient was seen in consultation at the request of Dr. Pricilla Holm.  Patient is a very pleasant 81 year old gravida 3 para 2 who has been on a variety of different medications for her diabetes. Some of intermittently caused her to have some abdominal pain as well as gastroesophageal reflux symptoms. She was placed on Januvia in September of last year. Since that time she began noticing some abdominal soreness and discomfort. Gradually increased over the summer. She subsequently stopped taking the medication in July and she thinks that the soreness improved a little bit. However, she was seen by her physician in August at that time blood work was done and everything was "okay". The pain progressively increased and started moving to different areas in her abdomen. She will bring up merely noticed the pain at night when she was unable to lay on her side and one half to lay on her back. Concomitant with all that she does endorse some decreased appetite as well as early satiety. She denies any nausea or vomiting but she does have that felt like eating. She's had no change in her bladder habits but has noticed some increased constipation and occasionally some diarrhea. She is up-to-date on her colonoscopy having had her last one 5 years ago. She had her mammogram this year. Of note her sister is currently being treated for breast cancer here at the cancer center. She is 79 years old and is undergoing radiation therapy. She had a brother who had cancer of unknown primary.  She had a CT scan performed 9/8 that revealed: FINDINGS: -Lower chest: Numerous bibasilar pulmonary nodules highly suspicious for pulmonary metastases. Nodules measure up to 10 mm LEFT image 3 and 8 mm RIGHT image 5. Small RIGHT pleural effusion. Mild RIGHT basilar atelectasis. -Hepatobiliary: Nonspecific  RIGHT lobe liver lesion 11 x 7 mm image 25. Remainder of liver normal appearance. Gallbladder unremarkable. -Pancreas: Mild pancreatic udctal dilatation at tail with a subtle low-attenuation region at pancreatic tail cannot exclude mass 19 x 8 mm. Adjacent soft tissue likely tumor in lesser sac. -Spleen: Normal appearance -Adrenals/Urinary Tract: Adrenal thickening without discrete mass. Kidneys, ureters, and bladder unremarkable. -Stomach/Bowel: Diffuse colonic diverticulosis. Distal small bowel loops on opacified and under distended limiting assessment. Food debris and contrast in stomach. -Vascular/Lymphatic: Atherosclerotic calcifications aorta. Attenuation retrocrural nodes largest 16 mm short axis image 16. RIGHT cardiophrenic angle nodes largest 8 mm short axis image 9. 8 mm gastrohepatic ligament node image 16.  -Reproductive: Uterus surgically absent. Masslike enlargement in RIGHT adnexa question RIGHT ovarian neoplasm, solid and cystic, 5.8 x 3.7 x 4.7 cm. Additional predominantly cystic mass with smaller soft tissue component in LEFT adnexa likely LEFT ovarian origin 6.1 x 2.8 x 7.0 cm. Findings could represent ovarian neoplasms or tumor deposits at the ovaries from peritoneal carcinomatosis. Diffuse omental tumor infiltration up to 3 cm thick. Additional tumor deposits are seen in the pericolic gutters bilaterally. Large tumor deposit at the cul de sac at least 4.1 cm diameter. Probable additional tumor deposits along small bowel loops and colon in pelvis as well as at the pericolic gutters.  -Other: Small amount of perihepatic free fluid. BILATERAL inguinal hernias containing fat. No free air. IMPRESSION: -Multiple pulmonary nodules bilaterally compatible with pulmonary metastases. -Peritoneal carcinomatosis with tumor deposits throughout omentum andat the pericolic gutters as well as within the pelvis. -  BILATERAL cystic and solid ovarian masses, predominately cystic on LEFT and  predominately solid on RIGHT, question ovarian neoplasmsversus tumor deposits at the ovaries bilaterally from peritonealcarcinomatosis.  Low-attenuation retrocrural nodes.  Small RIGHT pleural effusion and minimal perihepatic ascites.  Question metastasis at pancreatic tail.  Diagnosis Soft Tissue Needle Core Biopsy, Peritoneal Cavity - ADENOCARCINOMA. - SEE MICROSCOPIC DESCRIPTION.  Microscopic Comment The morphologic features are compatible with metastatic ovarian cancer. There is sufficient material for additional studies if requested. (JDP:gt, 11/17/15)  CA-125 308.  Interval History:  His 3 cycles of neoadjuvant chemotherapy under the care of Dr. Darrold Span. Her CA 125 is down to 309 however she did peak at over 700 and October prior to starting chemotherapy. CT 02/04/16: CT CHEST IMPRESSION  1. Slight improvement in pulmonary metastasis. 2. Decrease in low left cervical/supraclavicular adenopathy, favoring response to therapy. 3. Decrease in small right pleural effusion.  CT ABDOMEN AND PELVIS IMPRESSION  1. Improvement in omental/peritoneal metastasis. 2. Enlarged cystic lesion within the left hemipelvis could represent a primary ovarian malignancy or metastasis. Right ovarian soft tissue fullness and heterogeneity is not significantly changed. 3. Pancreatic body/tail junction hypoattenuation is difficult to quantify secondary to morphology. However, felt to be increased in conspicuity today. Considerations include metastatic disease or a somewhat atypical distribution of pancreatic adenocarcinoma with metastasis. 4. Slight improvement in right common iliac high adenopathy. 5. Aortic atherosclerosis.  She has germline negative testing. Of course, somatic testing is still pending. In December 2017, I reviewed the CT scan with the patient and her daughter. While there are areas of improvement, stability, progression, overall the impression is that there is improvement in  her CT scan. Of note we need to remember that this CTs being compared to one that was over a month prior to her initiating chemotherapy.   Interval History: She is status post 6 cycles of chemotherapy and comes in today for follow-up. Her CA-125 has plateaued on February 15 at 109. She had a CT scan on April 22, 2016 that revealed:  IMPRESSION: 1. Today's study demonstrates a mixed response to therapy. Specifically, while the primary ovarian lesions and the omental caking has regressed compared to the prior examination, there is an increased number and size of pulmonary nodules, possible new hepatic lesion, enlarging lesion in the C7 vertebral body, and an enlarging lesion in the distal body of the pancreas. 2. Lesion in the distal body of the pancreas has nonspecific imaging characteristics. This could represent a metastatic lesion. Alternatively, a primary pancreatic neoplasm could be considered. In the appropriate clinical setting, the possibility of a benign lesion such as a pancreatic pseudocyst could be considered, particularly if there is a recent history of pancreatitis.  I reviewed her CT scan with her and her daughter lined by line. We discussed the increase in the number of pulmonary lesions. We specifically discussed the lesion in C7, more concerning pancreatic mass and the pelvic disease. Despite her CA-125 slowly coming down I would say that overall she has progressive disease and most worrisome is that this pattern does not appear to be classic for ovarian cancer. I offered them a series of options including: Obtaining a biopsy of the 3 cm pancreatic lesion to evaluate whether there is more than one primary process; radiation therapy palliative to the C7 lesion; changing chemotherapy to gemcitabine that would cover her disease whether we were dealing with a pancreatic primary in addition to her ovarian cancer or just all of metastatic ovarian cancer. Of note she did  have a biopsy that was  consistent with ovarian cancer; we discussed not proceeding with any treatment. Her daughter very much adamantly said that not proceeding with treatment was not an option.  The patient herself is not feeling particularly well. She has low grade abdominal discomfort, she has poor appetite and decreased energy. She has some discomfort which is relieved with Tylenol as well as her nausea medication. She's eating very little and primarily just eats cereal.   When the patient's daughter stepped out of the room for her exam the patient stated she went to know how serious her disease was. I told her that it is incurable and that I did not think she would survive beyond 6 months. She states that she is willing to move forward with palliative radiation in this cycle of chemotherapy with gemcitabine but most likely if the results are not promising with this regimen that she would most likely not want to continue with treatment.  Review of Systems: Constitutional: Denies fever. + Weight loss Skin: No rash Cardiovascular: No chest pain, shortness of breath, or edema  Pulmonary: No cough  Gastro Intestinal: Reporting intermittent lower abdominal soreness.  No nausea, vomiting, + constipation Genitourinary:   Denies vaginal bleeding and discharge except for one episode of vaginal spotting after voiding Musculoskeletal: No joint swelling or pain.  Neurologic: No weakness.  Psychology: Worried  Current Meds:  Outpatient Encounter Prescriptions as of 04/28/2016  Medication Sig  . dexamethasone (DECADRON) 4 MG tablet Take 5 tablets with food 12 hrs and 6 hrs prior to Taxol Chemotherapy.  Marland Kitchen HEMOCYTE 324 (106 Fe) MG TABS tablet Take 1 tablet (106 mg of iron total) by mouth daily.  . insulin lispro (HUMALOG KWIKPEN) 100 UNIT/ML KiwkPen Check sugars and if: 0-150 take no insulin, 151-250 take 2 units, 251-300 take 4 units, 301-400, take 6 units, >400 call office.  . lidocaine-prilocaine (EMLA) cream Apply to  PortaCath 1-2 hrs prior to access as directed to numb area.  Marland Kitchen lisinopril-hydrochlorothiazide (PRINZIDE,ZESTORETIC) 20-12.5 MG tablet take 1 tablet by mouth once daily  . loratadine (CLARITIN) 10 MG tablet Take 10 mg by mouth daily as needed for allergies.  Marland Kitchen LORazepam (ATIVAN) 0.5 MG tablet Place 1 tablet under the tongue or swallow every 6 hrs as needed for nausea. Will make drowsy.  . Magnesium Hydroxide (MILK OF MAGNESIA CONCENTRATE PO) Take 1 Dose by mouth daily as needed.  . Melatonin 3 MG CAPS Take 1 capsule by mouth at bedtime as needed.  . metFORMIN (GLUCOPHAGE) 1000 MG tablet take 1 tablet by mouth twice a day with food  . multivitamin (THERAGRAN) tablet Take 1 tablet by mouth daily.    . ondansetron (ZOFRAN) 8 MG tablet Take 1 tablet (8 mg total) by mouth every 8 (eight) hours as needed for nausea or vomiting (Will not make drowsy.).  Marland Kitchen polyethylene glycol (MIRALAX / GLYCOLAX) packet Take 17 g by mouth 2 (two) times daily as needed.  . simvastatin (ZOCOR) 40 MG tablet take 1 tablet by mouth at bedtime (Patient not taking: Reported on 04/15/2016)   Facility-Administered Encounter Medications as of 04/28/2016  Medication  . 0.9 %  sodium chloride infusion    Allergy:  Allergies  Allergen Reactions  . Codeine Nausea And Vomiting    Social Hx:   Social History   Social History  . Marital status: Divorced    Spouse name: N/A  . Number of children: 2  . Years of education: 12   Occupational History  .  retired Astronomer [employer]   Social History Main Topics  . Smoking status: Former Smoker    Packs/day: 0.25    Years: 7.00    Types: Cigarettes    Quit date: 03/01/1978  . Smokeless tobacco: Never Used     Comment: smoked 3-5 cigs per day for 7-8 yrs  . Alcohol use Not on file  . Drug use: Unknown  . Sexual activity: Not Currently   Other Topics Concern  . Not on file   Social History Narrative   HSG. Married '60 - 71yr/divorced. Married '80's - 89yrs/divorced. 1 dtr  - '70, 1 son - '64. 2 grandchildren. Retired - [employer]. Lives in own home, son is autistic and lives with her. Staying busy.              Past Surgical Hx:  Past Surgical History:  Procedure Laterality Date  . ABDOMINAL HYSTERECTOMY     total  . IR GENERIC HISTORICAL  01/01/2016   IR [place] VASC ACCESS RIGHT 01/01/2016 MGreggory Keen MD [facility]  . IR GENERIC HISTORICAL  01/01/2016   IR FLUORO GUIDE PORT INSERTION RIGHT 01/01/2016 MGreggory Keen MD [facility]    Past Medical Hx:  Past Medical History:  Diagnosis Date  . Diabetes mellitus    type II, mild  . Hyperlipidemia   . Hypertension    borderline    Oncology Hx:    Primary peritoneal carcinomatosis ([facility]   11/14/2015 Initial Diagnosis    Primary peritoneal carcinomatosis ([facility]. Stage IV based on pulmonary nodules.       Ovarian cancer on right ([facility]   11/07/2015 Imaging    Multiple pulmonary nodules bilaterally compatible with pulmonary metastases. Peritoneal carcinomatosis with tumor deposits throughout omentum and at the pericolic gutters as well as within the pelvis. BILATERAL cystic and solid ovarian masses, predominately cystic on LEFT and predominately solid on RIGHT, question ovarian neoplasms versus tumor deposits at the ovaries bilaterally from peritoneal carcinomatosis. Low-attenuation retrocrural nodes. Small RIGHT pleural effusion and minimal perihepatic ascites. Question metastasis at pancreatic tail. Sigmoid diverticulosis. BILATERAL inguinal hernias containing fat. Aortic atherosclerosis.       11/12/2015 Tumor Marker    Patient's tumor was tested for the following markers: CA125. Results of the tumor marker test revealed 301.8.      11/14/2015 Procedure    Peritoneal tumor just deep to the right abdominal wall was targeted. A tangential approach was performed with needle insertion to the left of midline. Three core biopsy needle passes resulted in 2 intact core biopsy specimens and 1 partial  tissue fragment.  IMPRESSION: CT-guided core biopsy performed of peritoneal tumor.      11/14/2015 Pathology Results    Soft Tissue Needle Core Biopsy, Peritoneal Cavity - ADENOCARCINOMA. - SEE MICROSCOPIC DESCRIPTION. Microscopic Comment The morphologic features are compatible with metastatic ovarian cancer. There is sufficient material for additional studies if requested. (JDP:gt, 11/17/15) PROGNOSTIC INDICATORS Results: IMMUNOHISTOCHEMICAL AND MORPHOMETRIC ANALYSIS PERFORMED MANUALLY Estrogen Receptor: 80%, POSITIVE, STRONG STAINING INTENSITY Progesterone Receptor: 10%, POSITIVE, STRONG STAINING INTENSITY REFERENCE RANGE ESTROGEN RECEPTOR NEGATIVE 0% POSITIVE =>1% REFERENCE RANGE PROGESTERONE RECEPTOR NEGATIVE 0% POSITIVE =>1% All controls stained appropriately      12/05/2015 Tumor Marker    Patient's tumor was tested for the following markers: CA125 Results of the tumor marker test revealed 469.2      12/10/2015 Imaging    Innumerable pulmonary nodules of variable size compatible with diffuse pulmonary metastatic disease. Nodules appear minimally larger and there are some  new tiny pulmonary nodules in the lung bases compared to 11/07/2015. 2. Moderate and enlarging right pleural effusion. 3. Extensive peritoneal carcinomatosis in the upper abdomen. Possible tumor involving the pancreatic tail. 4. Borderline prominent right hilar lymph node.      12/12/2015 - 03/26/2016 Chemotherapy    She has received 6 cycles of carboplatin & taxol       12/25/2015 Tumor Marker    Patient's tumor was tested for the following markers: CA125 Results of the tumor marker test revealed 739.7      01/01/2016 Procedure    Ultrasound and fluoroscopically guided right internal jugular single lumen power port catheter insertion. Tip in the SVC/RA junction. Catheter ready for use      01/19/2016 Tumor Marker    Patient's tumor was tested for the following markers: CA125 Results of the tumor  marker test revealed 421.9      02/04/2016 Imaging    CT CHEST IMPRESSION  1. Slight improvement in pulmonary metastasis. 2. Decrease in low left cervical/supraclavicular adenopathy, favoring response to therapy. 3. Decrease in small right pleural effusion.  CT ABDOMEN AND PELVIS IMPRESSION  1. Improvement in omental/peritoneal metastasis. 2. Enlarged cystic lesion within the left hemipelvis could represent a primary ovarian malignancy or metastasis. Right ovarian soft tissue fullness and heterogeneity is not significantly changed. 3. Pancreatic body/tail junction hypoattenuation is difficult to quantify secondary to morphology. However, felt to be increased in conspicuity today. Considerations include metastatic disease or a somewhat atypical distribution of pancreatic adenocarcinoma with metastasis. 4. Slight improvement in right common iliac high adenopathy. 5. Aortic atherosclerosis.       02/04/2016 Genetic Testing    Genetics testing normal 02-04-16 (298 gene myRisk panel by [employer]). Tumor BRACAnalysis and Genomic Instability Status Testing was also performed through [employer], negative      02/05/2016 Tumor Marker    Patient's tumor was tested for the following markers: CA125 Results of the tumor marker test revealed 307.1      03/04/2016 Tumor Marker    Patient's tumor was tested for the following markers: CA125 Results of the tumor marker test revealed 180.9      03/26/2016 Tumor Marker    Patient's tumor was tested for the following markers: CA125 Results of the tumor marker test revealed 132.8      04/15/2016 Tumor Marker    Patient's tumor was tested for the following markers: CA125 Results of the tumor marker test revealed 109.4      04/22/2016 Imaging    Today's study demonstrates a mixed response to therapy. Specifically, while the primary ovarian lesions and the omental caking has regressed compared to the prior examination, there is an  increased number and size of pulmonary nodules, possible new hepatic lesion, enlarging lesion in the C7 vertebral body, and an enlarging lesion in the distal body of the pancreas. 2. Lesion in the distal body of the pancreas has nonspecific imaging characteristics. This could represent a metastatic lesion. Alternatively, a primary pancreatic neoplasm could be considered. In the appropriate clinical setting, the possibility of a benign lesion such as a pancreatic pseudocyst could be considered, particularly if there is a recent history of pancreatitis.       Family Hx:  Family History  Problem Relation Age of Onset  . Hypertension Mother   . Other Mother     CVa  . Stroke Mother   . Cancer Brother     NOS cancer w/ metastasis; d. 73s  . Other Son  Autistic  . Hypertension Father   . Stroke Father   . Hypertension Sister   . Breast cancer Sister 52  . Ovarian cysts Sister     TAH-BSO for benign ovarian cyst  . Seizures Brother     d. 43  . Diabetes Brother   . Prostate cancer Brother     dx "awhile ago"  . Cancer Cousin     maternal 1st cousin, once-removed dx NOS cancer in his late 72s-early 55s    Vitals:  Blood pressure 122/70, pulse 98, temperature 97.9 F (36.6 C), temperature source Oral, resp. rate 18, height '5\' 7"'$  (1.702 m), weight 138 lb 8 oz (62.8 kg), SpO2 99 %.  Physical Exam: Well-nourished older white female in no acute distress.  Abdomen: Well-healed vertical midline incision with no evidence of an incisional hernia.  Abdomen is soft slightly distended. Nontender. There is no rebound or guarding. There is no distinct masses.  Pelvic: External genitalia within normal limits. The cervix is palpably normal. There is approximately 7 cm adnexal mass on the left that is somewhat tender. I cannot appreciate any right adnexal masses.  Assessment/Plan: 81 year old gravida 3 para 2 with Stage IV primary peritoneal carcinoma who is currently treated with a neoadjuvant  approach. She is status post 6 cycles of chemotherapy and had imaging after cycle #6 which is reviewed above. Unfortunately she has progressive disease despite the fact that her CA-125 was somewhat reassuring. There are several issues that we need to address. I discussed with them considering proceeding with a biopsy to ensure that were not dealing with more than one primary. They declined as if we treated her with gemcitabine, this would treat a potential pancreatic primary as well as ovarian cancer. Discussed the need to proceed with radiation for the C7 lesion. Most likely she would also benefit from a bisphosphonate.  I placed a stat radiation oncology consultation for her to be seen. Depending on the number of cycles for radiation treatment that she'll require weekly will be able to plan a when to start her gemcitabine. This most usual risks and side effects of gemcitabine were discussed with the patient and her daughter and presented with information for this.  The mother and I spoke after the daughter left the room. She understands that this is not a curable disease and that her life expectancy is most likely no more than 6 months. At this point she is comfortable moving forward with both radiation therapy and gemcitabine. But if after the next 3 cycles of gemcitabine CT scan shows progression or she starts to feel worse she would like to consider palliative care options. I will communicate this information to Dr. Alvy Bimler.  The patient's questions as well as those of her daughter were elicited in answer to their satisfaction.  Greater than 40 minutes face-to-face time was spent with the patient and the daughter of which greater than 50% of the time was in consultation.    Quency Tober A., MD 04/28/2016, 11:26 AM

## 2016-04-28 ENCOUNTER — Other Ambulatory Visit: Payer: Self-pay | Admitting: Gynecologic Oncology

## 2016-04-28 ENCOUNTER — Encounter: Payer: Self-pay | Admitting: Gynecologic Oncology

## 2016-04-28 ENCOUNTER — Telehealth: Payer: Self-pay | Admitting: Hematology and Oncology

## 2016-04-28 ENCOUNTER — Ambulatory Visit (HOSPITAL_BASED_OUTPATIENT_CLINIC_OR_DEPARTMENT_OTHER): Payer: Medicare Other | Admitting: Hematology and Oncology

## 2016-04-28 ENCOUNTER — Encounter: Payer: Self-pay | Admitting: Radiation Oncology

## 2016-04-28 ENCOUNTER — Ambulatory Visit: Payer: Medicare Other | Attending: Gynecologic Oncology | Admitting: Gynecologic Oncology

## 2016-04-28 VITALS — BP 122/70 | HR 98 | Temp 97.9°F | Resp 18 | Ht 67.0 in | Wt 138.5 lb

## 2016-04-28 DIAGNOSIS — C7951 Secondary malignant neoplasm of bone: Secondary | ICD-10-CM | POA: Diagnosis not present

## 2016-04-28 DIAGNOSIS — J90 Pleural effusion, not elsewhere classified: Secondary | ICD-10-CM | POA: Diagnosis not present

## 2016-04-28 DIAGNOSIS — E119 Type 2 diabetes mellitus without complications: Secondary | ICD-10-CM | POA: Insufficient documentation

## 2016-04-28 DIAGNOSIS — Z87891 Personal history of nicotine dependence: Secondary | ICD-10-CM | POA: Diagnosis not present

## 2016-04-28 DIAGNOSIS — C561 Malignant neoplasm of right ovary: Secondary | ICD-10-CM

## 2016-04-28 DIAGNOSIS — C786 Secondary malignant neoplasm of retroperitoneum and peritoneum: Secondary | ICD-10-CM | POA: Insufficient documentation

## 2016-04-28 DIAGNOSIS — K869 Disease of pancreas, unspecified: Secondary | ICD-10-CM

## 2016-04-28 DIAGNOSIS — C7889 Secondary malignant neoplasm of other digestive organs: Secondary | ICD-10-CM | POA: Diagnosis not present

## 2016-04-28 DIAGNOSIS — C569 Malignant neoplasm of unspecified ovary: Secondary | ICD-10-CM | POA: Diagnosis present

## 2016-04-28 DIAGNOSIS — C7800 Secondary malignant neoplasm of unspecified lung: Secondary | ICD-10-CM

## 2016-04-28 DIAGNOSIS — C482 Malignant neoplasm of peritoneum, unspecified: Secondary | ICD-10-CM

## 2016-04-28 DIAGNOSIS — R188 Other ascites: Secondary | ICD-10-CM | POA: Diagnosis not present

## 2016-04-28 DIAGNOSIS — D6481 Anemia due to antineoplastic chemotherapy: Secondary | ICD-10-CM

## 2016-04-28 DIAGNOSIS — T451X5A Adverse effect of antineoplastic and immunosuppressive drugs, initial encounter: Secondary | ICD-10-CM

## 2016-04-28 DIAGNOSIS — R918 Other nonspecific abnormal finding of lung field: Secondary | ICD-10-CM | POA: Diagnosis not present

## 2016-04-28 DIAGNOSIS — Z8543 Personal history of malignant neoplasm of ovary: Secondary | ICD-10-CM | POA: Insufficient documentation

## 2016-04-28 DIAGNOSIS — Z794 Long term (current) use of insulin: Secondary | ICD-10-CM | POA: Diagnosis not present

## 2016-04-28 DIAGNOSIS — Z7189 Other specified counseling: Secondary | ICD-10-CM

## 2016-04-28 NOTE — Patient Instructions (Signed)

## 2016-04-28 NOTE — Telephone Encounter (Signed)
No new orders at time of check out.

## 2016-04-28 NOTE — Progress Notes (Signed)
Histology and Location of Primary Cancer: Ovarian Cancer  Location(s) of Symptomatic Metastases: C7  Past/Anticipated chemotherapy by medical oncology, if any: 3 cycles of gemcitabine   Pain on a scale of 0-10 is: 4 - has pain/pressure in her abdomen.  She reports having new pain in her left hip that started today.    If Spine Met(s), symptoms, if any, include:  Bowel/Bladder retention or incontinence (please describe): had constipation from taking Iron.  Numbness or weakness in extremities (please describe): has neuropathy in her fingers.  Current Decadron regimen, if applicable: none  Ambulatory status? Walker? Wheelchair?: ambulatory  SAFETY ISSUES:  Prior radiation? no  Pacemaker/ICD? no  Possible current pregnancy? no  Is the patient on methotrexate? no  Current Complaints / other details:  Patient is here with her daughter.  BP 126/76 (BP Location: Left Arm, Patient Position: Sitting)   Pulse 93   Temp 98.2 F (36.8 C) (Oral)   Ht _0  (1.702 m)   Wt 136 lb 9.6 oz (62 kg)   SpO2 100%   BMI 21.39 kg/m    Wt Readings from Last 3 Encounters:  04/29/16 136 lb 9.6 oz (62 kg)  04/28/16 138 lb 8 oz (62.8 kg)  04/22/16 138 lb 8 oz (62.8 kg)

## 2016-04-29 ENCOUNTER — Encounter: Payer: Self-pay | Admitting: Radiation Oncology

## 2016-04-29 ENCOUNTER — Encounter: Payer: Self-pay | Admitting: Hematology and Oncology

## 2016-04-29 ENCOUNTER — Ambulatory Visit
Admission: RE | Admit: 2016-04-29 | Discharge: 2016-04-29 | Disposition: A | Discharge status: Home / Self Care | Payer: Medicare Other | Source: Ambulatory Visit | Attending: Radiation Oncology | Admitting: Radiation Oncology

## 2016-04-29 VITALS — BP 126/76 | HR 93 | Temp 98.2°F | Ht 67.0 in | Wt 136.6 lb

## 2016-04-29 DIAGNOSIS — C7951 Secondary malignant neoplasm of bone: Secondary | ICD-10-CM | POA: Insufficient documentation

## 2016-04-29 DIAGNOSIS — Z510 Encounter for antineoplastic radiation therapy: Secondary | ICD-10-CM | POA: Insufficient documentation

## 2016-04-29 DIAGNOSIS — Z7189 Other specified counseling: Secondary | ICD-10-CM | POA: Insufficient documentation

## 2016-04-29 DIAGNOSIS — Z9221 Personal history of antineoplastic chemotherapy: Secondary | ICD-10-CM | POA: Diagnosis not present

## 2016-04-29 DIAGNOSIS — C569 Malignant neoplasm of unspecified ovary: Secondary | ICD-10-CM | POA: Diagnosis present

## 2016-04-29 DIAGNOSIS — R918 Other nonspecific abnormal finding of lung field: Secondary | ICD-10-CM | POA: Insufficient documentation

## 2016-04-29 DIAGNOSIS — K869 Disease of pancreas, unspecified: Secondary | ICD-10-CM | POA: Insufficient documentation

## 2016-04-29 HISTORY — DX: Malignant neoplasm of unspecified ovary: C56.9

## 2016-04-29 NOTE — Assessment & Plan Note (Signed)
The patient is aware she has incurable disease and treatment is strictly palliative. Despite significant prior treatment and disease progression, the patient wants to pursue further treatment.

## 2016-04-29 NOTE — Assessment & Plan Note (Signed)
She denies painful bone lesion.  CT demonstrated T7 involvement. Radiation oncology consultation is pending. She may benefit from future IV bisphosphonate and I would discuss this further with the patient in the next visit.  She will need dental clearance prior to IV bisphosphonate therapy.

## 2016-04-29 NOTE — Assessment & Plan Note (Signed)
She has clear evidence of disease progression but remained asymptomatic.  After radiation therapy is completed, we will begin systemic treatment for this.

## 2016-04-29 NOTE — Progress Notes (Signed)
ON PATHWAY REGIMEN - Ovarian  No Change  Continue With Treatment as Ordered.     A cycle is every 21 days:     Paclitaxel        Dose Mod: None     Carboplatin        Dose Mod: None  **Always confirm dose/schedule in your pharmacy ordering system**    Patient Characteristics: Neoadjuvant Therapy (No Primary Cytoreductive Surgery) Stage Grouping: IV AJCC T Stage: X AJCC N Stage: X AJCC M Stage: X Line of Therapy: Neoadjuvant Therapy BRCA Mutation Status: Awaiting Test Results Would you be surprised if this patient died  in the next year? I would be surprised if this patient died in the next year  Intent of Therapy: Non-Curative / Palliative Intent, Discussed with Patient

## 2016-04-29 NOTE — Progress Notes (Signed)
[facility] OFFICE PROGRESS NOTE  Patient Care Team: Hoyt Koch, MD as PCP - General (Internal Medicine) Fredderick Erb, MD as Consulting Physician (Ophthalmology)  SUMMARY OF ONCOLOGIC HISTORY:   Primary peritoneal carcinomatosis ([place])   11/14/2015 Initial Diagnosis    Primary peritoneal carcinomatosis ([place]). Stage IV based on pulmonary nodules.       Ovarian cancer on right [facility])   11/07/2015 Imaging    Multiple pulmonary nodules bilaterally compatible with pulmonary metastases. Peritoneal carcinomatosis with tumor deposits throughout omentum and at the pericolic gutters as well as within the pelvis. BILATERAL cystic and solid ovarian masses, predominately cystic on LEFT and predominately solid on RIGHT, question ovarian neoplasms versus tumor deposits at the ovaries bilaterally from peritoneal carcinomatosis. Low-attenuation retrocrural nodes. Small RIGHT pleural effusion and minimal perihepatic ascites. Question metastasis at pancreatic tail. Sigmoid diverticulosis. BILATERAL inguinal hernias containing fat. Aortic atherosclerosis.       11/12/2015 Tumor Marker    Patient's tumor was tested for the following markers: CA125. Results of the tumor marker test revealed 301.8.      11/14/2015 Procedure    Peritoneal tumor just deep to the right abdominal wall was targeted. A tangential approach was performed with needle insertion to the left of midline. Three core biopsy needle passes resulted in 2 intact core biopsy specimens and 1 partial tissue fragment.  IMPRESSION: CT-guided core biopsy performed of peritoneal tumor.      11/14/2015 Pathology Results    Soft Tissue Needle Core Biopsy, Peritoneal Cavity - ADENOCARCINOMA. - SEE MICROSCOPIC DESCRIPTION. Microscopic Comment The morphologic features are compatible with metastatic ovarian cancer. There is sufficient material for additional studies if requested. (JDP:gt, 11/17/15) PROGNOSTIC  INDICATORS Results: IMMUNOHISTOCHEMICAL AND MORPHOMETRIC ANALYSIS PERFORMED MANUALLY Estrogen Receptor: 80%, POSITIVE, STRONG STAINING INTENSITY Progesterone Receptor: 10%, POSITIVE, STRONG STAINING INTENSITY REFERENCE RANGE ESTROGEN RECEPTOR NEGATIVE 0% POSITIVE =>1% REFERENCE RANGE PROGESTERONE RECEPTOR NEGATIVE 0% POSITIVE =>1% All controls stained appropriately      12/05/2015 Tumor Marker    Patient's tumor was tested for the following markers: CA125 Results of the tumor marker test revealed 469.2      12/10/2015 Imaging    Innumerable pulmonary nodules of variable size compatible with diffuse pulmonary metastatic disease. Nodules appear minimally larger and there are some new tiny pulmonary nodules in the lung bases compared to 11/07/2015. 2. Moderate and enlarging right pleural effusion. 3. Extensive peritoneal carcinomatosis in the upper abdomen. Possible tumor involving the pancreatic tail. 4. Borderline prominent right hilar lymph node.      12/12/2015 - 03/26/2016 Chemotherapy    She has received 6 cycles of carboplatin & taxol       12/25/2015 Tumor Marker    Patient's tumor was tested for the following markers: CA125 Results of the tumor marker test revealed 739.7      01/01/2016 Procedure    Ultrasound and fluoroscopically guided right internal jugular single lumen power port catheter insertion. Tip in the SVC/RA junction. Catheter ready for use      01/19/2016 Tumor Marker    Patient's tumor was tested for the following markers: CA125 Results of the tumor marker test revealed 421.9      02/04/2016 Imaging    CT CHEST IMPRESSION  1. Slight improvement in pulmonary metastasis. 2. Decrease in low left cervical/supraclavicular adenopathy, favoring response to therapy. 3. Decrease in small right pleural effusion.  CT ABDOMEN AND PELVIS IMPRESSION  1. Improvement in omental/peritoneal metastasis. 2. Enlarged cystic lesion within the left hemipelvis could  represent a primary ovarian malignancy or metastasis. Right ovarian soft tissue fullness and heterogeneity is not significantly changed. 3. Pancreatic body/tail junction hypoattenuation is difficult to quantify secondary to morphology. However, felt to be increased in conspicuity today. Considerations include metastatic disease or a somewhat atypical distribution of pancreatic adenocarcinoma with metastasis. 4. Slight improvement in right common iliac high adenopathy. 5. Aortic atherosclerosis.       02/04/2016 Genetic Testing    Genetics testing normal 02-04-16 (298 gene myRisk panel by [employer]). Tumor BRACAnalysis and Genomic Instability Status Testing was also performed through [employer], negative      02/05/2016 Tumor Marker    Patient's tumor was tested for the following markers: CA125 Results of the tumor marker test revealed 307.1      03/04/2016 Tumor Marker    Patient's tumor was tested for the following markers: CA125 Results of the tumor marker test revealed 180.9      03/26/2016 Tumor Marker    Patient's tumor was tested for the following markers: CA125 Results of the tumor marker test revealed 132.8      04/15/2016 Tumor Marker    Patient's tumor was tested for the following markers: CA125 Results of the tumor marker test revealed 109.4      04/22/2016 Imaging    Today's study demonstrates a mixed response to therapy. Specifically, while the primary ovarian lesions and the omental caking has regressed compared to the prior examination, there is an increased number and size of pulmonary nodules, possible new hepatic lesion, enlarging lesion in the C7 vertebral body, and an enlarging lesion in the distal body of the pancreas. 2. Lesion in the distal body of the pancreas has nonspecific imaging characteristics. This could represent a metastatic lesion. Alternatively, a primary pancreatic neoplasm could be considered. In the appropriate clinical setting, the  possibility of a benign lesion such as a pancreatic pseudocyst could be considered, particularly if there is a recent history of pancreatitis.       INTERVAL HISTORY: Please see below for problem oriented charting. She returns with her daughter to discuss plan of care. She has met with gynecologist oncologist related to goals of care. The patient  once to pursue further systemic treatment. She denies chest pain or shortness of breath.  Denies painful bone lesions. The patient denies any recent signs or symptoms of bleeding such as spontaneous epistaxis, hematuria or hematochezia.  REVIEW OF SYSTEMS:   Constitutional: Denies fevers, chills or abnormal weight loss Eyes: Denies blurriness of vision Ears, nose, mouth, throat, and face: Denies mucositis or sore throat Respiratory: Denies cough, dyspnea or wheezes Cardiovascular: Denies palpitation, chest discomfort or lower extremity swelling Gastrointestinal:  Denies nausea, heartburn or change in bowel habits Skin: Denies abnormal skin rashes Lymphatics: Denies new lymphadenopathy or easy bruising Neurological:Denies numbness, tingling or new weaknesses Behavioral/Psych: Mood is stable, no new changes  All other systems were reviewed with the patient and are negative.  I have reviewed the past medical history, past surgical history, social history and family history with the patient and they are unchanged from previous note.  ALLERGIES:  is allergic to codeine.  MEDICATIONS:  Current Outpatient Prescriptions  Medication Sig Dispense Refill  . dexamethasone (DECADRON) 4 MG tablet Take 5 tablets with food 12 hrs and 6 hrs prior to Taxol Chemotherapy. 30 tablet 0  . HEMOCYTE 324 (106 Fe) MG TABS tablet Take 1 tablet (106 mg of iron total) by mouth daily. 30 tablet 2  . insulin lispro (HUMALOG KWIKPEN)  100 UNIT/ML KiwkPen Check sugars and if: 0-150 take no insulin, 151-250 take 2 units, 251-300 take 4 units, 301-400, take 6 units, >400  call office. 15 mL 11  . lidocaine-prilocaine (EMLA) cream Apply to PortaCath 1-2 hrs prior to access as directed to numb area. 30 g 1  . lisinopril-hydrochlorothiazide (PRINZIDE,ZESTORETIC) 20-12.5 MG tablet take 1 tablet by mouth once daily 90 tablet 3  . loratadine (CLARITIN) 10 MG tablet Take 10 mg by mouth daily as needed for allergies.    Marland Kitchen LORazepam (ATIVAN) 0.5 MG tablet Place 1 tablet under the tongue or swallow every 6 hrs as needed for nausea. Will make drowsy. 30 tablet 0  . Magnesium Hydroxide (MILK OF MAGNESIA CONCENTRATE PO) Take 1 Dose by mouth daily as needed.    . Melatonin 3 MG CAPS Take 1 capsule by mouth at bedtime as needed.    . metFORMIN (GLUCOPHAGE) 1000 MG tablet take 1 tablet by mouth twice a day with food 180 tablet 3  . multivitamin (THERAGRAN) tablet Take 1 tablet by mouth daily.      . ondansetron (ZOFRAN) 8 MG tablet Take 1 tablet (8 mg total) by mouth every 8 (eight) hours as needed for nausea or vomiting (Will not make drowsy.). 30 tablet 1  . polyethylene glycol (MIRALAX / GLYCOLAX) packet Take 17 g by mouth 2 (two) times daily as needed.    . simvastatin (ZOCOR) 40 MG tablet take 1 tablet by mouth at bedtime (Patient not taking: Reported on 04/15/2016) 90 tablet 3   No current facility-administered medications for this visit.    Facility-Administered Medications Ordered in Other Visits  Medication Dose Route Frequency Provider Last Rate Last Dose  . 0.9 %  sodium chloride infusion   Intravenous Continuous Jacqulynn Cadet, MD        PHYSICAL EXAMINATION: ECOG PERFORMANCE STATUS: 1 - Symptomatic but completely ambulatory GENERAL:alert, no distress and comfortable.  She looks thin and cachectic SKIN: skin color, texture, turgor are normal, no rashes or significant lesions EYES: normal, Conjunctiva are pink and non-injected, sclera clear Musculoskeletal:no cyanosis of digits and no clubbing  NEURO: alert & oriented x 3 with fluent speech, no focal  motor/sensory deficits  LABORATORY DATA:  I have reviewed the data as listed    Component Value Date/Time   NA 139 04/22/2016 0929   K 3.9 04/22/2016 0929   CL 102 01/01/2016 1007   CO2 25 04/22/2016 0929   GLUCOSE 170 (H) 04/22/2016 0929   BUN 16.1 04/22/2016 0929   CREATININE 0.8 04/22/2016 0929   CALCIUM 9.9 04/22/2016 0929   PROT 7.1 04/22/2016 0929   ALBUMIN 3.7 04/22/2016 0929   AST 15 04/22/2016 0929   ALT 9 04/22/2016 0929   ALKPHOS 85 04/22/2016 0929   BILITOT 0.33 04/22/2016 0929   GFRNONAA >60 01/01/2016 1007   GFRAA >60 01/01/2016 1007    No results found for: SPEP, UPEP  Lab Results  Component Value Date   WBC 4.7 04/22/2016   NEUTROABS 2.7 04/22/2016   HGB 8.4 (L) 04/22/2016   HCT 26.2 (L) 04/22/2016   MCV 76.8 (L) 04/22/2016   PLT 172 04/22/2016      Chemistry      Component Value Date/Time   NA 139 04/22/2016 0929   K 3.9 04/22/2016 0929   CL 102 01/01/2016 1007   CO2 25 04/22/2016 0929   BUN 16.1 04/22/2016 0929   CREATININE 0.8 04/22/2016 0929   GLU 191 (H) 02/13/2016 1448  Component Value Date/Time   CALCIUM 9.9 04/22/2016 0929   ALKPHOS 85 04/22/2016 0929   AST 15 04/22/2016 0929   ALT 9 04/22/2016 0929   BILITOT 0.33 04/22/2016 0929       RADIOGRAPHIC STUDIES: I have personally reviewed the radiological images as listed and agreed with the findings in the report. Ct Chest W Contrast  Result Date: 04/22/2016 CLINICAL DATA:  81 year old female with history of ovarian cancer diagnosed in September 2017. Ongoing chemotherapy. No current complaints. EXAM: CT CHEST, ABDOMEN, AND PELVIS WITH CONTRAST TECHNIQUE: Multidetector CT imaging of the chest, abdomen and pelvis was performed following the standard protocol during bolus administration of intravenous contrast. CONTRAST:  114m ISOVUE-300 IOPAMIDOL (ISOVUE-300) INJECTION 61% COMPARISON:  CT the chest, abdomen and pelvis 02/04/2016. FINDINGS: CT CHEST FINDINGS Cardiovascular: Heart  size is normal. There is no significant pericardial fluid, thickening or pericardial calcification. There is aortic atherosclerosis, as well as atherosclerosis of the great vessels of the mediastinum and the coronary arteries, including calcified atherosclerotic plaque in the left anterior descending and right coronary arteries. Right internal jugular single-lumen porta cath with tip terminating in the right atrium. Mediastinum/Nodes: Right hilar lymph node measuring up to 12 mm in short axis. No mediastinal lymphadenopathy. Esophagus is unremarkable in appearance. No axillary lymphadenopathy. Lungs/Pleura: Again noted are innumerable pulmonary nodules, many of which are centrally cavitary. These nodules appear stable and/or increased in size compared to the prior examination, but there are clearly many more new nodules than seen on the prior study. The largest single nodule on today's examination currently measures 18 x 11 mm in the superior segment of the left lower lobe (image 55 of series 4) previously 17 x 7 mm on 02/04/2016. No confluent consolidative airspace disease. Trace right pleural effusion lying dependently, slightly smaller than the prior study. Musculoskeletal: 17 mm sclerotic lesion in the C7 vertebral body slightly to the left of midline, increasing in size compared to the prior study, compatible with a metastatic lesion. CT ABDOMEN PELVIS FINDINGS Hepatobiliary: 11 mm lesion in segment 7 is similar in size to the prior study, previously characterized as a cavernous hemangioma. Other previously noted 9 mm lesion in segment 6 appears hypervascular on today's study, previously low-attenuation, presumably a small cavernous hemangioma. New ill-defined hypovascular lesion in segment 4B measuring 9 mm (image 66 of series 2) is indeterminate, but suspicious for potential metastasis. No intra or extrahepatic biliary ductal dilatation. Gallbladder is normal in appearance. Pancreas: Enlarging intermediate  attenuation lesion with peripheral rim enhancement in the body of the pancreas (image 60 of series 2) measuring 3.0 x 2.6 cm, with distal ductal dilatation throughout the remaining body and tail of the pancreas. Spleen: Unremarkable. Adrenals/Urinary Tract: Bilateral kidneys and bilateral adrenal glands are normal in appearance. There is no hydroureteronephrosis. Urinary bladder is normal in appearance. Stomach/Bowel: Normal appearance of the stomach. No pathologic dilatation of small bowel or colon. Normal appendix. Vascular/Lymphatic: Aortic atherosclerosis, without evidence of aneurysm or dissection in the abdominal or pelvic vasculature. No lymphadenopathy noted in the abdomen or pelvis. Reproductive: Status post hysterectomy. Bilateral cystic ovarian masses (left greater than right) appear slightly smaller when compared to the prior examination. Right ovarian mass currently measures 4.3 x 2.7 x 2.8 cm. Left ovarian mass currently measures 6.5 x 4.2 x 8.4 cm. Other: Omental caking noted on the prior study is less pronounced on today's examination, and is best visualized in the low anatomic pelvis where it measures up to 9 mm in thickness. No significant  volume of ascites. No pneumoperitoneum. Well-defined low-attenuation lesion beneath the crus of the right hemidiaphragm, similar appearance to prior studies, favored to be a benign lesion (likely a mildly dilated cisterna chyli). Musculoskeletal: There are no aggressive appearing lytic or blastic lesions noted in the visualized portions of the skeleton. IMPRESSION: 1. Today's study demonstrates a mixed response to therapy. Specifically, while the primary ovarian lesions and the omental caking has regressed compared to the prior examination, there is an increased number and size of pulmonary nodules, possible new hepatic lesion, enlarging lesion in the C7 vertebral body, and an enlarging lesion in the distal body of the pancreas. 2. Lesion in the distal body of  the pancreas has nonspecific imaging characteristics. This could represent a metastatic lesion. Alternatively, a primary pancreatic neoplasm could be considered. In the appropriate clinical setting, the possibility of a benign lesion such as a pancreatic pseudocyst could be considered, particularly if there is a recent history of pancreatitis. 3. Additional incidental findings, as above. Electronically Signed   By: Vinnie Langton M.D.   On: 04/22/2016 16:33   Ct Abdomen Pelvis W Contrast  Result Date: 04/22/2016 CLINICAL DATA:  81 year old female with history of ovarian cancer diagnosed in September 2017. Ongoing chemotherapy. No current complaints. EXAM: CT CHEST, ABDOMEN, AND PELVIS WITH CONTRAST TECHNIQUE: Multidetector CT imaging of the chest, abdomen and pelvis was performed following the standard protocol during bolus administration of intravenous contrast. CONTRAST:  147m ISOVUE-300 IOPAMIDOL (ISOVUE-300) INJECTION 61% COMPARISON:  CT the chest, abdomen and pelvis 02/04/2016. FINDINGS: CT CHEST FINDINGS Cardiovascular: Heart size is normal. There is no significant pericardial fluid, thickening or pericardial calcification. There is aortic atherosclerosis, as well as atherosclerosis of the great vessels of the mediastinum and the coronary arteries, including calcified atherosclerotic plaque in the left anterior descending and right coronary arteries. Right internal jugular single-lumen porta cath with tip terminating in the right atrium. Mediastinum/Nodes: Right hilar lymph node measuring up to 12 mm in short axis. No mediastinal lymphadenopathy. Esophagus is unremarkable in appearance. No axillary lymphadenopathy. Lungs/Pleura: Again noted are innumerable pulmonary nodules, many of which are centrally cavitary. These nodules appear stable and/or increased in size compared to the prior examination, but there are clearly many more new nodules than seen on the prior study. The largest single nodule on  today's examination currently measures 18 x 11 mm in the superior segment of the left lower lobe (image 55 of series 4) previously 17 x 7 mm on 02/04/2016. No confluent consolidative airspace disease. Trace right pleural effusion lying dependently, slightly smaller than the prior study. Musculoskeletal: 17 mm sclerotic lesion in the C7 vertebral body slightly to the left of midline, increasing in size compared to the prior study, compatible with a metastatic lesion. CT ABDOMEN PELVIS FINDINGS Hepatobiliary: 11 mm lesion in segment 7 is similar in size to the prior study, previously characterized as a cavernous hemangioma. Other previously noted 9 mm lesion in segment 6 appears hypervascular on today's study, previously low-attenuation, presumably a small cavernous hemangioma. New ill-defined hypovascular lesion in segment 4B measuring 9 mm (image 66 of series 2) is indeterminate, but suspicious for potential metastasis. No intra or extrahepatic biliary ductal dilatation. Gallbladder is normal in appearance. Pancreas: Enlarging intermediate attenuation lesion with peripheral rim enhancement in the body of the pancreas (image 60 of series 2) measuring 3.0 x 2.6 cm, with distal ductal dilatation throughout the remaining body and tail of the pancreas. Spleen: Unremarkable. Adrenals/Urinary Tract: Bilateral kidneys and bilateral adrenal glands are  normal in appearance. There is no hydroureteronephrosis. Urinary bladder is normal in appearance. Stomach/Bowel: Normal appearance of the stomach. No pathologic dilatation of small bowel or colon. Normal appendix. Vascular/Lymphatic: Aortic atherosclerosis, without evidence of aneurysm or dissection in the abdominal or pelvic vasculature. No lymphadenopathy noted in the abdomen or pelvis. Reproductive: Status post hysterectomy. Bilateral cystic ovarian masses (left greater than right) appear slightly smaller when compared to the prior examination. Right ovarian mass currently  measures 4.3 x 2.7 x 2.8 cm. Left ovarian mass currently measures 6.5 x 4.2 x 8.4 cm. Other: Omental caking noted on the prior study is less pronounced on today's examination, and is best visualized in the low anatomic pelvis where it measures up to 9 mm in thickness. No significant volume of ascites. No pneumoperitoneum. Well-defined low-attenuation lesion beneath the crus of the right hemidiaphragm, similar appearance to prior studies, favored to be a benign lesion (likely a mildly dilated cisterna chyli). Musculoskeletal: There are no aggressive appearing lytic or blastic lesions noted in the visualized portions of the skeleton. IMPRESSION: 1. Today's study demonstrates a mixed response to therapy. Specifically, while the primary ovarian lesions and the omental caking has regressed compared to the prior examination, there is an increased number and size of pulmonary nodules, possible new hepatic lesion, enlarging lesion in the C7 vertebral body, and an enlarging lesion in the distal body of the pancreas. 2. Lesion in the distal body of the pancreas has nonspecific imaging characteristics. This could represent a metastatic lesion. Alternatively, a primary pancreatic neoplasm could be considered. In the appropriate clinical setting, the possibility of a benign lesion such as a pancreatic pseudocyst could be considered, particularly if there is a recent history of pancreatitis. 3. Additional incidental findings, as above. Electronically Signed   By: Vinnie Langton M.D.   On: 04/22/2016 16:33    ASSESSMENT & PLAN:  Ovarian cancer on right [facility]) Unfortunately, recent imaging study documented disease progression including progression of lung metastasis, bone metastasis and pancreatic nodule. Prior to seeing me, she has visited with gynecologist oncologist who has extensive discussion with the patient and family members regarding the goals of care. The patient wants to pursue further palliative systemic  treatment. She does not have painful bony metastasis but given the location in the thoracic spine, I think is reasonable to get an urgent radiation oncology consultation for possible palliative radiation treatments. I would defer to the radiation oncologist to determine whether for thoracic/spine MRI would be helpful prior to radiation treatment. As soon as she has completed radiation treatment, I will proceed with palliative chemotherapy. I felt that single agent gemcitabine would be helpful.  If these pancreatic nodule represent possible second malignancy such as pancreatic cancer, gemcitabine would also target that.  The risk and benefits of biopsy of the pancreatic nodule has been discussed and the patient declined biopsy which I think is reasonable.  Malignant neoplasm metastatic to lung [facility]) She has clear evidence of disease progression but remained asymptomatic.  After radiation therapy is completed, we will begin systemic treatment for this.  Metastasis to bone [facility]) She denies painful bone lesion.  CT demonstrated T7 involvement. Radiation oncology consultation is pending. She may benefit from future IV bisphosphonate and I would discuss this further with the patient in the next visit.  She will need dental clearance prior to IV bisphosphonate therapy.  Anemia due to antineoplastic chemotherapy There is a component of anemia of chronic disease from recent chemotheraoy. The patient denies recent history of bleeding  such as epistaxis, hematuria or hematochezia. She is asymptomatic from the anemia. We will observe for now.  Recent iron studies show high ferritin. I recommend she stops taking iron supplement as it contributes to severe constipation.  Goals of care, counseling/discussion The patient is aware she has incurable disease and treatment is strictly palliative. Despite significant prior treatment and disease progression, the patient wants to pursue further treatment.  Pancreatic  lesion Cause unknown, but could be due to secondary metastasis versus second primary malignancy.  As above, will pursue palliative chemotherapy with single agent gemcitabine.   No orders of the defined types were placed in this encounter.  All questions were answered. The patient knows to call the clinic with any problems, questions or concerns. No barriers to learning was detected. I spent 30 minutes counseling the patient face to face. The total time spent in the appointment was 55 minutes and more than 50% was on counseling and review of test results     Heath Lark, MD 04/29/2016 8:34 AM

## 2016-04-29 NOTE — Progress Notes (Signed)
DISCONTINUE OFF PATHWAY REGIMEN - Ovarian     A cycle is every 21 days:     Paclitaxel        Dose Mod: None     Carboplatin        Dose Mod: None  **Always confirm dose/schedule in your pharmacy ordering system**    REASON: Disease Progression PRIOR TREATMENT: [id number]: Carboplatin AUC=6 + Paclitaxel 175 mg/m2 q21 Days x 2-4 Cycles TREATMENT RESPONSE: Progressive Disease (PD)  START OFF PATHWAY REGIMEN - Ovarian   OFF00015:Gemcitabine 1,000 mg/m2 Days 1, 8, 15 q28 Days:   A cycle is every 28 days (3 weeks on and 1 week off):     Gemcitabine        Dose Mod: None  **Always confirm dose/schedule in your pharmacy ordering system**    Patient Characteristics: Second Line Therapy, < 6 Months AJCC T Category: T3c AJCC N Category: N1 AJCC M Category: M1 AJCC 8 Stage Grouping: IV Line of Therapy: Second Line Therapy BRCA Mutation Status: Awaiting Test Results Would you be surprised if this patient died  in the next year? I would NOT be surprised if this patient died in the next year Time since last treatment: < 6 Months  Intent of Therapy: Non-Curative / Palliative Intent, Discussed with Patient

## 2016-04-29 NOTE — Assessment & Plan Note (Signed)
Cause unknown, but could be due to secondary metastasis versus second primary malignancy.  As above, will pursue palliative chemotherapy with single agent gemcitabine.

## 2016-04-29 NOTE — Assessment & Plan Note (Signed)
There is a component of anemia of chronic disease from recent chemotheraoy. The patient denies recent history of bleeding such as epistaxis, hematuria or hematochezia. She is asymptomatic from the anemia. We will observe for now.  Recent iron studies show high ferritin. I recommend she stops taking iron supplement as it contributes to severe constipation. 

## 2016-04-29 NOTE — Assessment & Plan Note (Signed)
Unfortunately, recent imaging study documented disease progression including progression of lung metastasis, bone metastasis and pancreatic nodule. Prior to seeing me, she has visited with gynecologist oncologist who has extensive discussion with the patient and family members regarding the goals of care. The patient wants to pursue further palliative systemic treatment. She does not have painful bony metastasis but given the location in the thoracic spine, I think is reasonable to get an urgent radiation oncology consultation for possible palliative radiation treatments. I would defer to the radiation oncologist to determine whether for thoracic/spine MRI would be helpful prior to radiation treatment. As soon as she has completed radiation treatment, I will proceed with palliative chemotherapy. I felt that single agent gemcitabine would be helpful.  If these pancreatic nodule represent possible second malignancy such as pancreatic cancer, gemcitabine would also target that.  The risk and benefits of biopsy of the pancreatic nodule has been discussed and the patient declined biopsy which I think is reasonable.

## 2016-04-29 NOTE — Progress Notes (Signed)
Please see the Nurse Progress Note in the MD Initial Consult Encounter for this patient. 

## 2016-04-29 NOTE — Progress Notes (Signed)
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 female who is seen today for recent diagnosis of ovarian cancer. She is seen out courtesy of Dr. Nancy Marus. The patient initially presented in August 2017 with lower abdominal pain x 2 weeks and decreased appetite with weight loss. She reports this decreased appetite and weight loss was ongoing for over a year. The patient underwent CT A/P on 11/07/15 revealing bilateral cystic and solid ovarian masses, as well as multiple pulmonary nodules bilaterally compatible with pulmonary metastases. Biopsy on 11/14/15 revealed adenocarcinoma.   On 12/09/15 the patient underwent additional Chest CT imaging, which showed innumerable pulmonary nodules of various size compatible with diffuse pulmonary metastatic disease. Nodules were noted to be minimally larger than on previous scan. Additionally, it was noted that there was extensive peritoneal carcinomatosis in the upper abdomen and possible tumor involvement of the pancreatic tail.  The patient began chemotherapy on 12/12/15 with carboplatin and taxol. CT C/A/P on 02/04/16 showed slight improvement in pulmonary and omental/peritoneal metastasis, as well as an enlarged cystic lesion within the left hemipelvis which could represent a primary ovarian malignancy or metastasis. She completed 6 cycles of chemotherapy on 03/26/16.  Chest CT on 04/22/16 showed a mixed response to chemotherapy. Primary ovarian lesion regressed compared to prior study, however, there was increased number and size of pulmonary nodules, possible new hepatic lesions, enlarging lesion  in the C7 vertebral body, and an enlarging lesion in the distal body of the pancreas.  The patient has undergone genetic testing, which revealed no abnormalities.  The patient presents to the clinic today to discuss the role that radiation may play in the treatment of her disease. She is accompanied today by her daughter.  On review of systems, the patient reports pain 4/10 in severity to her abdomen. She also reports new onset of left hip pain as of yesterday. 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 She reports constipation, which she attributes to taking iron supplements. The patient is not currently taking Decadron.   PREVIOUS RADIATION THERAPY: No  PAST MEDICAL HISTORY:  has a past medical history of Diabetes mellitus; Hyperlipidemia; Hypertension; and Ovarian cancer ([place]).    PAST SURGICAL HISTORY: Past Surgical History:  Procedure Laterality Date  . ABDOMINAL HYSTERECTOMY     total  . IR GENERIC HISTORICAL  01/01/2016   IR US GUIDE VASC ACCESS RIGHT 01/01/2016 Greggory Keen, MD [facility]  . IR GENERIC HISTORICAL  01/01/2016   IR FLUORO GUIDE PORT INSERTION RIGHT 01/01/2016 Greggory Keen, MD [facility]    FAMILY HISTORY: family history includes Breast cancer (age of onset: 8) in her sister; Cancer in her brother and cousin; Diabetes in her brother; Hypertension in her father, mother, and sister; Other in her mother and son; Ovarian cysts in her sister; Prostate cancer in her brother and brother; Seizures in her brother; Stroke in her father and mother.  SOCIAL HISTORY:  reports that she quit smoking about 38 years ago. Her smoking use included Cigarettes. She has a 1.75 pack-year smoking history. She has never used smokeless tobacco.  ALLERGIES: Codeine  MEDICATIONS:  Current Outpatient Prescriptions  Medication  Sig Dispense Refill  . insulin lispro (HUMALOG KWIKPEN) 100 UNIT/ML KiwkPen Check sugars and if: 0-150 take no insulin, 151-250 take 2 units, 251-300 take 4  units, 301-400, take 6 units, >400 call office. 15 mL 11  . lidocaine-prilocaine (EMLA) cream Apply to PortaCath 1-2 hrs prior to access as directed to numb area. 30 g 1  . lisinopril-hydrochlorothiazide (PRINZIDE,ZESTORETIC) 20-12.5 MG tablet take 1 tablet by mouth once daily 90 tablet 3  . loratadine (CLARITIN) 10 MG tablet Take 10 mg by mouth daily as needed for allergies.    Marland Kitchen LORazepam (ATIVAN) 0.5 MG tablet Place 1 tablet under the tongue or swallow every 6 hrs as needed for nausea. Will make drowsy. 30 tablet 0  . Magnesium Hydroxide (MILK OF MAGNESIA CONCENTRATE PO) Take 1 Dose by mouth daily as needed.    . Melatonin 3 MG CAPS Take 1 capsule by mouth at bedtime as needed.    . metFORMIN (GLUCOPHAGE) 1000 MG tablet take 1 tablet by mouth twice a day with food 180 tablet 3  . multivitamin (THERAGRAN) tablet Take 1 tablet by mouth daily.      . ondansetron (ZOFRAN) 8 MG tablet Take 1 tablet (8 mg total) by mouth every 8 (eight) hours as needed for nausea or vomiting (Will not make drowsy.). 30 tablet 1  . polyethylene glycol (MIRALAX / GLYCOLAX) packet Take 17 g by mouth 2 (two) times daily as needed.    Marland Kitchen dexamethasone (DECADRON) 4 MG tablet Take 5 tablets with food 12 hrs and 6 hrs prior to Taxol Chemotherapy. (Patient not taking: Reported on 04/29/2016) 30 tablet 0  . HEMOCYTE 324 (106 Fe) MG TABS tablet Take 1 tablet (106 mg of iron total) by mouth daily. (Patient not taking: Reported on 04/29/2016) 30 tablet 2  . simvastatin (ZOCOR) 40 MG tablet take 1 tablet by mouth at bedtime (Patient not taking: Reported on 04/15/2016) 90 tablet 3   No current facility-administered medications for this encounter.    Facility-Administered Medications Ordered in Other Encounters  Medication Dose Route Frequency Provider Last Rate Last Dose  . 0.9 %  sodium chloride infusion   Intravenous Continuous Jacqulynn Cadet, MD        REVIEW OF SYSTEMS:  .  Pertinent items are noted in HPI.  A 12 point review of  systems was performed and no other significant issues   PHYSICAL EXAM:  height is 5\' 7"  (1.702 m) and weight is 136 lb 9.6 oz (62 kg). Her oral temperature is 98.2 F (36.8 C). Her blood pressure is 126/76 and her pulse is 93. Her oxygen saturation is 100%.   General: Alert and oriented, in no acute distress.  HEENT: Extraocular movements are intact. PERRLA. Oropharynx and oral cavity are clear. Partial dentures in place. Neck: Neck is supple, with no palpable cervical or supraclavicular lymphadenopathy or masses appreciated. Heart: Regular in rate and rhythm with no murmurs. Chest: Clear to auscultation bilaterally. Abdomen: Soft, nontender, nondistended, with no rigidity or guarding. Extremities: No edema. Lymphatics: see Neck Exam Skin: No concerning lesions. Musculoskeletal: symmetric strength and muscle tone throughout. Neurologic: No obvious focalities. Speech is fluent. Coordination is intact. Psychiatric: Judgment and insight are intact. Affect is appropriate. Palpation along the lower cervical and upper thoracic spine area reveals no point tenderness. Palpation along the pelvis region reveals no point tenderness  ECOG = 1  LABORATORY DATA:  Lab Results  Component Value Date   WBC 4.7 04/22/2016   HGB 8.4 (L) 04/22/2016  HCT 26.2 (L) 04/22/2016   MCV 76.8 (L) 04/22/2016   PLT 172 04/22/2016   NEUTROABS 2.7 04/22/2016   Lab Results  Component Value Date   NA 139 04/22/2016   K 3.9 04/22/2016   CL 102 01/01/2016   CO2 25 04/22/2016   GLUCOSE 170 (H) 04/22/2016   CREATININE 0.8 04/22/2016   CALCIUM 9.9 04/22/2016      RADIOGRAPHY: Ct Chest W Contrast  Result Date: 04/22/2016 CLINICAL DATA:  81 year old female with history of ovarian cancer diagnosed in September 2017. Ongoing chemotherapy. No current complaints. EXAM: CT CHEST, ABDOMEN, AND PELVIS WITH CONTRAST TECHNIQUE: Multidetector CT imaging of the chest, abdomen and pelvis was performed following the standard  protocol during bolus administration of intravenous contrast. CONTRAST:  125mL ISOVUE-300 IOPAMIDOL (ISOVUE-300) INJECTION 61% COMPARISON:  CT the chest, abdomen and pelvis 02/04/2016. FINDINGS: CT CHEST FINDINGS Cardiovascular: Heart size is normal. There is no significant pericardial fluid, thickening or pericardial calcification. There is aortic atherosclerosis, as well as atherosclerosis of the great vessels of the mediastinum and the coronary arteries, including calcified atherosclerotic plaque in the left anterior descending and right coronary arteries. Right internal jugular single-lumen porta cath with tip terminating in the right atrium. Mediastinum/Nodes: Right hilar lymph node measuring up to 12 mm in short axis. No mediastinal lymphadenopathy. Esophagus is unremarkable in appearance. No axillary lymphadenopathy. Lungs/Pleura: Again noted are innumerable pulmonary nodules, many of which are centrally cavitary. These nodules appear stable and/or increased in size compared to the prior examination, but there are clearly many more new nodules than seen on the prior study. The largest single nodule on today's examination currently measures 18 x 11 mm in the superior segment of the left lower lobe (image 55 of series 4) previously 17 x 7 mm on 02/04/2016. No confluent consolidative airspace disease. Trace right pleural effusion lying dependently, slightly smaller than the prior study. Musculoskeletal: 17 mm sclerotic lesion in the C7 vertebral body slightly to the left of midline, increasing in size compared to the prior study, compatible with a metastatic lesion. CT ABDOMEN PELVIS FINDINGS Hepatobiliary: 11 mm lesion in segment 7 is similar in size to the prior study, previously characterized as a cavernous hemangioma. Other previously noted 9 mm lesion in segment 6 appears hypervascular on today's study, previously low-attenuation, presumably a small cavernous hemangioma. New ill-defined hypovascular lesion  in segment 4B measuring 9 mm (image 66 of series 2) is indeterminate, but suspicious for potential metastasis. No intra or extrahepatic biliary ductal dilatation. Gallbladder is normal in appearance. Pancreas: Enlarging intermediate attenuation lesion with peripheral rim enhancement in the body of the pancreas (image 60 of series 2) measuring 3.0 x 2.6 cm, with distal ductal dilatation throughout the remaining body and tail of the pancreas. Spleen: Unremarkable. Adrenals/Urinary Tract: Bilateral kidneys and bilateral adrenal glands are normal in appearance. There is no hydroureteronephrosis. Urinary bladder is normal in appearance. Stomach/Bowel: Normal appearance of the stomach. No pathologic dilatation of small bowel or colon. Normal appendix. Vascular/Lymphatic: Aortic atherosclerosis, without evidence of aneurysm or dissection in the abdominal or pelvic vasculature. No lymphadenopathy noted in the abdomen or pelvis. Reproductive: Status post hysterectomy. Bilateral cystic ovarian masses (left greater than right) appear slightly smaller when compared to the prior examination. Right ovarian mass currently measures 4.3 x 2.7 x 2.8 cm. Left ovarian mass currently measures 6.5 x 4.2 x 8.4 cm. Other: Omental caking noted on the prior study is less pronounced on today's examination, and is best visualized in the low anatomic  pelvis where it measures up to 9 mm in thickness. No significant volume of ascites. No pneumoperitoneum. Well-defined low-attenuation lesion beneath the crus of the right hemidiaphragm, similar appearance to prior studies, favored to be a benign lesion (likely a mildly dilated cisterna chyli). Musculoskeletal: There are no aggressive appearing lytic or blastic lesions noted in the visualized portions of the skeleton. IMPRESSION: 1. Today's study demonstrates a mixed response to therapy. Specifically, while the primary ovarian lesions and the omental caking has regressed compared to the prior  examination, there is an increased number and size of pulmonary nodules, possible new hepatic lesion, enlarging lesion in the C7 vertebral body, and an enlarging lesion in the distal body of the pancreas. 2. Lesion in the distal body of the pancreas has nonspecific imaging characteristics. This could represent a metastatic lesion. Alternatively, a primary pancreatic neoplasm could be considered. In the appropriate clinical setting, the possibility of a benign lesion such as a pancreatic pseudocyst could be considered, particularly if there is a recent history of pancreatitis. 3. Additional incidental findings, as above. Electronically Signed   By: Vinnie Langton M.D.   On: 04/22/2016 16:33   Ct Abdomen Pelvis W Contrast  Result Date: 04/22/2016 CLINICAL DATA:  81 year old female with history of ovarian cancer diagnosed in September 2017. Ongoing chemotherapy. No current complaints. EXAM: CT CHEST, ABDOMEN, AND PELVIS WITH CONTRAST TECHNIQUE: Multidetector CT imaging of the chest, abdomen and pelvis was performed following the standard protocol during bolus administration of intravenous contrast. CONTRAST:  110mL ISOVUE-300 IOPAMIDOL (ISOVUE-300) INJECTION 61% COMPARISON:  CT the chest, abdomen and pelvis 02/04/2016. FINDINGS: CT CHEST FINDINGS Cardiovascular: Heart size is normal. There is no significant pericardial fluid, thickening or pericardial calcification. There is aortic atherosclerosis, as well as atherosclerosis of the great vessels of the mediastinum and the coronary arteries, including calcified atherosclerotic plaque in the left anterior descending and right coronary arteries. Right internal jugular single-lumen porta cath with tip terminating in the right atrium. Mediastinum/Nodes: Right hilar lymph node measuring up to 12 mm in short axis. No mediastinal lymphadenopathy. Esophagus is unremarkable in appearance. No axillary lymphadenopathy. Lungs/Pleura: Again noted are innumerable pulmonary  nodules, many of which are centrally cavitary. These nodules appear stable and/or increased in size compared to the prior examination, but there are clearly many more new nodules than seen on the prior study. The largest single nodule on today's examination currently measures 18 x 11 mm in the superior segment of the left lower lobe (image 55 of series 4) previously 17 x 7 mm on 02/04/2016. No confluent consolidative airspace disease. Trace right pleural effusion lying dependently, slightly smaller than the prior study. Musculoskeletal: 17 mm sclerotic lesion in the C7 vertebral body slightly to the left of midline, increasing in size compared to the prior study, compatible with a metastatic lesion. CT ABDOMEN PELVIS FINDINGS Hepatobiliary: 11 mm lesion in segment 7 is similar in size to the prior study, previously characterized as a cavernous hemangioma. Other previously noted 9 mm lesion in segment 6 appears hypervascular on today's study, previously low-attenuation, presumably a small cavernous hemangioma. New ill-defined hypovascular lesion in segment 4B measuring 9 mm (image 66 of series 2) is indeterminate, but suspicious for potential metastasis. No intra or extrahepatic biliary ductal dilatation. Gallbladder is normal in appearance. Pancreas: Enlarging intermediate attenuation lesion with peripheral rim enhancement in the body of the pancreas (image 60 of series 2) measuring 3.0 x 2.6 cm, with distal ductal dilatation throughout the remaining body and tail of the  pancreas. Spleen: Unremarkable. Adrenals/Urinary Tract: Bilateral kidneys and bilateral adrenal glands are normal in appearance. There is no hydroureteronephrosis. Urinary bladder is normal in appearance. Stomach/Bowel: Normal appearance of the stomach. No pathologic dilatation of small bowel or colon. Normal appendix. Vascular/Lymphatic: Aortic atherosclerosis, without evidence of aneurysm or dissection in the abdominal or pelvic vasculature. No  lymphadenopathy noted in the abdomen or pelvis. Reproductive: Status post hysterectomy. Bilateral cystic ovarian masses (left greater than right) appear slightly smaller when compared to the prior examination. Right ovarian mass currently measures 4.3 x 2.7 x 2.8 cm. Left ovarian mass currently measures 6.5 x 4.2 x 8.4 cm. Other: Omental caking noted on the prior study is less pronounced on today's examination, and is best visualized in the low anatomic pelvis where it measures up to 9 mm in thickness. No significant volume of ascites. No pneumoperitoneum. Well-defined low-attenuation lesion beneath the crus of the right hemidiaphragm, similar appearance to prior studies, favored to be a benign lesion (likely a mildly dilated cisterna chyli). Musculoskeletal: There are no aggressive appearing lytic or blastic lesions noted in the visualized portions of the skeleton. IMPRESSION: 1. Today's study demonstrates a mixed response to therapy. Specifically, while the primary ovarian lesions and the omental caking has regressed compared to the prior examination, there is an increased number and size of pulmonary nodules, possible new hepatic lesion, enlarging lesion in the C7 vertebral body, and an enlarging lesion in the distal body of the pancreas. 2. Lesion in the distal body of the pancreas has nonspecific imaging characteristics. This could represent a metastatic lesion. Alternatively, a primary pancreatic neoplasm could be considered. In the appropriate clinical setting, the possibility of a benign lesion such as a pancreatic pseudocyst could be considered, particularly if there is a recent history of pancreatitis. 3. Additional incidental findings, as above. Electronically Signed   By: Vinnie Langton M.D.   On: 04/22/2016 16:33      IMPRESSION: This is a 81 y.o. woman with FIGO Stage IVB  epithelial ovarian cancer ([place]). She has a significant lesion at C7. Surprisingly she does not have any pain in this area.  With further progression this could result in pathologic fracture and possibly neurologic compromise with further progression.  I would recommend a short course of palliative radiation therapy directed at the C7 area. After this therapy is complete the patient will proceed with gemcitabine chemotherapy. At the time of simulation and I will scan the entire cervical spine area to make sure there are no other areas higher up in the neck region.  PLAN: Today, I talked to the patient and family about the findings and work-up thus far.  We discussed the natural history of ovarian cancer and general treatment, highlighting the role of radiotherapy in the management.  We discussed the available radiation techniques, and focused on the details of logistics and delivery.  We reviewed the anticipated acute and late sequelae associated with radiation in this setting.  The patient was encouraged to ask questions that I answered to the best of my ability.  A consent form was discussed, signed, and placed in the patient's chart.  The patient would like to proceed with radiation and will be scheduled for CT simulation on Monday 05/03/16 at 9 am.   ------------------------------------------------  Blair Promise, PhD, MD  This document serves as a record of services personally performed by Gery Pray, MD. It was created on his behalf by Maryla Morrow, a trained medical scribe. The creation of this record is  based on the scribe's personal observations and the provider's statements to them. This document has been checked and approved by the attending provider.

## 2016-04-30 ENCOUNTER — Other Ambulatory Visit: Payer: Self-pay | Admitting: Hematology and Oncology

## 2016-05-03 ENCOUNTER — Ambulatory Visit: Admission: RE | Admit: 2016-05-03 | Payer: Medicare Other | Source: Ambulatory Visit | Admitting: Radiation Oncology

## 2016-05-06 ENCOUNTER — Ambulatory Visit
Admission: RE | Admit: 2016-05-06 | Discharge: 2016-05-06 | Disposition: A | Discharge status: Home / Self Care | Payer: Medicare Other | Source: Ambulatory Visit | Attending: Radiation Oncology | Admitting: Radiation Oncology

## 2016-05-06 DIAGNOSIS — C7951 Secondary malignant neoplasm of bone: Secondary | ICD-10-CM | POA: Diagnosis not present

## 2016-05-06 DIAGNOSIS — R918 Other nonspecific abnormal finding of lung field: Secondary | ICD-10-CM | POA: Diagnosis not present

## 2016-05-06 DIAGNOSIS — Z510 Encounter for antineoplastic radiation therapy: Secondary | ICD-10-CM | POA: Diagnosis not present

## 2016-05-06 NOTE — Progress Notes (Signed)
  Radiation Oncology         [phone] ________________________________  Name: Leslie Rowe MRN: [id number]  Date: 05/06/2016  DOB: [birth year]  SIMULATION AND TREATMENT PLANNING NOTE    ICD-9-CM ICD-10-CM   1. 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  Identity was confirmed.  All relevant records and images related to the planned course of therapy were reviewed.  The patient freely provided informed written consent to proceed with treatment after reviewing the details related to the planned course of therapy. The consent form was witnessed and verified by the simulation staff.  Then, the patient was set-up in a stable reproducible  supine position for radiation therapy.  CT images were obtained.  Surface markings were placed.  The CT images were loaded into the planning software.  Then the target and avoidance structures were contoured.  Treatment planning then occurred.  The radiation prescription was entered and confirmed.  Then, I designed and supervised the construction of a total of 5 medically necessary complex treatment devices.  I have requested : 3D Simulation  I have requested a DVH of the following structures: GTV, spinal cord, vocal cords.  I have ordered:dose calc.  PLAN:  The patient will receive 30 Gy in 10 fractions.  -----------------------------------  Blair Promise, PhD, MD  This document serves as a record of services personally performed by Gery Pray, MD. It was created on his behalf by Bethann Humble, a trained medical scribe. The creation of this record is based on the scribe's personal observations and the provider's statements to them. This document has been checked and approved by the attending provider.

## 2016-05-07 ENCOUNTER — Telehealth: Payer: Self-pay | Admitting: Hematology and Oncology

## 2016-05-07 NOTE — Telephone Encounter (Signed)
sw pt to confirm added appt on 3/28 at 11 am per LOS

## 2016-05-10 ENCOUNTER — Telehealth: Payer: Self-pay

## 2016-05-10 ENCOUNTER — Other Ambulatory Visit: Payer: Self-pay

## 2016-05-10 ENCOUNTER — Other Ambulatory Visit: Payer: Self-pay | Admitting: Hematology and Oncology

## 2016-05-10 DIAGNOSIS — C561 Malignant neoplasm of right ovary: Secondary | ICD-10-CM

## 2016-05-10 MED ORDER — OXYCODONE HCL 5 MG PO TABS: 5.0000 mg | ORAL_TABLET | ORAL | 0 refills | Status: DC | PRN

## 2016-05-10 MED ORDER — MIRTAZAPINE 15 MG PO TABS: 15.0000 mg | ORAL_TABLET | Freq: Every day | ORAL | 1 refills | Status: DC

## 2016-05-10 NOTE — Telephone Encounter (Signed)
Daughter called requesting an appt this week. Pt last chemo was 1/26. Pt had fever with a stomach virus a week ago. She is barely eating at all, little boost and water. She has abdominal pain, taking more ibuprofen but still able to sleep. She is requesting 1) rx for appetite stimulant 2) rx for pain outside of ibuprofen 3) labs b/c she is weak.    Coming in today is very hard b/c daughter has children and her brother today. With advance notice she can get child care.

## 2016-05-10 NOTE — Telephone Encounter (Signed)
1) I am overbooked. Only availability is tomorrow at 930 am, 30 mins. 2) Call in Remeron 15 mg qhs disp 30, 1 refills 3) Pain medications cannot be called in. I can start her on oxycodone but someone has to come in to collect the prescription 4) Labs tomorrow if she can come in 5) If too sick and miserable, call EMS/bring her to the ER

## 2016-05-10 NOTE — Telephone Encounter (Signed)
Called daughter with below message. Patient will take appointment for tomorrow.  Verbalized understanding.

## 2016-05-11 ENCOUNTER — Other Ambulatory Visit (HOSPITAL_BASED_OUTPATIENT_CLINIC_OR_DEPARTMENT_OTHER): Payer: Medicare Other

## 2016-05-11 ENCOUNTER — Encounter: Payer: Medicare Other | Admitting: Hematology and Oncology

## 2016-05-11 ENCOUNTER — Inpatient Hospital Stay (HOSPITAL_COMMUNITY)
Admission: AD | Admit: 2016-05-11 | Discharge: 2016-05-13 | DRG: 811 | Disposition: A | Discharge status: Home / Self Care | Payer: Medicare Other | Source: Ambulatory Visit | Attending: Hematology and Oncology | Admitting: Hematology and Oncology

## 2016-05-11 ENCOUNTER — Inpatient Hospital Stay (HOSPITAL_COMMUNITY): Payer: Medicare Other

## 2016-05-11 ENCOUNTER — Encounter (HOSPITAL_COMMUNITY): Payer: Self-pay

## 2016-05-11 ENCOUNTER — Ambulatory Visit: Payer: Medicare Other

## 2016-05-11 DIAGNOSIS — C561 Malignant neoplasm of right ovary: Secondary | ICD-10-CM

## 2016-05-11 DIAGNOSIS — E46 Unspecified protein-calorie malnutrition: Secondary | ICD-10-CM | POA: Diagnosis not present

## 2016-05-11 DIAGNOSIS — C786 Secondary malignant neoplasm of retroperitoneum and peritoneum: Secondary | ICD-10-CM

## 2016-05-11 DIAGNOSIS — N390 Urinary tract infection, site not specified: Secondary | ICD-10-CM | POA: Diagnosis not present

## 2016-05-11 DIAGNOSIS — T451X5A Adverse effect of antineoplastic and immunosuppressive drugs, initial encounter: Secondary | ICD-10-CM | POA: Diagnosis not present

## 2016-05-11 DIAGNOSIS — C7800 Secondary malignant neoplasm of unspecified lung: Secondary | ICD-10-CM

## 2016-05-11 DIAGNOSIS — Z803 Family history of malignant neoplasm of breast: Secondary | ICD-10-CM | POA: Diagnosis not present

## 2016-05-11 DIAGNOSIS — C482 Malignant neoplasm of peritoneum, unspecified: Secondary | ICD-10-CM

## 2016-05-11 DIAGNOSIS — E860 Dehydration: Secondary | ICD-10-CM | POA: Diagnosis not present

## 2016-05-11 DIAGNOSIS — K5900 Constipation, unspecified: Secondary | ICD-10-CM | POA: Diagnosis present

## 2016-05-11 DIAGNOSIS — D6481 Anemia due to antineoplastic chemotherapy: Secondary | ICD-10-CM | POA: Diagnosis not present

## 2016-05-11 DIAGNOSIS — R1030 Lower abdominal pain, unspecified: Secondary | ICD-10-CM | POA: Diagnosis present

## 2016-05-11 DIAGNOSIS — Z90710 Acquired absence of both cervix and uterus: Secondary | ICD-10-CM | POA: Diagnosis not present

## 2016-05-11 DIAGNOSIS — R739 Hyperglycemia, unspecified: Secondary | ICD-10-CM | POA: Diagnosis not present

## 2016-05-11 DIAGNOSIS — Z6820 Body mass index (BMI) 20.0-20.9, adult: Secondary | ICD-10-CM

## 2016-05-11 DIAGNOSIS — N19 Unspecified kidney failure: Secondary | ICD-10-CM | POA: Diagnosis present

## 2016-05-11 DIAGNOSIS — Z833 Family history of diabetes mellitus: Secondary | ICD-10-CM | POA: Diagnosis not present

## 2016-05-11 DIAGNOSIS — R112 Nausea with vomiting, unspecified: Secondary | ICD-10-CM | POA: Diagnosis not present

## 2016-05-11 DIAGNOSIS — Z885 Allergy status to narcotic agent status: Secondary | ICD-10-CM | POA: Diagnosis not present

## 2016-05-11 DIAGNOSIS — G893 Neoplasm related pain (acute) (chronic): Secondary | ICD-10-CM | POA: Diagnosis not present

## 2016-05-11 DIAGNOSIS — R5381 Other malaise: Secondary | ICD-10-CM | POA: Diagnosis not present

## 2016-05-11 DIAGNOSIS — R509 Fever, unspecified: Secondary | ICD-10-CM

## 2016-05-11 DIAGNOSIS — D72829 Elevated white blood cell count, unspecified: Secondary | ICD-10-CM

## 2016-05-11 DIAGNOSIS — R109 Unspecified abdominal pain: Secondary | ICD-10-CM | POA: Diagnosis not present

## 2016-05-11 DIAGNOSIS — C7801 Secondary malignant neoplasm of right lung: Secondary | ICD-10-CM | POA: Diagnosis not present

## 2016-05-11 DIAGNOSIS — C7951 Secondary malignant neoplasm of bone: Secondary | ICD-10-CM | POA: Diagnosis not present

## 2016-05-11 DIAGNOSIS — C569 Malignant neoplasm of unspecified ovary: Secondary | ICD-10-CM

## 2016-05-11 DIAGNOSIS — C7802 Secondary malignant neoplasm of left lung: Secondary | ICD-10-CM

## 2016-05-11 DIAGNOSIS — E43 Unspecified severe protein-calorie malnutrition: Secondary | ICD-10-CM | POA: Diagnosis present

## 2016-05-11 DIAGNOSIS — Z95828 Presence of other vascular implants and grafts: Secondary | ICD-10-CM

## 2016-05-11 DIAGNOSIS — D649 Anemia, unspecified: Secondary | ICD-10-CM | POA: Diagnosis not present

## 2016-05-11 DIAGNOSIS — E119 Type 2 diabetes mellitus without complications: Secondary | ICD-10-CM | POA: Diagnosis present

## 2016-05-11 DIAGNOSIS — N3000 Acute cystitis without hematuria: Secondary | ICD-10-CM

## 2016-05-11 DIAGNOSIS — Z87891 Personal history of nicotine dependence: Secondary | ICD-10-CM

## 2016-05-11 DIAGNOSIS — R531 Weakness: Secondary | ICD-10-CM

## 2016-05-11 DIAGNOSIS — C801 Malignant (primary) neoplasm, unspecified: Secondary | ICD-10-CM

## 2016-05-11 LAB — URINALYSIS, ROUTINE W REFLEX MICROSCOPIC
Bilirubin Urine: NEGATIVE
GLUCOSE, UA: 150 mg/dL — AB
Ketones, ur: NEGATIVE mg/dL
Nitrite: NEGATIVE
PH: 6 (ref 5.0–8.0)
Protein, ur: 30 mg/dL — AB
SPECIFIC GRAVITY, URINE: 1.011 (ref 1.005–1.030)
Squamous Epithelial / LPF: NONE SEEN

## 2016-05-11 LAB — CBC WITH DIFFERENTIAL/PLATELET
BASO%: 0.1 % (ref 0.0–2.0)
Basophils Absolute: 0 10*3/uL (ref 0.0–0.1)
EOS%: 0.3 % (ref 0.0–7.0)
Eosinophils Absolute: 0 10*3/uL (ref 0.0–0.5)
HCT: 24.4 % — ABNORMAL LOW (ref 34.8–46.6)
HGB: 7.9 g/dL — ABNORMAL LOW (ref 11.6–15.9)
LYMPH%: 9.2 % — AB (ref 14.0–49.7)
MCH: 24.4 pg — AB (ref 25.1–34.0)
MCHC: 32.4 g/dL (ref 31.5–36.0)
MCV: 75.3 fL — ABNORMAL LOW (ref 79.5–101.0)
MONO#: 1.4 10*3/uL — ABNORMAL HIGH (ref 0.1–0.9)
MONO%: 8.7 % (ref 0.0–14.0)
NEUT#: 13 10*3/uL — ABNORMAL HIGH (ref 1.5–6.5)
NEUT%: 81.7 % — AB (ref 38.4–76.8)
Platelets: 306 10*3/uL (ref 145–400)
RBC: 3.24 10*6/uL — AB (ref 3.70–5.45)
RDW: 15.5 % — ABNORMAL HIGH (ref 11.2–14.5)
WBC: 15.9 10*3/uL — ABNORMAL HIGH (ref 3.9–10.3)
lymph#: 1.5 10*3/uL (ref 0.9–3.3)
nRBC: 0 % (ref 0–0)

## 2016-05-11 LAB — COMPREHENSIVE METABOLIC PANEL
ALBUMIN: 2.8 g/dL — AB (ref 3.5–5.0)
ALK PHOS: 87 U/L (ref 40–150)
ALT: 20 U/L (ref 0–55)
AST: 17 U/L (ref 5–34)
Anion Gap: 13 mEq/L — ABNORMAL HIGH (ref 3–11)
BILIRUBIN TOTAL: 0.45 mg/dL (ref 0.20–1.20)
BUN: 21.8 mg/dL (ref 7.0–26.0)
CALCIUM: 10.1 mg/dL (ref 8.4–10.4)
CO2: 25 mEq/L (ref 22–29)
CREATININE: 1.2 mg/dL — AB (ref 0.6–1.1)
Chloride: 97 mEq/L — ABNORMAL LOW (ref 98–109)
EGFR: 52 mL/min/{1.73_m2} — ABNORMAL LOW (ref >90)
GLUCOSE: 245 mg/dL — AB (ref 70–140)
Potassium: 4.3 mEq/L (ref 3.5–5.1)
Sodium: 135 mEq/L — ABNORMAL LOW (ref 136–145)
TOTAL PROTEIN: 7.3 g/dL (ref 6.4–8.3)

## 2016-05-11 LAB — GLUCOSE, CAPILLARY
Glucose-Capillary: 212 mg/dL — ABNORMAL HIGH (ref 65–99)
Glucose-Capillary: 146 mg/dL — ABNORMAL HIGH (ref 65–99)

## 2016-05-11 LAB — PREPARE RBC (CROSSMATCH)

## 2016-05-11 MED ORDER — HYDROMORPHONE HCL 1 MG/ML IJ SOLN: 1.0000 mg | INTRAMUSCULAR | Status: DC | PRN

## 2016-05-11 MED ORDER — LIDOCAINE-PRILOCAINE 2.5-2.5 % EX CREA: TOPICAL_CREAM | Freq: Once | CUTANEOUS | Status: DC

## 2016-05-11 MED ORDER — ONDANSETRON HCL 4 MG PO TABS: 4.0000 mg | ORAL_TABLET | Freq: Three times a day (TID) | ORAL | Status: DC | PRN

## 2016-05-11 MED ORDER — ONDANSETRON HCL 4 MG/2ML IJ SOLN: 4.0000 mg | Freq: Three times a day (TID) | INTRAMUSCULAR | Status: DC | PRN

## 2016-05-11 MED ORDER — ONDANSETRON 4 MG PO TBDP: 4.0000 mg | ORAL_TABLET | Freq: Three times a day (TID) | ORAL | Status: DC | PRN

## 2016-05-11 MED ORDER — LORAZEPAM 0.5 MG PO TABS
0.5000 mg | ORAL_TABLET | Freq: Four times a day (QID) | ORAL | Status: DC | PRN
  Administered 2016-05-12: 0.5 mg via ORAL
  Filled 2016-05-11: qty 1

## 2016-05-11 MED ORDER — INSULIN ASPART 100 UNIT/ML ~~LOC~~ SOLN
0.0000 [IU] | Freq: Three times a day (TID) | SUBCUTANEOUS | Status: DC
  Administered 2016-05-11: 2 [IU] via SUBCUTANEOUS
  Administered 2016-05-11 – 2016-05-12 (×2): 8 [IU] via SUBCUTANEOUS
  Administered 2016-05-12: 4 [IU] via SUBCUTANEOUS
  Administered 2016-05-13: 8 [IU] via SUBCUTANEOUS
  Administered 2016-05-13: 2 [IU] via SUBCUTANEOUS

## 2016-05-11 MED ORDER — SODIUM CHLORIDE 0.9 % IV SOLN
8.0000 mg | Freq: Three times a day (TID) | INTRAVENOUS | Status: DC | PRN
  Filled 2016-05-11: qty 4

## 2016-05-11 MED ORDER — METFORMIN HCL 500 MG PO TABS
1000.0000 mg | ORAL_TABLET | Freq: Two times a day (BID) | ORAL | Status: DC
  Administered 2016-05-11 – 2016-05-13 (×4): 1000 mg via ORAL
  Filled 2016-05-11 (×4): qty 2

## 2016-05-11 MED ORDER — ENSURE ENLIVE PO LIQD
237.0000 mL | Freq: Two times a day (BID) | ORAL | Status: DC
  Administered 2016-05-11 – 2016-05-13 (×2): 237 mL via ORAL

## 2016-05-11 MED ORDER — HEPARIN SOD (PORK) LOCK FLUSH 100 UNIT/ML IV SOLN
500.0000 [IU] | Freq: Once | INTRAVENOUS | Status: DC
  Filled 2016-05-11: qty 5

## 2016-05-11 MED ORDER — SODIUM CHLORIDE 0.9 % IV SOLN
Freq: Once | INTRAVENOUS | Status: AC
  Administered 2016-05-11: 18:00:00 via INTRAVENOUS

## 2016-05-11 MED ORDER — CEFAZOLIN IN D5W 1 GM/50ML IV SOLN
1.0000 g | Freq: Three times a day (TID) | INTRAVENOUS | Status: DC
  Administered 2016-05-11 – 2016-05-13 (×7): 1 g via INTRAVENOUS
  Filled 2016-05-11 (×7): qty 50

## 2016-05-11 MED ORDER — MIRTAZAPINE 15 MG PO TABS
15.0000 mg | ORAL_TABLET | Freq: Every day | ORAL | Status: DC
  Administered 2016-05-11 – 2016-05-12 (×2): 15 mg via ORAL
  Filled 2016-05-11 (×2): qty 1

## 2016-05-11 MED ORDER — SODIUM CHLORIDE 0.9 % IV SOLN
INTRAVENOUS | Status: DC
  Administered 2016-05-11 – 2016-05-12 (×2): via INTRAVENOUS
  Administered 2016-05-13: 1000 mL via INTRAVENOUS

## 2016-05-11 MED ORDER — CALCITONIN (SALMON) 200 UNIT/ACT NA SOLN
1.0000 | Freq: Every day | NASAL | Status: DC
  Administered 2016-05-11 – 2016-05-13 (×3): 1 via NASAL
  Filled 2016-05-11: qty 3.7

## 2016-05-11 MED ORDER — DIPHENHYDRAMINE HCL 25 MG PO CAPS
25.0000 mg | ORAL_CAPSULE | Freq: Once | ORAL | Status: AC
  Administered 2016-05-11: 25 mg via ORAL
  Filled 2016-05-11: qty 1

## 2016-05-11 MED ORDER — SODIUM CHLORIDE 0.9% FLUSH
10.0000 mL | Freq: Once | INTRAVENOUS | Status: AC
  Administered 2016-05-11: 10 mL
  Filled 2016-05-11: qty 10

## 2016-05-11 MED ORDER — DEXAMETHASONE 4 MG PO TABS: 4.0000 mg | ORAL_TABLET | Freq: Every day | ORAL | Status: DC

## 2016-05-11 MED ORDER — SENNOSIDES-DOCUSATE SODIUM 8.6-50 MG PO TABS: 1.0000 | ORAL_TABLET | Freq: Every evening | ORAL | Status: DC | PRN

## 2016-05-11 MED ORDER — ACETAMINOPHEN 325 MG PO TABS
650.0000 mg | ORAL_TABLET | Freq: Once | ORAL | Status: AC
  Administered 2016-05-11: 650 mg via ORAL
  Filled 2016-05-11: qty 2

## 2016-05-11 MED ORDER — ACETAMINOPHEN 325 MG PO TABS: 650.0000 mg | ORAL_TABLET | ORAL | Status: DC | PRN

## 2016-05-11 MED ORDER — ENOXAPARIN SODIUM 40 MG/0.4ML ~~LOC~~ SOLN
40.0000 mg | SUBCUTANEOUS | Status: DC
  Administered 2016-05-11 – 2016-05-13 (×3): 40 mg via SUBCUTANEOUS
  Filled 2016-05-11 (×3): qty 0.4

## 2016-05-11 NOTE — Progress Notes (Signed)
This encounter was created in error - please disregard.

## 2016-05-11 NOTE — H&P (Signed)
[place] ADMISSION NOTE  Patient Care Team: Hoyt Koch, MD as PCP - General (Internal Medicine) Fredderick Erb, MD as Consulting Physician (Ophthalmology)  CHIEF COMPLAINTS/PURPOSE OF ADMISSION Dehydration, severe anemia, leukocytosis, hypercalcemia, uncontrolled pain due to metastatic ovarian cancer  HISTORY OF PRESENTING ILLNESS:  Leslie Rowe 81 y.o. female is admitted for further management of multiple symptoms. Summary of oncologic history as follows:   Primary peritoneal carcinomatosis ([place])   11/14/2015 Initial Diagnosis    Primary peritoneal carcinomatosis ([place]). Stage IV based on pulmonary nodules.       Ovarian cancer on right [facility])   11/07/2015 Imaging    Multiple pulmonary nodules bilaterally compatible with pulmonary metastases. Peritoneal carcinomatosis with tumor deposits throughout omentum and at the pericolic gutters as well as within the pelvis. BILATERAL cystic and solid ovarian masses, predominately cystic on LEFT and predominately solid on RIGHT, question ovarian neoplasms versus tumor deposits at the ovaries bilaterally from peritoneal carcinomatosis. Low-attenuation retrocrural nodes. Small RIGHT pleural effusion and minimal perihepatic ascites. Question metastasis at pancreatic tail. Sigmoid diverticulosis. BILATERAL inguinal hernias containing fat. Aortic atherosclerosis.       11/12/2015 Tumor Marker    Patient's tumor was tested for the following markers: CA125. Results of the tumor marker test revealed 301.8.      11/14/2015 Procedure    Peritoneal tumor just deep to the right abdominal wall was targeted. A tangential approach was performed with needle insertion to the left of midline. Three core biopsy needle passes resulted in 2 intact core biopsy specimens and 1 partial tissue fragment.  IMPRESSION: CT-guided core biopsy performed of peritoneal tumor.      11/14/2015 Pathology Results    Soft Tissue Needle Core Biopsy,  Peritoneal Cavity - ADENOCARCINOMA. - SEE MICROSCOPIC DESCRIPTION. Microscopic Comment The morphologic features are compatible with metastatic ovarian cancer. There is sufficient material for additional studies if requested. (JDP:gt, 11/17/15) PROGNOSTIC INDICATORS Results: IMMUNOHISTOCHEMICAL AND MORPHOMETRIC ANALYSIS PERFORMED MANUALLY Estrogen Receptor: 80%, POSITIVE, STRONG STAINING INTENSITY Progesterone Receptor: 10%, POSITIVE, STRONG STAINING INTENSITY REFERENCE RANGE ESTROGEN RECEPTOR NEGATIVE 0% POSITIVE =>1% REFERENCE RANGE PROGESTERONE RECEPTOR NEGATIVE 0% POSITIVE =>1% All controls stained appropriately      12/05/2015 Tumor Marker    Patient's tumor was tested for the following markers: CA125 Results of the tumor marker test revealed 469.2      12/10/2015 Imaging    Innumerable pulmonary nodules of variable size compatible with diffuse pulmonary metastatic disease. Nodules appear minimally larger and there are some new tiny pulmonary nodules in the lung bases compared to 11/07/2015. 2. Moderate and enlarging right pleural effusion. 3. Extensive peritoneal carcinomatosis in the upper abdomen. Possible tumor involving the pancreatic tail. 4. Borderline prominent right hilar lymph node.      12/12/2015 - 03/26/2016 Chemotherapy    She has received 6 cycles of carboplatin & taxol       12/25/2015 Tumor Marker    Patient's tumor was tested for the following markers: CA125 Results of the tumor marker test revealed 739.7      01/01/2016 Procedure    Ultrasound and fluoroscopically guided right internal jugular single lumen power port catheter insertion. Tip in the SVC/RA junction. Catheter ready for use      01/19/2016 Tumor Marker    Patient's tumor was tested for the following markers: CA125 Results of the tumor marker test revealed 421.9      02/04/2016 Imaging    CT CHEST IMPRESSION  1. Slight improvement in pulmonary metastasis. 2. Decrease in  low left  cervical/supraclavicular adenopathy, favoring response to therapy. 3. Decrease in small right pleural effusion.  CT ABDOMEN AND PELVIS IMPRESSION  1. Improvement in omental/peritoneal metastasis. 2. Enlarged cystic lesion within the left hemipelvis could represent a primary ovarian malignancy or metastasis. Right ovarian soft tissue fullness and heterogeneity is not significantly changed. 3. Pancreatic body/tail junction hypoattenuation is difficult to quantify secondary to morphology. However, felt to be increased in conspicuity today. Considerations include metastatic disease or a somewhat atypical distribution of pancreatic adenocarcinoma with metastasis. 4. Slight improvement in right common iliac high adenopathy. 5. Aortic atherosclerosis.       02/04/2016 Genetic Testing    Genetics testing normal 02-04-16 (298 gene myRisk panel by [employer]). Tumor BRACAnalysis and Genomic Instability Status Testing was also performed through [employer], negative      02/05/2016 Tumor Marker    Patient's tumor was tested for the following markers: CA125 Results of the tumor marker test revealed 307.1      03/04/2016 Tumor Marker    Patient's tumor was tested for the following markers: CA125 Results of the tumor marker test revealed 180.9      03/26/2016 Tumor Marker    Patient's tumor was tested for the following markers: CA125 Results of the tumor marker test revealed 132.8      04/15/2016 Tumor Marker    Patient's tumor was tested for the following markers: CA125 Results of the tumor marker test revealed 109.4      04/22/2016 Imaging    Today's study demonstrates a mixed response to therapy. Specifically, while the primary ovarian lesions and the omental caking has regressed compared to the prior examination, there is an increased number and size of pulmonary nodules, possible new hepatic lesion, enlarging lesion in the C7 vertebral body, and an enlarging lesion in the distal  body of the pancreas. 2. Lesion in the distal body of the pancreas has nonspecific imaging characteristics. This could represent a metastatic lesion. Alternatively, a primary pancreatic neoplasm could be considered. In the appropriate clinical setting, the possibility of a benign lesion such as a pancreatic pseudocyst could be considered, particularly if there is a recent history of pancreatitis.      The patient called yesterday feeling unwell. She has severe lower abdominal pain, not relieved by ibuprofen. Her intake is very poor and she has lost a lot of weight. She also have uncontrolled hyperglycemia with a blood sugar running between 200-300 at home despite poor oral intake. Last week, she had low-grade fever of 102.0. She denies localizing signs such as sore throat or cough.  She has some mild constipation, relieved by laxatives.  Last bowel movement was 2 days ago.  Yesterday, she had nausea and vomiting after eating. She feels miserable and weak. The patient denies any recent signs or symptoms of bleeding such as spontaneous epistaxis, hematuria or hematochezia.  MEDICAL HISTORY:  Past Medical History:  Diagnosis Date  . Diabetes mellitus    type II, mild  . Hyperlipidemia   . Hypertension    borderline  . Ovarian cancer [facility])     SURGICAL HISTORY: Past Surgical History:  Procedure Laterality Date  . ABDOMINAL HYSTERECTOMY     total  . IR GENERIC HISTORICAL  01/01/2016   IR US GUIDE VASC ACCESS RIGHT 01/01/2016 Greggory Keen, MD [facility]-INTERV RAD  . IR GENERIC HISTORICAL  01/01/2016   IR FLUORO GUIDE PORT INSERTION RIGHT 01/01/2016 Greggory Keen, MD [facility]    SOCIAL HISTORY: Social History  Social History  . Marital status: Divorced    Spouse name: N/A  . Number of children: 2  . Years of education: 12   Occupational History  . 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 Smoking status: Former Smoker    Packs/day: 0.25    Years: 7.00    Types:  Cigarettes    Quit date: 03/01/1978  . Smokeless tobacco: Never Used     Comment: smoked 3-5 cigs per day for 7-8 yrs  . Alcohol use Not on file  . Drug use: Unknown  . Sexual activity: Not Currently   Other Topics Concern  . Not on file   Social History Narrative   HSG. Married '60 - 81yrs/divorced. Married '80's - 35 yrs/divorced. 1 dtr - '70, 1 son - '64. 2 grandchildren. Retired - [employer]. Lives in own home, son is autistic and lives with her. Staying busy.              FAMILY HISTORY: Family History  Problem Relation Age of Onset  . Hypertension Mother   . Other Mother     CVa  . Stroke Mother   . Cancer Brother     NOS cancer w/ metastasis; d. 56s  . Prostate cancer Brother   . Other Son     Autistic  . Hypertension Father   . Stroke Father   . Hypertension Sister   . Breast cancer Sister 80  . Ovarian cysts Sister     TAH-BSO for benign ovarian cyst  . Seizures Brother     d. 50  . Diabetes Brother   . Prostate cancer Brother     dx "awhile ago"  . Cancer Cousin     maternal 1st cousin, once-removed dx NOS cancer in his late 84s-early 66s    ALLERGIES:  is allergic to codeine.  MEDICATIONS:  Current Facility-Administered Medications  Medication Dose Route Frequency Provider Last Rate Last Dose  . 0.9 %  sodium chloride infusion   Intravenous Once Artis Delay, MD      . 0.9 %  sodium chloride infusion   Intravenous Continuous Artis Delay, MD      . acetaminophen (TYLENOL) tablet 650 mg  650 mg Oral Once Artis Delay, MD      . acetaminophen (TYLENOL) tablet 650 mg  650 mg Oral Q4H PRN Artis Delay, MD      . calcitonin (salmon) (MIACALCIN/FORTICAL) nasal spray 1 spray  1 spray Alternating Nares Daily Artis Delay, MD      . Melene Muller ON 05/12/2016] dexamethasone (DECADRON) tablet 4 mg  4 mg Oral Daily Dontell Mian, MD      . diphenhydrAMINE (BENADRYL) capsule 25 mg  25 mg Oral Once Artis Delay, MD      . enoxaparin (LOVENOX) injection 40 mg  40 mg Subcutaneous Q24H Maricus Tanzi, MD      . feeding supplement (ENSURE ENLIVE) (ENSURE ENLIVE) liquid 237 mL  237 mL Oral BID BM Todrick Siedschlag, MD      . HYDROmorphone (DILAUDID) injection 1 mg  1 mg Intravenous Q2H PRN Leshonda Galambos, MD      . ondansetron (ZOFRAN) tablet 4-8 mg  4-8 mg Oral Q8H PRN Artis Delay, MD       Or  . ondansetron (ZOFRAN-ODT) disintegrating tablet 4-8 mg  4-8 mg Oral Q8H PRN Raea Magallon, MD       Or  . ondansetron (ZOFRAN) injection 4 mg  4 mg Intravenous Q8H PRN  Heath Lark, MD       Or  . ondansetron (ZOFRAN) 8 mg in sodium chloride 0.9 % 50 mL IVPB  8 mg Intravenous Q8H PRN Elmer Boutelle, MD      . senna-docusate (Senokot-S) tablet 1 tablet  1 tablet Oral QHS PRN Heath Lark, MD       Facility-Administered Medications Ordered in Other Encounters  Medication Dose Route Frequency Provider Last Rate Last Dose  . 0.9 %  sodium chloride infusion   Intravenous Continuous Jacqulynn Cadet, MD        REVIEW OF SYSTEMS:   Eyes: Denies blurriness of vision, double vision or watery eyes Ears, nose, mouth, throat, and face: Denies mucositis or sore throat Respiratory: Denies cough, dyspnea or wheezes Cardiovascular: Denies palpitation, chest discomfort or lower extremity swelling Skin: Denies abnormal skin rashes Lymphatics: Denies new lymphadenopathy or easy bruising Neurological:Denies numbness, tingling  Behavioral/Psych: Mood is stable, no new changes  All other systems were reviewed with the patient and are negative.  PHYSICAL EXAMINATION: ECOG PERFORMANCE STATUS: 2 - Symptomatic, <50% confined to bed  Vitals:   05/11/16 1039  BP: (!) 101/57  Pulse: (!) 104  Resp: 18  Temp: 98.3 F (36.8 C)   There were no vitals filed for this visit.  GENERAL:alert, no distress and comfortable.  She looks thin and cachectic SKIN: skin color, texture, turgor are normal, no rashes or significant lesions EYES: normal, conjunctiva are pink and non-injected, sclera clear OROPHARYNX: Noted dry mucous membrane.   No thrush NECK: supple, thyroid normal size, non-tender, without nodularity LYMPH:  no palpable lymphadenopathy in the cervical, axillary or inguinal LUNGS: clear to auscultation and percussion with normal breathing effort HEART: regular rate & rhythm and no murmurs and no lower extremity edema ABDOMEN:abdomen soft, mild tenderness throughout without rebound or guarding  musculoskeletal:no cyanosis of digits and no clubbing  PSYCH: alert & oriented x 3 with fluent speech NEURO: no focal motor/sensory deficits  LABORATORY DATA:  I have reviewed the data as listed Lab Results  Component Value Date   WBC 15.9 (H) 05/11/2016   HGB 7.9 (L) 05/11/2016   HCT 24.4 (L) 05/11/2016   MCV 75.3 (L) 05/11/2016   PLT 306 05/11/2016    Recent Labs  10/10/15 1326  01/01/16 1007  04/15/16 1246 04/22/16 0929 05/11/16 0857  NA 138  < > 136  < > 136 139 135*  K 4.0  < > 4.4  < > 4.0 3.9 4.3  CL 101  --  102  --   --   --   --   CO2 31  < > 25  < > _0 GLUCOSE 166*  < > 151*  < > 224* 170* 245*  BUN 17  < > 20  < > 21.7 16.1 21.8  CREATININE 0.73  < > 0.74  < > 0.9 0.8 1.2*  CALCIUM 9.9  < > 9.9  < > 9.9 9.9 10.1  GFRNONAA  --   --  >60  --   --   --   --   GFRAA  --   --  >60  --   --   --   --   PROT 7.3  < >  --   < > 6.9 7.1 7.3  ALBUMIN 4.3  < >  --   < > 3.5 3.7 2.8*  AST 15  < >  --   < > _1 ALT 9  < >  --   < >  _0 ALKPHOS 72  < >  --   < > 78 85 87  BILITOT 0.4  < >  --   < > 0.28 0.33 0.45  < > = values in this interval not displayed.  RADIOGRAPHIC STUDIES: I have personally reviewed the radiological images as listed and agreed with the findings in the report. X-ray Chest Pa And Lateral  Result Date: 05/11/2016 CLINICAL DATA:  Fever EXAM: CHEST  2 VIEW COMPARISON:  CT chest 04/22/2016 FINDINGS: Innumerable pulmonary nodules are seen throughout both lungs. Right jugular Port-A-Cath is stable. Lungs are hyperaerated. Tiny right pleural effusion is stable. No  definite new consolidation. No pneumothorax. IMPRESSION: Stable innumerable bilateral pulmonary nodules. Electronically Signed   By: Marybelle Killings M.D.   On: 05/11/2016 11:38   Ct Chest W Contrast  Result Date: 04/22/2016 CLINICAL DATA:  81 year old female with history of ovarian cancer diagnosed in September 2017. Ongoing chemotherapy. No current complaints. EXAM: CT CHEST, ABDOMEN, AND PELVIS WITH CONTRAST TECHNIQUE: Multidetector CT imaging of the chest, abdomen and pelvis was performed following the standard protocol during bolus administration of intravenous contrast. CONTRAST:  168m ISOVUE-300 IOPAMIDOL (ISOVUE-300) INJECTION 61% COMPARISON:  CT the chest, abdomen and pelvis 02/04/2016. FINDINGS: CT CHEST FINDINGS Cardiovascular: Heart size is normal. There is no significant pericardial fluid, thickening or pericardial calcification. There is aortic atherosclerosis, as well as atherosclerosis of the great vessels of the mediastinum and the coronary arteries, including calcified atherosclerotic plaque in the left anterior descending and right coronary arteries. Right internal jugular single-lumen porta cath with tip terminating in the right atrium. Mediastinum/Nodes: Right hilar lymph node measuring up to 12 mm in short axis. No mediastinal lymphadenopathy. Esophagus is unremarkable in appearance. No axillary lymphadenopathy. Lungs/Pleura: Again noted are innumerable pulmonary nodules, many of which are centrally cavitary. These nodules appear stable and/or increased in size compared to the prior examination, but there are clearly many more new nodules than seen on the prior study. The largest single nodule on today's examination currently measures 18 x 11 mm in the superior segment of the left lower lobe (image 55 of series 4) previously 17 x 7 mm on 02/04/2016. No confluent consolidative airspace disease. Trace right pleural effusion lying dependently, slightly smaller than the prior study.  Musculoskeletal: 17 mm sclerotic lesion in the C7 vertebral body slightly to the left of midline, increasing in size compared to the prior study, compatible with a metastatic lesion. CT ABDOMEN PELVIS FINDINGS Hepatobiliary: 11 mm lesion in segment 7 is similar in size to the prior study, previously characterized as a cavernous hemangioma. Other previously noted 9 mm lesion in segment 6 appears hypervascular on today's study, previously low-attenuation, presumably a small cavernous hemangioma. New ill-defined hypovascular lesion in segment 4B measuring 9 mm (image 66 of series 2) is indeterminate, but suspicious for potential metastasis. No intra or extrahepatic biliary ductal dilatation. Gallbladder is normal in appearance. Pancreas: Enlarging intermediate attenuation lesion with peripheral rim enhancement in the body of the pancreas (image 60 of series 2) measuring 3.0 x 2.6 cm, with distal ductal dilatation throughout the remaining body and tail of the pancreas. Spleen: Unremarkable. Adrenals/Urinary Tract: Bilateral kidneys and bilateral adrenal glands are normal in appearance. There is no hydroureteronephrosis. Urinary bladder is normal in appearance. Stomach/Bowel: Normal appearance of the stomach. No pathologic dilatation of small bowel or colon. Normal appendix. Vascular/Lymphatic: Aortic atherosclerosis, without evidence of aneurysm or dissection in the abdominal or pelvic vasculature. No lymphadenopathy noted in the abdomen  or pelvis. Reproductive: Status post hysterectomy. Bilateral cystic ovarian masses (left greater than right) appear slightly smaller when compared to the prior examination. Right ovarian mass currently measures 4.3 x 2.7 x 2.8 cm. Left ovarian mass currently measures 6.5 x 4.2 x 8.4 cm. Other: Omental caking noted on the prior study is less pronounced on today's examination, and is best visualized in the low anatomic pelvis where it measures up to 9 mm in thickness. No significant  volume of ascites. No pneumoperitoneum. Well-defined low-attenuation lesion beneath the crus of the right hemidiaphragm, similar appearance to prior studies, favored to be a benign lesion (likely a mildly dilated cisterna chyli). Musculoskeletal: There are no aggressive appearing lytic or blastic lesions noted in the visualized portions of the skeleton. IMPRESSION: 1. Today's study demonstrates a mixed response to therapy. Specifically, while the primary ovarian lesions and the omental caking has regressed compared to the prior examination, there is an increased number and size of pulmonary nodules, possible new hepatic lesion, enlarging lesion in the C7 vertebral body, and an enlarging lesion in the distal body of the pancreas. 2. Lesion in the distal body of the pancreas has nonspecific imaging characteristics. This could represent a metastatic lesion. Alternatively, a primary pancreatic neoplasm could be considered. In the appropriate clinical setting, the possibility of a benign lesion such as a pancreatic pseudocyst could be considered, particularly if there is a recent history of pancreatitis. 3. Additional incidental findings, as above. Electronically Signed   By: Vinnie Langton M.D.   On: 04/22/2016 16:33   Ct Abdomen Pelvis W Contrast  Result Date: 04/22/2016 CLINICAL DATA:  81 year old female with history of ovarian cancer diagnosed in September 2017. Ongoing chemotherapy. No current complaints. EXAM: CT CHEST, ABDOMEN, AND PELVIS WITH CONTRAST TECHNIQUE: Multidetector CT imaging of the chest, abdomen and pelvis was performed following the standard protocol during bolus administration of intravenous contrast. CONTRAST:  150m ISOVUE-300 IOPAMIDOL (ISOVUE-300) INJECTION 61% COMPARISON:  CT the chest, abdomen and pelvis 02/04/2016. FINDINGS: CT CHEST FINDINGS Cardiovascular: Heart size is normal. There is no significant pericardial fluid, thickening or pericardial calcification. There is aortic  atherosclerosis, as well as atherosclerosis of the great vessels of the mediastinum and the coronary arteries, including calcified atherosclerotic plaque in the left anterior descending and right coronary arteries. Right internal jugular single-lumen porta cath with tip terminating in the right atrium. Mediastinum/Nodes: Right hilar lymph node measuring up to 12 mm in short axis. No mediastinal lymphadenopathy. Esophagus is unremarkable in appearance. No axillary lymphadenopathy. Lungs/Pleura: Again noted are innumerable pulmonary nodules, many of which are centrally cavitary. These nodules appear stable and/or increased in size compared to the prior examination, but there are clearly many more new nodules than seen on the prior study. The largest single nodule on today's examination currently measures 18 x 11 mm in the superior segment of the left lower lobe (image 55 of series 4) previously 17 x 7 mm on 02/04/2016. No confluent consolidative airspace disease. Trace right pleural effusion lying dependently, slightly smaller than the prior study. Musculoskeletal: 17 mm sclerotic lesion in the C7 vertebral body slightly to the left of midline, increasing in size compared to the prior study, compatible with a metastatic lesion. CT ABDOMEN PELVIS FINDINGS Hepatobiliary: 11 mm lesion in segment 7 is similar in size to the prior study, previously characterized as a cavernous hemangioma. Other previously noted 9 mm lesion in segment 6 appears hypervascular on today's study, previously low-attenuation, presumably a small cavernous hemangioma. New ill-defined hypovascular lesion  in segment 4B measuring 9 mm (image 66 of series 2) is indeterminate, but suspicious for potential metastasis. No intra or extrahepatic biliary ductal dilatation. Gallbladder is normal in appearance. Pancreas: Enlarging intermediate attenuation lesion with peripheral rim enhancement in the body of the pancreas (image 60 of series 2) measuring 3.0 x  2.6 cm, with distal ductal dilatation throughout the remaining body and tail of the pancreas. Spleen: Unremarkable. Adrenals/Urinary Tract: Bilateral kidneys and bilateral adrenal glands are normal in appearance. There is no hydroureteronephrosis. Urinary bladder is normal in appearance. Stomach/Bowel: Normal appearance of the stomach. No pathologic dilatation of small bowel or colon. Normal appendix. Vascular/Lymphatic: Aortic atherosclerosis, without evidence of aneurysm or dissection in the abdominal or pelvic vasculature. No lymphadenopathy noted in the abdomen or pelvis. Reproductive: Status post hysterectomy. Bilateral cystic ovarian masses (left greater than right) appear slightly smaller when compared to the prior examination. Right ovarian mass currently measures 4.3 x 2.7 x 2.8 cm. Left ovarian mass currently measures 6.5 x 4.2 x 8.4 cm. Other: Omental caking noted on the prior study is less pronounced on today's examination, and is best visualized in the low anatomic pelvis where it measures up to 9 mm in thickness. No significant volume of ascites. No pneumoperitoneum. Well-defined low-attenuation lesion beneath the crus of the right hemidiaphragm, similar appearance to prior studies, favored to be a benign lesion (likely a mildly dilated cisterna chyli). Musculoskeletal: There are no aggressive appearing lytic or blastic lesions noted in the visualized portions of the skeleton. IMPRESSION: 1. Today's study demonstrates a mixed response to therapy. Specifically, while the primary ovarian lesions and the omental caking has regressed compared to the prior examination, there is an increased number and size of pulmonary nodules, possible new hepatic lesion, enlarging lesion in the C7 vertebral body, and an enlarging lesion in the distal body of the pancreas. 2. Lesion in the distal body of the pancreas has nonspecific imaging characteristics. This could represent a metastatic lesion. Alternatively, a primary  pancreatic neoplasm could be considered. In the appropriate clinical setting, the possibility of a benign lesion such as a pancreatic pseudocyst could be considered, particularly if there is a recent history of pancreatitis. 3. Additional incidental findings, as above. Electronically Signed   By: Vinnie Langton M.D.   On: 04/22/2016 16:33    ASSESSMENT & PLAN:  Metastatic ovarian cancer She is not ready to begin palliative chemotherapy.  Continue supportive care  Metastatic cancer to the bone She is currently undergoing evaluation with plan to start palliative radiation therapy soon.  I will alert radiation oncologist that she is currently being admitted  Severe cancer pain I have stop ibuprofen I will start her on gentle doses of Dilaudid and will titrate her need for pain medication over the next few days  Severe anemia Likely due to overall bone marrow suppression We discussed some of the risks, benefits, and alternatives of blood transfusions. The patient is symptomatic from anemia and the hemoglobin level is critically low.  Some of the side-effects to be expected including risks of transfusion reactions, chills, infection, syndrome of volume overload and risk of hospitalization from various reasons and the patient is willing to proceed and went ahead to sign consent today. I would give her 2 units of blood today  Leukocytosis and recent low-grade fever Cause is unknown.  I will order blood culture, urine culture and chest x-ray to exclude infection  Severe dehydration with mild renal failure I have stop ibuprofen and her BP meds (HCTZ/lisinopril)  We will start IV fluid resuscitation  Severe protein calorie malnutrition We will consult dietitian and will start her on nutritional supplement.  I will start her on mirtazapine  Malignant hypercalcemia Her corrected serum calcium is high, likely due to malignancy We will start her on IV fluid resuscitation.  We will start her on  calcitonin.  I will consider adding steroids tomorrow  Nausea, vomiting and constipation We will continue bowel regimen  Diabetes, insulin-dependent We will start her on insulin sliding scale but I prefer to give her general diet for now due to recent severe cachexia  Significant debility and weakness We will consult physical therapy tomorrow  Goals of CARE discussion She has rapid declining performance status Previously, despite goals of care discussion with her GYN oncologist, she wants to pursue systemic treatment I will see how she does over the next few days.  Will consider getting palliative care to have further discussion of goals of care with the patient and family  CODE STATUS Full code  DVT prophylaxis Lovenox  Discharge planning Not ready for the next 2-3 days  All questions were answered. The patient knows to call the clinic with any problems, questions or concerns.    Heath Lark, MD 05/11/2016 11:59 AM

## 2016-05-12 LAB — BPAM RBC
BLOOD PRODUCT EXPIRATION DATE: 201803282359
Blood Product Expiration Date: 201804022359
ISSUE DATE / TIME: 201803131804
ISSUE DATE / TIME: 201803131359
UNIT TYPE AND RH: 5100
Unit Type and Rh: 5100

## 2016-05-12 LAB — GLUCOSE, CAPILLARY
GLUCOSE-CAPILLARY: 68 mg/dL (ref 65–99)
Glucose-Capillary: 177 mg/dL — ABNORMAL HIGH (ref 65–99)
Glucose-Capillary: 205 mg/dL — ABNORMAL HIGH (ref 65–99)
Glucose-Capillary: 140 mg/dL — ABNORMAL HIGH (ref 65–99)

## 2016-05-12 LAB — TYPE AND SCREEN
ABO/RH(D): O POS
ANTIBODY SCREEN: NEGATIVE
Unit division: 0
Unit division: 0

## 2016-05-12 LAB — CBC WITH DIFFERENTIAL/PLATELET
BASOS ABS: 0 10*3/uL (ref 0.0–0.1)
BASOS PCT: 0 %
EOS PCT: 0 %
Eosinophils Absolute: 0.1 10*3/uL (ref 0.0–0.7)
HCT: 28 % — ABNORMAL LOW (ref 36.0–46.0)
Hemoglobin: 9.3 g/dL — ABNORMAL LOW (ref 12.0–15.0)
LYMPHS PCT: 15 %
Lymphs Abs: 1.8 10*3/uL (ref 0.7–4.0)
MCH: 24.9 pg — ABNORMAL LOW (ref 26.0–34.0)
MCHC: 33.2 g/dL (ref 30.0–36.0)
MCV: 74.9 fL — AB (ref 78.0–100.0)
MONO ABS: 0.9 10*3/uL (ref 0.1–1.0)
Monocytes Relative: 7 %
Neutro Abs: 9.3 10*3/uL — ABNORMAL HIGH (ref 1.7–7.7)
Neutrophils Relative %: 78 %
PLATELETS: 263 10*3/uL (ref 150–400)
RBC: 3.74 MIL/uL — ABNORMAL LOW (ref 3.87–5.11)
RDW: 15.5 % (ref 11.5–15.5)
WBC: 12 10*3/uL — ABNORMAL HIGH (ref 4.0–10.5)

## 2016-05-12 LAB — COMPREHENSIVE METABOLIC PANEL
ALT: 22 U/L (ref 14–54)
ANION GAP: 7 (ref 5–15)
AST: 22 U/L (ref 15–41)
Albumin: 2.6 g/dL — ABNORMAL LOW (ref 3.5–5.0)
Alkaline Phosphatase: 66 U/L (ref 38–126)
BILIRUBIN TOTAL: 0.8 mg/dL (ref 0.3–1.2)
BUN: 17 mg/dL (ref 6–20)
CALCIUM: 8.8 mg/dL — AB (ref 8.9–10.3)
CHLORIDE: 101 mmol/L (ref 101–111)
CO2: 26 mmol/L (ref 22–32)
Creatinine, Ser: 0.94 mg/dL (ref 0.44–1.00)
GFR, EST NON AFRICAN AMERICAN: 57 mL/min — AB (ref >60)
GLUCOSE: 168 mg/dL — AB (ref 65–99)
POTASSIUM: 4.1 mmol/L (ref 3.5–5.1)
Sodium: 134 mmol/L — ABNORMAL LOW (ref 135–145)
TOTAL PROTEIN: 6.5 g/dL (ref 6.5–8.1)

## 2016-05-12 LAB — CA 125: Cancer Antigen (CA) 125: 137.6 U/mL — ABNORMAL HIGH (ref 0.0–38.1)

## 2016-05-12 MED ORDER — BOOST PLUS PO LIQD
237.0000 mL | Freq: Three times a day (TID) | ORAL | Status: DC
  Administered 2016-05-12: 237 mL via ORAL
  Filled 2016-05-12 (×5): qty 237

## 2016-05-12 NOTE — Progress Notes (Signed)
Leslie Rowe   DOB:Oct 24, 1935   YI#:[id number]    Subjective: She is feeling better.  She has more energy.  Pain is better controlled.  She denies nausea.  She slept well.  Objective:  Vitals:   05/11/16 2023 05/12/16 0404  BP: 121/71 116/61  Pulse: 95 97  Resp: 20 18  Temp: 99.3 F (37.4 C) 98.2 F (36.8 C)     Intake/Output Summary (Last 24 hours) at 05/12/16 0843 Last data filed at 05/12/16 3500  Gross per 24 hour  Intake             1748 ml  Output                0 ml  Net             1748 ml    GENERAL:alert, no distress and comfortable SKIN: skin color, texture, turgor are normal, no rashes or significant lesions EYES: normal, Conjunctiva are pink and non-injected, sclera clear Musculoskeletal:no cyanosis of digits and no clubbing  NEURO: alert & oriented x 3 with fluent speech, no focal motor/sensory deficits   Labs:  Lab Results  Component Value Date   WBC 12.0 (H) 05/12/2016   HGB 9.3 (L) 05/12/2016   HCT 28.0 (L) 05/12/2016   MCV 74.9 (L) 05/12/2016   PLT 263 05/12/2016   NEUTROABS 9.3 (H) 05/12/2016    Lab Results  Component Value Date   NA 134 (L) 05/12/2016   K 4.1 05/12/2016   CL 101 05/12/2016   CO2 26 05/12/2016    Studies:  X-ray Chest Pa And Lateral  Result Date: 05/11/2016 CLINICAL DATA:  Fever EXAM: CHEST  2 VIEW COMPARISON:  CT chest 04/22/2016 FINDINGS: Innumerable pulmonary nodules are seen throughout both lungs. Right jugular Port-A-Cath is stable. Lungs are hyperaerated. Tiny right pleural effusion is stable. No definite new consolidation. No pneumothorax. IMPRESSION: Stable innumerable bilateral pulmonary nodules. Electronically Signed   By: Marybelle Killings M.D.   On: 05/11/2016 11:38    Assessment & Plan:   Metastatic ovarian cancer She is not ready to begin palliative chemotherapy.  Continue supportive care  Metastatic cancer to the bone She is currently undergoing evaluation with plan to start palliative radiation therapy soon.   I will alert radiation oncologist that she is currently being admitted  Severe cancer pain, better controlled I have stopped ibuprofen I will start her on gentle doses of Dilaudid and will titrate her need for pain medication over the next few days This is better today  Severe anemia, status post blood transfusion Likely due to overall bone marrow suppression She received 2 units of blood transfusion on 05/11/2016.  Stable blood count today  Leukocytosis and recent low-grade fever, likely due to UTI Urinalysis is grossly abnormal.  Urine culture is pending.  She is started on IV antibiotic yesterday with improvement of total white blood cell count Continue antibiotics  Severe dehydration with mild renal failure, improved I have stop ibuprofen and her BP meds (HCTZ/lisinopril) We will start IV fluid resuscitation  Severe protein calorie malnutrition We will consult dietitian and will start her on nutritional supplement.  I will start her on mirtazapine  Malignant hypercalcemia, resolved Her corrected serum calcium is high, likely due to malignancy Continue close monitoring; this is improving with IV fluid resuscitation and calcitonin  I will hold steroid therapy  Nausea, vomiting and constipation We will continue bowel regimen This is improved  Diabetes, insulin-dependent We will start her on insulin sliding  scale but I prefer to give her general diet for now due to recent severe cachexia  Significant debility and weakness We will consult physical therapy today  Goals of CARE discussion She has rapid declining performance status Previously, despite goals of care discussion with her GYN oncologist, she wants to pursue systemic treatment I will see how she does over the next few days.  Will consider getting palliative care to have further discussion of goals of care with the patient and family  CODE STATUS Full code  DVT prophylaxis Lovenox  Discharge  planning Hopefully the next 24-48 hours if she continues to improve   Heath Lark, MD 05/12/2016  8:43 AM

## 2016-05-12 NOTE — Evaluation (Signed)
Physical Therapy Evaluation Patient Details Name: Leslie Rowe MRN: [id number] DOB: 1936-01-03 Today's Date: 05/12/2016   History of Present Illness  Pt admitted from Cancer center with 2* dehydration, anemia and uncontrolled pain.  Pt with hx of metastatic ovarian Cancer and DM  Clinical Impression  Pt admitted as above and presenting with functional mobility limitations 2* generalized weakness and mild ambulatory balance deficits.  Pt should progress to dc home with assist of family.    Follow Up Recommendations No PT follow up    Equipment Recommendations  None recommended by PT    Recommendations for Other Services       Precautions / Restrictions Precautions Precautions: Fall Restrictions Weight Bearing Restrictions: No      Mobility  Bed Mobility Overal bed mobility: Modified Independent                Transfers Overall transfer level: Needs assistance   Transfers: Sit to/from Stand Sit to Stand: Supervision            Ambulation/Gait Ambulation/Gait assistance: Min guard Ambulation Distance (Feet): 500 Feet Assistive device: None Gait Pattern/deviations: WFL(Within Functional Limits);Wide base of support     General Gait Details: mild intermittent instability with no loss of balance  Stairs            Wheelchair Mobility    Modified Rankin (Stroke Patients Only)       Balance Overall balance assessment: Needs assistance Sitting-balance support: No upper extremity supported;Feet supported Sitting balance-Leahy Scale: Good     Standing balance support: No upper extremity supported Standing balance-Leahy Scale: Fair                               Pertinent Vitals/Pain Pain Assessment: No/denies pain    Home Living Family/patient expects to be discharged to:: Private residence Living Arrangements: Children Available Help at Discharge: Family;Available 24 hours/day Type of Home: House Home Access: Stairs to  enter   [employer] of Steps: 1 Home Layout: Able to live on main level with bedroom/bathroom Home Equipment: None      Prior Function Level of Independence: Independent               Hand Dominance        Extremity/Trunk Assessment   Upper Extremity Assessment Upper Extremity Assessment: Generalized weakness    Lower Extremity Assessment Lower Extremity Assessment: Generalized weakness    Cervical / Trunk Assessment Cervical / Trunk Assessment: Normal  Communication   Communication: No difficulties  Cognition Arousal/Alertness: Awake/alert Behavior During Therapy: WFL for tasks assessed/performed Overall Cognitive Status: Within Functional Limits for tasks assessed                      General Comments      Exercises     Assessment/Plan    PT Assessment Patient needs continued PT services  PT Problem List Decreased strength;Decreased activity tolerance;Decreased mobility;Decreased balance;Decreased knowledge of use of DME       PT Treatment Interventions DME instruction;Gait training;Stair training;Functional mobility training;Therapeutic activities;Therapeutic exercise;Patient/family education    PT Goals (Current goals can be found in the Care Plan section)  Acute Rehab PT Goals Patient Stated Goal: Regain strength to return home PT Goal Formulation: With patient Time For Goal Achievement: 05/19/16 Potential to Achieve Goals: Good    Frequency Min 3X/week   Barriers to discharge        Co-evaluation  End of Session Equipment Utilized During Treatment: Gait belt Activity Tolerance: Patient tolerated treatment well Patient left: in chair;with call bell/phone within reach;with chair alarm set;with family/visitor present Nurse Communication: Mobility status PT Visit Diagnosis: Unsteadiness on feet (R26.81)         Time: 6967-8938 PT Time Calculation (min) (ACUTE ONLY): 13 min   Charges:   PT  Evaluation $PT Eval Low Complexity: 1 Procedure     PT G Codes:         Cordarro Spinnato 05/12/2016, 2:47 PM

## 2016-05-12 NOTE — Progress Notes (Signed)
Initial Nutrition Assessment  DOCUMENTATION CODES:   Severe malnutrition in context of chronic illness  INTERVENTION:   Provide Boost Plus chocolate TID- Each supplement provides 360kcal and 14g protein.   Encouraged small, frequent meals RD to continue to monitor  NUTRITION DIAGNOSIS:   Malnutrition related to chronic illness as evidenced by 7% percent weight loss x 2 months, severe depletion of muscle mass.  GOAL:   Patient will meet greater than or equal to 90% of their needs  MONITOR:   PO intake, Supplement acceptance, Labs, Weight trends, I & O's  REASON FOR ASSESSMENT:   Malnutrition Screening Tool    ASSESSMENT:    81 y.o. female is admitted for further management of multiple symptoms. Dehydration, severe anemia, leukocytosis, hypercalcemia, uncontrolled pain due to metastatic ovarian cancer.  Patient in room sleeping, RD was able to wake pt to gather history. Pt states she has had poor appetite for ~1 week. Pt developed a stomach virus 1 week ago when symptoms began. Mainly subsisting on 1 Boost High Protein a day with some water during that time. This morning the patient states she ate a cup of yogurt for breakfast. Her daughter is planning on visiting her and pt states her daughter will order her lunch meal at that time. Pt with Boost High Protein at bedside untouched. Pt states she will drink it later and prefers her supplements room temperature. Pt agreed with RD's plan to have Boost Plus provided to patient. RD to order. Pt denies difficulties swallowing, chewing or any taste changes.  Per chart review, pt has lost 10 lb since 1/19 (7% wt loss x 2 months, significant for time frame). Nutrition-Focused physical exam completed. Findings are no fat depletion, severe muscle depletion, and no edema. Pt states she feels generally weak but not specifically with ADLs or in extremities.  Medications: Remeron tablet daily   Labs reviewed: CBGs: 146-212 Low Na  Diet  Order:  Diet regular Room service appropriate? Yes; Fluid consistency: Thin  Skin:  Reviewed, no issues  Last BM:  3/14  Height:   Ht Readings from Last 1 Encounters:  05/11/16 5\' 7"  (1.702 m)    Weight:   Wt Readings from Last 1 Encounters:  05/11/16 131 lb 8 oz (59.6 kg)    Ideal Body Weight:  61.3 kg  BMI:  Body mass index is 20.6 kg/m.  Estimated Nutritional Needs:   Kcal:  7782-4235  Protein:  80-90g  Fluid:  1.9L/day  EDUCATION NEEDS:   Education needs addressed  Clayton Bibles, MS, RD, LDN Pager: [phone] After Hours Pager: [phone]

## 2016-05-13 ENCOUNTER — Telehealth: Payer: Self-pay | Admitting: Oncology

## 2016-05-13 ENCOUNTER — Other Ambulatory Visit: Payer: Self-pay | Admitting: Hematology and Oncology

## 2016-05-13 ENCOUNTER — Ambulatory Visit: Payer: Medicare Other | Admitting: Radiation Oncology

## 2016-05-13 ENCOUNTER — Ambulatory Visit
Admission: RE | Admit: 2016-05-13 | Discharge: 2016-05-13 | Disposition: A | Discharge status: Home / Self Care | Payer: Medicare Other | Source: Ambulatory Visit | Attending: Radiation Oncology | Admitting: Radiation Oncology

## 2016-05-13 DIAGNOSIS — C561 Malignant neoplasm of right ovary: Secondary | ICD-10-CM

## 2016-05-13 DIAGNOSIS — C7800 Secondary malignant neoplasm of unspecified lung: Secondary | ICD-10-CM

## 2016-05-13 DIAGNOSIS — E43 Unspecified severe protein-calorie malnutrition: Secondary | ICD-10-CM

## 2016-05-13 DIAGNOSIS — C7951 Secondary malignant neoplasm of bone: Secondary | ICD-10-CM | POA: Diagnosis not present

## 2016-05-13 DIAGNOSIS — C569 Malignant neoplasm of unspecified ovary: Secondary | ICD-10-CM

## 2016-05-13 LAB — COMPREHENSIVE METABOLIC PANEL
ALBUMIN: 2.7 g/dL — AB (ref 3.5–5.0)
ALK PHOS: 65 U/L (ref 38–126)
ALT: 23 U/L (ref 14–54)
ANION GAP: 8 (ref 5–15)
AST: 29 U/L (ref 15–41)
BUN: 13 mg/dL (ref 6–20)
CALCIUM: 9 mg/dL (ref 8.9–10.3)
CHLORIDE: 106 mmol/L (ref 101–111)
CO2: 25 mmol/L (ref 22–32)
Creatinine, Ser: 0.71 mg/dL (ref 0.44–1.00)
GFR calc non Af Amer: >60 mL/min (ref >60)
GLUCOSE: 174 mg/dL — AB (ref 65–99)
Potassium: 4.1 mmol/L (ref 3.5–5.1)
SODIUM: 139 mmol/L (ref 135–145)
Total Bilirubin: 0.4 mg/dL (ref 0.3–1.2)
Total Protein: 6.3 g/dL — ABNORMAL LOW (ref 6.5–8.1)

## 2016-05-13 LAB — GLUCOSE, CAPILLARY
Glucose-Capillary: 156 mg/dL — ABNORMAL HIGH (ref 65–99)
Glucose-Capillary: 203 mg/dL — ABNORMAL HIGH (ref 65–99)

## 2016-05-13 LAB — CBC WITH DIFFERENTIAL/PLATELET
Basophils Absolute: 0 10*3/uL (ref 0.0–0.1)
Basophils Relative: 0 %
EOS ABS: 0.1 10*3/uL (ref 0.0–0.7)
Eosinophils Relative: 1 %
HCT: 28 % — ABNORMAL LOW (ref 36.0–46.0)
HEMOGLOBIN: 9.3 g/dL — AB (ref 12.0–15.0)
LYMPHS PCT: 17 %
Lymphs Abs: 1.8 10*3/uL (ref 0.7–4.0)
MCH: 25.1 pg — ABNORMAL LOW (ref 26.0–34.0)
MCHC: 33.2 g/dL (ref 30.0–36.0)
MCV: 75.5 fL — ABNORMAL LOW (ref 78.0–100.0)
MONO ABS: 1.1 10*3/uL — AB (ref 0.1–1.0)
Monocytes Relative: 10 %
NEUTROS PCT: 72 %
Neutro Abs: 7.8 10*3/uL — ABNORMAL HIGH (ref 1.7–7.7)
PLATELETS: 270 10*3/uL (ref 150–400)
RBC: 3.71 MIL/uL — ABNORMAL LOW (ref 3.87–5.11)
RDW: 15.7 % — ABNORMAL HIGH (ref 11.5–15.5)
WBC: 10.8 10*3/uL — ABNORMAL HIGH (ref 4.0–10.5)

## 2016-05-13 MED ORDER — HEPARIN SOD (PORK) LOCK FLUSH 100 UNIT/ML IV SOLN
500.0000 [IU] | Freq: Once | INTRAVENOUS | Status: DC
  Filled 2016-05-13: qty 5

## 2016-05-13 MED ORDER — MIRTAZAPINE 15 MG PO TABS: 15.0000 mg | ORAL_TABLET | Freq: Every day | ORAL | 9 refills | Status: DC

## 2016-05-13 MED ORDER — HYDROMORPHONE HCL 2 MG PO TABS: 2.0000 mg | ORAL_TABLET | ORAL | 0 refills | Status: DC | PRN

## 2016-05-13 MED ORDER — ENSURE ENLIVE PO LIQD: 237.0000 mL | Freq: Two times a day (BID) | ORAL | 12 refills | Status: DC

## 2016-05-13 NOTE — Discharge Summary (Signed)
Physician Discharge Summary  Patient ID: Leslie Rowe MRN: [id number] DOB/AGE: 1935-08-07 81 y.o.  Admit date: 05/11/2016 Discharge date: 05/13/2016  Primary Care Physician:  Hoyt Koch, MD   Discharge Diagnoses:    Present on Admission: . Ovarian cancer on right ([place]) . Malignant neoplasm metastatic to lung ([place]) . Anemia due to antineoplastic chemotherapy . Metastasis to bone ([place]) . Lower abdominal pain . Hypercalcemia   Discharge Medications:  Allergies as of 05/13/2016      Reactions   Codeine Nausea And Vomiting      Medication List    TAKE these medications   feeding supplement (ENSURE ENLIVE) Liqd Take 237 mLs by mouth 2 (two) times daily between meals.   HYDROmorphone 2 MG tablet Commonly known as:  DILAUDID Take 1 tablet (2 mg total) by mouth every 4 (four) hours as needed for severe pain.   insulin lispro 100 UNIT/ML KiwkPen Commonly known as:  HUMALOG KWIKPEN Check sugars and if: 0-150 take no insulin, 151-250 take 2 units, 251-300 take 4 units, 301-400, take 6 units, >400 call office. What changed:  how much to take  how to take this  when to take this  reasons to take this  additional instructions   lidocaine-prilocaine cream Commonly known as:  EMLA Apply to PortaCath 1-2 hrs prior to access as directed to numb area.   lisinopril-hydrochlorothiazide 20-12.5 MG tablet Commonly known as:  PRINZIDE,ZESTORETIC take 1 tablet by mouth once daily   loratadine 10 MG tablet Commonly known as:  CLARITIN Take 10 mg by mouth daily as needed for allergies.   LORazepam 0.5 MG tablet Commonly known as:  ATIVAN Place 1 tablet under the tongue or swallow every 6 hrs as needed for nausea. Will make drowsy. What changed:  how much to take  how to take this  when to take this  reasons to take this  additional instructions   magnesium hydroxide 400 MG/5ML suspension Commonly known as:  MILK OF MAGNESIA Take 30 mLs by  mouth daily as needed for mild constipation.   Melatonin 3 MG Caps Take 3 mg by mouth at bedtime as needed (for sleep).   metFORMIN 1000 MG tablet Commonly known as:  GLUCOPHAGE take 1 tablet by mouth twice a day with food   mirtazapine 15 MG tablet Commonly known as:  REMERON Take 1 tablet (15 mg total) by mouth at bedtime.   multivitamin with minerals Tabs tablet Take 1 tablet by mouth daily.   ondansetron 8 MG tablet Commonly known as:  ZOFRAN Take 1 tablet (8 mg total) by mouth every 8 (eight) hours as needed for nausea or vomiting (Will not make drowsy.).   polyethylene glycol packet Commonly known as:  MIRALAX / GLYCOLAX Take 17 g by mouth daily as needed for mild constipation.       Disposition and Follow-up:   Significant Diagnostic Studies:  X-ray Chest Pa And Lateral  Result Date: 05/11/2016 CLINICAL DATA:  Fever EXAM: CHEST  2 VIEW COMPARISON:  CT chest 04/22/2016 FINDINGS: Innumerable pulmonary nodules are seen throughout both lungs. Right jugular Port-A-Cath is stable. Lungs are hyperaerated. Tiny right pleural effusion is stable. No definite new consolidation. No pneumothorax. IMPRESSION: Stable innumerable bilateral pulmonary nodules. Electronically Signed   By: Marybelle Killings M.D.   On: 05/11/2016 11:38   Ct Chest W Contrast  Result Date: 04/22/2016 CLINICAL DATA:  81 year old female with history of ovarian cancer diagnosed in September 2017. Ongoing chemotherapy. No current complaints. EXAM: CT  CHEST, ABDOMEN, AND PELVIS WITH CONTRAST TECHNIQUE: Multidetector CT imaging of the chest, abdomen and pelvis was performed following the standard protocol during bolus administration of intravenous contrast. CONTRAST:  146mL ISOVUE-300 IOPAMIDOL (ISOVUE-300) INJECTION 61% COMPARISON:  CT the chest, abdomen and pelvis 02/04/2016. FINDINGS: CT CHEST FINDINGS Cardiovascular: Heart size is normal. There is no significant pericardial fluid, thickening or pericardial  calcification. There is aortic atherosclerosis, as well as atherosclerosis of the great vessels of the mediastinum and the coronary arteries, including calcified atherosclerotic plaque in the left anterior descending and right coronary arteries. Right internal jugular single-lumen porta cath with tip terminating in the right atrium. Mediastinum/Nodes: Right hilar lymph node measuring up to 12 mm in short axis. No mediastinal lymphadenopathy. Esophagus is unremarkable in appearance. No axillary lymphadenopathy. Lungs/Pleura: Again noted are innumerable pulmonary nodules, many of which are centrally cavitary. These nodules appear stable and/or increased in size compared to the prior examination, but there are clearly many more new nodules than seen on the prior study. The largest single nodule on today's examination currently measures 18 x 11 mm in the superior segment of the left lower lobe (image 55 of series 4) previously 17 x 7 mm on 02/04/2016. No confluent consolidative airspace disease. Trace right pleural effusion lying dependently, slightly smaller than the prior study. Musculoskeletal: 17 mm sclerotic lesion in the C7 vertebral body slightly to the left of midline, increasing in size compared to the prior study, compatible with a metastatic lesion. CT ABDOMEN PELVIS FINDINGS Hepatobiliary: 11 mm lesion in segment 7 is similar in size to the prior study, previously characterized as a cavernous hemangioma. Other previously noted 9 mm lesion in segment 6 appears hypervascular on today's study, previously low-attenuation, presumably a small cavernous hemangioma. New ill-defined hypovascular lesion in segment 4B measuring 9 mm (image 66 of series 2) is indeterminate, but suspicious for potential metastasis. No intra or extrahepatic biliary ductal dilatation. Gallbladder is normal in appearance. Pancreas: Enlarging intermediate attenuation lesion with peripheral rim enhancement in the body of the pancreas (image  60 of series 2) measuring 3.0 x 2.6 cm, with distal ductal dilatation throughout the remaining body and tail of the pancreas. Spleen: Unremarkable. Adrenals/Urinary Tract: Bilateral kidneys and bilateral adrenal glands are normal in appearance. There is no hydroureteronephrosis. Urinary bladder is normal in appearance. Stomach/Bowel: Normal appearance of the stomach. No pathologic dilatation of small bowel or colon. Normal appendix. Vascular/Lymphatic: Aortic atherosclerosis, without evidence of aneurysm or dissection in the abdominal or pelvic vasculature. No lymphadenopathy noted in the abdomen or pelvis. Reproductive: Status post hysterectomy. Bilateral cystic ovarian masses (left greater than right) appear slightly smaller when compared to the prior examination. Right ovarian mass currently measures 4.3 x 2.7 x 2.8 cm. Left ovarian mass currently measures 6.5 x 4.2 x 8.4 cm. Other: Omental caking noted on the prior study is less pronounced on today's examination, and is best visualized in the low anatomic pelvis where it measures up to 9 mm in thickness. No significant volume of ascites. No pneumoperitoneum. Well-defined low-attenuation lesion beneath the crus of the right hemidiaphragm, similar appearance to prior studies, favored to be a benign lesion (likely a mildly dilated cisterna chyli). Musculoskeletal: There are no aggressive appearing lytic or blastic lesions noted in the visualized portions of the skeleton. IMPRESSION: 1. Today's study demonstrates a mixed response to therapy. Specifically, while the primary ovarian lesions and the omental caking has regressed compared to the prior examination, there is an increased number and size of pulmonary nodules,  possible new hepatic lesion, enlarging lesion in the C7 vertebral body, and an enlarging lesion in the distal body of the pancreas. 2. Lesion in the distal body of the pancreas has nonspecific imaging characteristics. This could represent a metastatic  lesion. Alternatively, a primary pancreatic neoplasm could be considered. In the appropriate clinical setting, the possibility of a benign lesion such as a pancreatic pseudocyst could be considered, particularly if there is a recent history of pancreatitis. 3. Additional incidental findings, as above. Electronically Signed   By: Vinnie Langton M.D.   On: 04/22/2016 16:33   Ct Abdomen Pelvis W Contrast  Result Date: 04/22/2016 CLINICAL DATA:  81 year old female with history of ovarian cancer diagnosed in September 2017. Ongoing chemotherapy. No current complaints. EXAM: CT CHEST, ABDOMEN, AND PELVIS WITH CONTRAST TECHNIQUE: Multidetector CT imaging of the chest, abdomen and pelvis was performed following the standard protocol during bolus administration of intravenous contrast. CONTRAST:  173mL ISOVUE-300 IOPAMIDOL (ISOVUE-300) INJECTION 61% COMPARISON:  CT the chest, abdomen and pelvis 02/04/2016. FINDINGS: CT CHEST FINDINGS Cardiovascular: Heart size is normal. There is no significant pericardial fluid, thickening or pericardial calcification. There is aortic atherosclerosis, as well as atherosclerosis of the great vessels of the mediastinum and the coronary arteries, including calcified atherosclerotic plaque in the left anterior descending and right coronary arteries. Right internal jugular single-lumen porta cath with tip terminating in the right atrium. Mediastinum/Nodes: Right hilar lymph node measuring up to 12 mm in short axis. No mediastinal lymphadenopathy. Esophagus is unremarkable in appearance. No axillary lymphadenopathy. Lungs/Pleura: Again noted are innumerable pulmonary nodules, many of which are centrally cavitary. These nodules appear stable and/or increased in size compared to the prior examination, but there are clearly many more new nodules than seen on the prior study. The largest single nodule on today's examination currently measures 18 x 11 mm in the superior segment of the left lower  lobe (image 55 of series 4) previously 17 x 7 mm on 02/04/2016. No confluent consolidative airspace disease. Trace right pleural effusion lying dependently, slightly smaller than the prior study. Musculoskeletal: 17 mm sclerotic lesion in the C7 vertebral body slightly to the left of midline, increasing in size compared to the prior study, compatible with a metastatic lesion. CT ABDOMEN PELVIS FINDINGS Hepatobiliary: 11 mm lesion in segment 7 is similar in size to the prior study, previously characterized as a cavernous hemangioma. Other previously noted 9 mm lesion in segment 6 appears hypervascular on today's study, previously low-attenuation, presumably a small cavernous hemangioma. New ill-defined hypovascular lesion in segment 4B measuring 9 mm (image 66 of series 2) is indeterminate, but suspicious for potential metastasis. No intra or extrahepatic biliary ductal dilatation. Gallbladder is normal in appearance. Pancreas: Enlarging intermediate attenuation lesion with peripheral rim enhancement in the body of the pancreas (image 60 of series 2) measuring 3.0 x 2.6 cm, with distal ductal dilatation throughout the remaining body and tail of the pancreas. Spleen: Unremarkable. Adrenals/Urinary Tract: Bilateral kidneys and bilateral adrenal glands are normal in appearance. There is no hydroureteronephrosis. Urinary bladder is normal in appearance. Stomach/Bowel: Normal appearance of the stomach. No pathologic dilatation of small bowel or colon. Normal appendix. Vascular/Lymphatic: Aortic atherosclerosis, without evidence of aneurysm or dissection in the abdominal or pelvic vasculature. No lymphadenopathy noted in the abdomen or pelvis. Reproductive: Status post hysterectomy. Bilateral cystic ovarian masses (left greater than right) appear slightly smaller when compared to the prior examination. Right ovarian mass currently measures 4.3 x 2.7 x 2.8 cm. Left ovarian mass  currently measures 6.5 x 4.2 x 8.4 cm. Other:  Omental caking noted on the prior study is less pronounced on today's examination, and is best visualized in the low anatomic pelvis where it measures up to 9 mm in thickness. No significant volume of ascites. No pneumoperitoneum. Well-defined low-attenuation lesion beneath the crus of the right hemidiaphragm, similar appearance to prior studies, favored to be a benign lesion (likely a mildly dilated cisterna chyli). Musculoskeletal: There are no aggressive appearing lytic or blastic lesions noted in the visualized portions of the skeleton. IMPRESSION: 1. Today's study demonstrates a mixed response to therapy. Specifically, while the primary ovarian lesions and the omental caking has regressed compared to the prior examination, there is an increased number and size of pulmonary nodules, possible new hepatic lesion, enlarging lesion in the C7 vertebral body, and an enlarging lesion in the distal body of the pancreas. 2. Lesion in the distal body of the pancreas has nonspecific imaging characteristics. This could represent a metastatic lesion. Alternatively, a primary pancreatic neoplasm could be considered. In the appropriate clinical setting, the possibility of a benign lesion such as a pancreatic pseudocyst could be considered, particularly if there is a recent history of pancreatitis. 3. Additional incidental findings, as above. Electronically Signed   By: Vinnie Langton M.D.   On: 04/22/2016 16:33    Discharge Laboratory Values: Lab Results  Component Value Date   WBC 10.8 (H) 05/13/2016   HGB 9.3 (L) 05/13/2016   HCT 28.0 (L) 05/13/2016   MCV 75.5 (L) 05/13/2016   PLT 270 05/13/2016   Lab Results  Component Value Date   NA 139 05/13/2016   K 4.1 05/13/2016   CL 106 05/13/2016   CO2 25 05/13/2016    Brief H and P: For complete details please refer to admission H and P, but in brief, she was admitted for further management of dehydration, severe anemia, leukocytosis, hypercalcemia,  uncontrolled pain due to metastatic ovarian cancer. Hospital course:  Metastatic ovarian cancer She is not ready to begin palliative chemotherapy. Continue supportive care  Metastatic cancer to the bone She is currently undergoing evaluation with plan to start palliative radiation therapy soon.   Severe cancer pain, better controlled I have stopped ibuprofen Pain control has improved. I have prescribed low dose Dilaudid for her to take at home  Severe anemia, status post blood transfusion Likely due to overall bone marrow suppression She received 2 units of blood transfusion on 05/11/2016.  Stable blood count since  Leukocytosis and recent low-grade fever, likely due to UTI Urinalysis is grossly abnormal.  Urine culture is pending.  She is started on IV antibiotic with improvement. Treatment is stopped upon discharge  Severe dehydration with mild renal failure, improved I have stopped ibuprofen and her BP meds (HCTZ/lisinopril) Her dehydration resolved. Her BP medication will be resumed after DC  Severe protein calorie malnutrition We have consulted dietitian and will start her on nutritional supplement. I have started her on mirtazapine  Malignant hypercalcemia, resolved Her corrected serum calcium is high, likely due to malignancy Continue close monitoring; this is improving with IV fluid resuscitation and calcitonin   Nausea, vomiting and constipation We will continue bowel regimen This is improved  Diabetes, insulin-dependent She was placed on insulin sliding scale   Significant debility and weakness She was assessed by PT   Physical Exam at Discharge: BP 120/78 (BP Location: Left Arm)   Pulse 93   Temp 98.5 F (36.9 C) (Oral)   Resp 20  Ht 5\' 7"  (1.702 m)   Wt 131 lb 8 oz (59.6 kg)   SpO2 100%   BMI 20.60 kg/m  GENERAL:alert, no distress and comfortable SKIN: skin color, texture, turgor are normal, no rashes or significant lesions EYES: normal,  Conjunctiva are pink and non-injected, sclera clear OROPHARYNX:no exudate, no erythema and lips, buccal mucosa, and tongue normal  NECK: supple, thyroid normal size, non-tender, without nodularity LYMPH:  no palpable lymphadenopathy in the cervical, axillary or inguinal LUNGS: clear to auscultation and percussion with normal breathing effort HEART: regular rate & rhythm and no murmurs and no lower extremity edema ABDOMEN:abdomen soft, non-tender and normal bowel sounds Musculoskeletal:no cyanosis of digits and no clubbing  NEURO: alert & oriented x 3 with fluent speech, no focal motor/sensory deficits  Hospital Course:  Active Problems:   Ovarian cancer on right [facility])   Malignant neoplasm metastatic to lung ([facility])   Anemia due to antineoplastic chemotherapy   Metastasis to bone [facility])   Lower abdominal pain   Hypercalcemia   Diet:  Regular  Activity:  As tolerated  Condition at Discharge:   stable  Spent 35 minutes on discharge Signed: Dr. Heath Lark [phone]  05/13/2016, 8:29 AM

## 2016-05-13 NOTE — Telephone Encounter (Addendum)
Called [address] [place] and talked to [place], Therapist, sports.  Asked if patient could have her radiation appointment at 2 pm instead of 5 pm.  She said that would be fine.

## 2016-05-13 NOTE — Progress Notes (Signed)
  Radiation Oncology         ([phone]) [phone] ________________________________  Name: Leslie Rowe MRN: [id number]  Date: 05/13/2016  DOB: Jul 07, 1935  Simulation Verification Note    ICD-9-CM ICD-10-CM   1. International Federation of Gyne[employer] and Obstetrics (FIGO) stage IVB epithelial ovarian cancer ([place]) 183.0 C56.9     Status: outpatient  NARRATIVE: The patient was brought to the treatment unit and placed in the planned treatment position. The clinical setup was verified. Then port films were obtained and uploaded to the radiation oncology medical record software.  The treatment beams were carefully compared against the planned radiation fields. The position location and shape of the radiation fields was reviewed. They targeted volume of tissue appears to be appropriately covered by the radiation beams. Organs at risk appear to be excluded as planned.  Based on my personal review, I approved the simulation verification. The patient's treatment will proceed as planned.  -----------------------------------  Blair Promise, PhD, MD

## 2016-05-13 NOTE — Progress Notes (Signed)
PT Cancellation Note  Patient Details Name: Leslie Rowe MRN: [id number] DOB: 1935-07-08   Cancelled Treatment:     RN reports pt going to Radiation then D/C to home.     Rica Koyanagi  PTA [facility]  Acute  Rehab Pager      [phone]

## 2016-05-14 ENCOUNTER — Telehealth: Payer: Self-pay | Admitting: Hematology and Oncology

## 2016-05-14 LAB — URINE CULTURE

## 2016-05-14 NOTE — Telephone Encounter (Signed)
Called and left message letting patient know that lab and flush appt have been added to the schedule per sch message 05/13/2016 from MD.

## 2016-05-16 LAB — CULTURE, BLOOD (ROUTINE X 2)
Culture: NO GROWTH
Culture: NO GROWTH

## 2016-05-17 ENCOUNTER — Ambulatory Visit
Admission: RE | Admit: 2016-05-17 | Discharge: 2016-05-17 | Disposition: A | Discharge status: Home / Self Care | Payer: Medicare Other | Source: Ambulatory Visit | Attending: Radiation Oncology | Admitting: Radiation Oncology

## 2016-05-17 DIAGNOSIS — C569 Malignant neoplasm of unspecified ovary: Secondary | ICD-10-CM | POA: Diagnosis present

## 2016-05-17 DIAGNOSIS — Z510 Encounter for antineoplastic radiation therapy: Secondary | ICD-10-CM | POA: Diagnosis not present

## 2016-05-17 DIAGNOSIS — R918 Other nonspecific abnormal finding of lung field: Secondary | ICD-10-CM | POA: Diagnosis not present

## 2016-05-17 DIAGNOSIS — C7951 Secondary malignant neoplasm of bone: Secondary | ICD-10-CM | POA: Diagnosis not present

## 2016-05-18 ENCOUNTER — Ambulatory Visit
Admission: RE | Admit: 2016-05-18 | Discharge: 2016-05-18 | Disposition: A | Discharge status: Home / Self Care | Payer: Medicare Other | Source: Ambulatory Visit | Attending: Radiation Oncology | Admitting: Radiation Oncology

## 2016-05-18 ENCOUNTER — Encounter: Payer: Self-pay | Admitting: Radiation Oncology

## 2016-05-18 VITALS — BP 117/67 | HR 104 | Temp 97.6°F | Ht 67.0 in | Wt 131.6 lb

## 2016-05-18 DIAGNOSIS — C7951 Secondary malignant neoplasm of bone: Secondary | ICD-10-CM | POA: Diagnosis not present

## 2016-05-18 DIAGNOSIS — R918 Other nonspecific abnormal finding of lung field: Secondary | ICD-10-CM | POA: Diagnosis not present

## 2016-05-18 DIAGNOSIS — Z510 Encounter for antineoplastic radiation therapy: Secondary | ICD-10-CM | POA: Diagnosis not present

## 2016-05-18 NOTE — Progress Notes (Signed)
Leslie Rowe presents for her 2nd fraction of radiation to her C7 Cervical Spine. She denies pain, but tells me that she did have some "stiffness" in her neck this morning. She has a decreased appetite, and is not eating well. She has completed chemotherapy and will start back after finishing radiation. She denies any other concerns at this time.   BP 117/67   Pulse (!) 104   Temp 97.6 F (36.4 C)   Ht 5\' 7"  (1.702 m)   Wt 131 lb 9.6 oz (59.7 kg)   SpO2 100% Comment: room air  BMI 20.61 kg/m    Wt Readings from Last 3 Encounters:  05/18/16 131 lb 9.6 oz (59.7 kg)  05/11/16 131 lb 8 oz (59.6 kg)  05/11/16 131 lb 8 oz (59.6 kg)

## 2016-05-18 NOTE — Progress Notes (Signed)
  Radiation Oncology         (336) [phone] ________________________________  Name: Leslie Rowe MRN: [id number]  Date: 05/18/2016  DOB: 1935/11/13  Weekly Radiation Therapy Management    ICD-9-CM ICD-10-CM   1. Metastasis to bone ([facility]) 198.5 C79.51      Current Dose: 5 Gy     Planned Dose:  35 Gy  Narrative . . . . . . . . The patient presents for routine under treatment assessment.  [name] presents having completed 2 fractions of radiation to her C7 Cervical Spine. She denies pain, but does endorse some stiffness in her neck upon waking up this morning. Pt endorses decreased appetite. Pt denies breathing difficulty.                                     The patient is without complaint.                                 Set-up films were reviewed.                                 The chart was checked. Physical Findings. . .  height is 5\' 7"  (1.702 m) and weight is 131 lb 9.6 oz (59.7 kg). Her temperature is 97.6 F (36.4 C). Her blood pressure is 117/67 and her pulse is 104 (abnormal). Her oxygen saturation is 100%.  Weight essentially stable.  No significant changes. Lungs are clear to auscultation bilaterally. Heart shows normal rate and rhythm. Oropharynx is clear. Impression . . . . . . . The patient is tolerating radiation. Plan . . . . . . . . . . . . Continue treatment as planned. Advised pt she may experience a sore throat toward the end of treatment as well as some darkening of the skin and hoarseness.  ________________________________   Blair Promise, PhD, MD  This document serves as a record of services personally performed by Gery Pray, MD. It was created on his behalf by Maryla Morrow, a trained medical scribe. The creation of this record is based on the scribe's personal observations and the provider's statements to them. This document has been checked and approved by the attending provider.

## 2016-05-19 ENCOUNTER — Ambulatory Visit
Admission: RE | Admit: 2016-05-19 | Discharge: 2016-05-19 | Disposition: A | Discharge status: Home / Self Care | Payer: Medicare Other | Source: Ambulatory Visit | Attending: Radiation Oncology | Admitting: Radiation Oncology

## 2016-05-19 ENCOUNTER — Ambulatory Visit: Admission: RE | Admit: 2016-05-19 | Payer: Medicare Other | Source: Ambulatory Visit

## 2016-05-19 DIAGNOSIS — Z510 Encounter for antineoplastic radiation therapy: Secondary | ICD-10-CM | POA: Diagnosis not present

## 2016-05-19 DIAGNOSIS — C7951 Secondary malignant neoplasm of bone: Secondary | ICD-10-CM | POA: Diagnosis not present

## 2016-05-19 DIAGNOSIS — R918 Other nonspecific abnormal finding of lung field: Secondary | ICD-10-CM | POA: Diagnosis not present

## 2016-05-20 ENCOUNTER — Ambulatory Visit
Admission: RE | Admit: 2016-05-20 | Discharge: 2016-05-20 | Disposition: A | Discharge status: Home / Self Care | Payer: Medicare Other | Source: Ambulatory Visit | Attending: Radiation Oncology | Admitting: Radiation Oncology

## 2016-05-20 DIAGNOSIS — Z510 Encounter for antineoplastic radiation therapy: Secondary | ICD-10-CM | POA: Diagnosis not present

## 2016-05-20 DIAGNOSIS — R918 Other nonspecific abnormal finding of lung field: Secondary | ICD-10-CM | POA: Diagnosis not present

## 2016-05-20 DIAGNOSIS — C7951 Secondary malignant neoplasm of bone: Secondary | ICD-10-CM | POA: Diagnosis not present

## 2016-05-21 ENCOUNTER — Ambulatory Visit
Admission: RE | Admit: 2016-05-21 | Discharge: 2016-05-21 | Disposition: A | Discharge status: Home / Self Care | Payer: Medicare Other | Source: Ambulatory Visit | Attending: Radiation Oncology | Admitting: Radiation Oncology

## 2016-05-21 DIAGNOSIS — Z510 Encounter for antineoplastic radiation therapy: Secondary | ICD-10-CM | POA: Diagnosis not present

## 2016-05-21 DIAGNOSIS — C7951 Secondary malignant neoplasm of bone: Secondary | ICD-10-CM | POA: Diagnosis not present

## 2016-05-21 DIAGNOSIS — R918 Other nonspecific abnormal finding of lung field: Secondary | ICD-10-CM | POA: Diagnosis not present

## 2016-05-24 ENCOUNTER — Ambulatory Visit
Admission: RE | Admit: 2016-05-24 | Discharge: 2016-05-24 | Disposition: A | Discharge status: Home / Self Care | Payer: Medicare Other | Source: Ambulatory Visit | Attending: Radiation Oncology | Admitting: Radiation Oncology

## 2016-05-24 DIAGNOSIS — R918 Other nonspecific abnormal finding of lung field: Secondary | ICD-10-CM | POA: Diagnosis not present

## 2016-05-24 DIAGNOSIS — Z510 Encounter for antineoplastic radiation therapy: Secondary | ICD-10-CM | POA: Diagnosis not present

## 2016-05-24 DIAGNOSIS — C7951 Secondary malignant neoplasm of bone: Secondary | ICD-10-CM | POA: Diagnosis not present

## 2016-05-25 ENCOUNTER — Ambulatory Visit
Admission: RE | Admit: 2016-05-25 | Discharge: 2016-05-25 | Disposition: A | Discharge status: Home / Self Care | Payer: Medicare Other | Source: Ambulatory Visit | Attending: Radiation Oncology | Admitting: Radiation Oncology

## 2016-05-25 ENCOUNTER — Encounter: Payer: Self-pay | Admitting: Radiation Oncology

## 2016-05-25 VITALS — BP 100/67 | HR 118 | Temp 97.7°F | Ht 67.0 in | Wt 128.8 lb

## 2016-05-25 DIAGNOSIS — Z510 Encounter for antineoplastic radiation therapy: Secondary | ICD-10-CM | POA: Diagnosis not present

## 2016-05-25 DIAGNOSIS — C7951 Secondary malignant neoplasm of bone: Secondary | ICD-10-CM | POA: Insufficient documentation

## 2016-05-25 DIAGNOSIS — R918 Other nonspecific abnormal finding of lung field: Secondary | ICD-10-CM | POA: Diagnosis not present

## 2016-05-25 DIAGNOSIS — C801 Malignant (primary) neoplasm, unspecified: Secondary | ICD-10-CM

## 2016-05-25 MED ORDER — BIAFINE EX EMUL
Freq: Once | CUTANEOUS | Status: AC
  Administered 2016-05-25: 15:00:00 via TOPICAL

## 2016-05-25 NOTE — Progress Notes (Signed)
  Radiation Oncology         [phone]) [phone] ________________________________  Name: Leslie Rowe MRN: [id number]  Date: 05/25/2016  DOB: 1935-05-28  Weekly Radiation Therapy Management    ICD-9-CM ICD-10-CM   1. Metastasis to bone ([facility]) 198.5 C79.51      Current Dose: 17.5 Gy     Planned Dose:  35 Gy  Narrative . . . . . . . . The patient presents for routine under treatment assessment.  Leslie Rowe has completed 7 fractions to her cervical spine (C7).  She reports feeling "crummy" today and said she has no energy or appetite.  She continues to drink two ensures per day and said it is very hard to eat anything.  She denies having a sore throat or trouble swallowing. She has lost 3 lbs since last week.  Orthostatic vitals taken: bp sitting 118/63 hr 111, bp standing 100/67, hr 118. She does report that her neck is stiff.  She also reports having pain in her abdomen at night.  The skin in the treatment area is intact.  She has been given biafine to start using.                                    The patient is without complaint.                                 Set-up films were reviewed.                                 The chart was checked. Physical Findings. . .  height is 5\' 7"  (1.702 m) and weight is 128 lb 12.8 oz (58.4 kg). Her oral temperature is 97.7 F (36.5 C). Her blood pressure is 100/67 and her pulse is 118 (abnormal). Her oxygen saturation is 98%.   Weight loss noted. Lungs are clear to auscultation bilaterally. Heart shows regular rhythm with an INCREASED rate. Neck area showed hyperpigmentation changes. Impression . . . . . . . The patient is tolerating radiation. Feels poorly, but it does not seem to be caused from her radiation. She has a poor appetite. Plan . . . . . . . . . . . . Continue treatment as planned. The patient will force fluids and her daughter will give her Pedialyte. The patient will also see Dr. Alvy Bimler tomorrow for blood work and to see if the patient  needs additional IV supplementation.  ________________________________   Blair Promise, PhD, MD  This document serves as a record of services personally performed by Gery Pray, MD. It was created on his behalf by Darcus Austin, a trained medical scribe. The creation of this record is based on the scribe's personal observations and the provider's statements to them. This document has been checked and approved by the attending provider.

## 2016-05-25 NOTE — Progress Notes (Addendum)
Paislee has completed 7 fractions to her cervical spine (C7).  She reports feeling "crummy" today and said she has no energy or appetite.  She continues to drink two ensures per day and said it is very hard to eat anything.  She denies having a sore throat or trouble swallowing.  She has lost 3 lbs since last week.  Orthostatic vitals taken: bp sitting 118/63 hr 111, bp standing 100/67, hr 118. She does report that her neck is stiff.  She also reports having pain in her abdomen at night.  The skin in the treatment area is intact.  She has been given biafine to start using.  BP 118/63 (BP Location: Right Arm, Patient Position: Sitting)   Pulse (!) 111   Temp 97.7 F (36.5 C) (Oral)   Ht 5\' 7"  (1.702 m)   Wt 128 lb 12.8 oz (58.4 kg)   SpO2 98%   BMI 20.17 kg/m    Wt Readings from Last 3 Encounters:  05/25/16 128 lb 12.8 oz (58.4 kg)  05/18/16 131 lb 9.6 oz (59.7 kg)  05/11/16 131 lb 8 oz (59.6 kg)

## 2016-05-25 NOTE — Addendum Note (Signed)
Encounter addended by: Jacqulyn Liner, RN on: 05/25/2016  3:11 PM<BR>    Actions taken: Order list changed, Diagnosis association updated, MAR administration accepted

## 2016-05-26 ENCOUNTER — Other Ambulatory Visit (HOSPITAL_BASED_OUTPATIENT_CLINIC_OR_DEPARTMENT_OTHER): Payer: Medicare Other

## 2016-05-26 ENCOUNTER — Telehealth: Payer: Self-pay | Admitting: Hematology and Oncology

## 2016-05-26 ENCOUNTER — Ambulatory Visit (HOSPITAL_BASED_OUTPATIENT_CLINIC_OR_DEPARTMENT_OTHER): Payer: Medicare Other

## 2016-05-26 ENCOUNTER — Other Ambulatory Visit: Payer: Self-pay | Admitting: Hematology and Oncology

## 2016-05-26 ENCOUNTER — Ambulatory Visit
Admission: RE | Admit: 2016-05-26 | Discharge: 2016-05-26 | Disposition: A | Discharge status: Home / Self Care | Payer: Medicare Other | Source: Ambulatory Visit | Attending: Radiation Oncology | Admitting: Radiation Oncology

## 2016-05-26 ENCOUNTER — Encounter: Payer: Self-pay | Admitting: Hematology and Oncology

## 2016-05-26 ENCOUNTER — Ambulatory Visit: Payer: Medicare Other

## 2016-05-26 ENCOUNTER — Ambulatory Visit (HOSPITAL_BASED_OUTPATIENT_CLINIC_OR_DEPARTMENT_OTHER): Payer: Medicare Other | Admitting: Hematology and Oncology

## 2016-05-26 VITALS — BP 115/64 | HR 99

## 2016-05-26 DIAGNOSIS — N390 Urinary tract infection, site not specified: Secondary | ICD-10-CM | POA: Insufficient documentation

## 2016-05-26 DIAGNOSIS — C7800 Secondary malignant neoplasm of unspecified lung: Secondary | ICD-10-CM | POA: Diagnosis not present

## 2016-05-26 DIAGNOSIS — C7951 Secondary malignant neoplasm of bone: Secondary | ICD-10-CM

## 2016-05-26 DIAGNOSIS — C786 Secondary malignant neoplasm of retroperitoneum and peritoneum: Secondary | ICD-10-CM

## 2016-05-26 DIAGNOSIS — D72829 Elevated white blood cell count, unspecified: Secondary | ICD-10-CM

## 2016-05-26 DIAGNOSIS — R918 Other nonspecific abnormal finding of lung field: Secondary | ICD-10-CM | POA: Diagnosis not present

## 2016-05-26 DIAGNOSIS — K8689 Other specified diseases of pancreas: Secondary | ICD-10-CM

## 2016-05-26 DIAGNOSIS — C801 Malignant (primary) neoplasm, unspecified: Secondary | ICD-10-CM

## 2016-05-26 DIAGNOSIS — R634 Abnormal weight loss: Secondary | ICD-10-CM

## 2016-05-26 DIAGNOSIS — C561 Malignant neoplasm of right ovary: Secondary | ICD-10-CM

## 2016-05-26 DIAGNOSIS — R630 Anorexia: Secondary | ICD-10-CM | POA: Diagnosis not present

## 2016-05-26 DIAGNOSIS — D6481 Anemia due to antineoplastic chemotherapy: Secondary | ICD-10-CM | POA: Diagnosis not present

## 2016-05-26 DIAGNOSIS — R64 Cachexia: Secondary | ICD-10-CM

## 2016-05-26 DIAGNOSIS — T451X5A Adverse effect of antineoplastic and immunosuppressive drugs, initial encounter: Secondary | ICD-10-CM

## 2016-05-26 DIAGNOSIS — I959 Hypotension, unspecified: Secondary | ICD-10-CM | POA: Diagnosis not present

## 2016-05-26 DIAGNOSIS — R8290 Unspecified abnormal findings in urine: Secondary | ICD-10-CM | POA: Diagnosis not present

## 2016-05-26 DIAGNOSIS — C569 Malignant neoplasm of unspecified ovary: Secondary | ICD-10-CM

## 2016-05-26 DIAGNOSIS — Z95828 Presence of other vascular implants and grafts: Secondary | ICD-10-CM

## 2016-05-26 DIAGNOSIS — Z510 Encounter for antineoplastic radiation therapy: Secondary | ICD-10-CM | POA: Diagnosis not present

## 2016-05-26 DIAGNOSIS — C482 Malignant neoplasm of peritoneum, unspecified: Secondary | ICD-10-CM

## 2016-05-26 LAB — COMPREHENSIVE METABOLIC PANEL
ALBUMIN: 3.1 g/dL — AB (ref 3.5–5.0)
ALK PHOS: 91 U/L (ref 40–150)
ALT: 22 U/L (ref 0–55)
AST: 22 U/L (ref 5–34)
Anion Gap: 14 mEq/L — ABNORMAL HIGH (ref 3–11)
BUN: 26 mg/dL (ref 7.0–26.0)
CO2: 27 mEq/L (ref 22–29)
CREATININE: 1.2 mg/dL — AB (ref 0.6–1.1)
Calcium: 10.8 mg/dL — ABNORMAL HIGH (ref 8.4–10.4)
Chloride: 95 mEq/L — ABNORMAL LOW (ref 98–109)
EGFR: 51 mL/min/{1.73_m2} — AB (ref >90)
GLUCOSE: 254 mg/dL — AB (ref 70–140)
Potassium: 4.6 mEq/L (ref 3.5–5.1)
Sodium: 136 mEq/L (ref 136–145)
TOTAL PROTEIN: 7.8 g/dL (ref 6.4–8.3)
Total Bilirubin: 0.62 mg/dL (ref 0.20–1.20)

## 2016-05-26 LAB — URINALYSIS, MICROSCOPIC - CHCC
BILIRUBIN (URINE): NEGATIVE
BLOOD: NEGATIVE
Glucose: NEGATIVE mg/dL
KETONES: NEGATIVE mg/dL
NITRITE: NEGATIVE
Protein: <30 mg/dL
Specific Gravity, Urine: 1.01 (ref 1.003–1.035)
Urobilinogen, UR: 0.2 mg/dL (ref 0.2–1)
pH: 7 (ref 4.6–8.0)

## 2016-05-26 LAB — CBC WITH DIFFERENTIAL/PLATELET
BASO%: 0.3 % (ref 0.0–2.0)
Basophils Absolute: 0 10*3/uL (ref 0.0–0.1)
EOS ABS: 0.1 10*3/uL (ref 0.0–0.5)
EOS%: 0.8 % (ref 0.0–7.0)
HCT: 31.9 % — ABNORMAL LOW (ref 34.8–46.6)
HEMOGLOBIN: 10.2 g/dL — AB (ref 11.6–15.9)
LYMPH%: 10.2 % — ABNORMAL LOW (ref 14.0–49.7)
MCH: 25 pg — ABNORMAL LOW (ref 25.1–34.0)
MCHC: 32 g/dL (ref 31.5–36.0)
MCV: 78.1 fL — ABNORMAL LOW (ref 79.5–101.0)
MONO#: 1.2 10*3/uL — ABNORMAL HIGH (ref 0.1–0.9)
MONO%: 8.3 % (ref 0.0–14.0)
NEUT%: 80.4 % — ABNORMAL HIGH (ref 38.4–76.8)
NEUTROS ABS: 11.8 10*3/uL — AB (ref 1.5–6.5)
Platelets: 291 10*3/uL (ref 145–400)
RBC: 4.08 10*6/uL (ref 3.70–5.45)
RDW: 15.4 % — AB (ref 11.2–14.5)
WBC: 14.7 10*3/uL — AB (ref 3.9–10.3)
lymph#: 1.5 10*3/uL (ref 0.9–3.3)

## 2016-05-26 MED ORDER — PREDNISONE 10 MG PO TABS: 10.0000 mg | ORAL_TABLET | Freq: Every day | ORAL | 1 refills | Status: DC

## 2016-05-26 MED ORDER — HEPARIN SOD (PORK) LOCK FLUSH 100 UNIT/ML IV SOLN
500.0000 [IU] | Freq: Once | INTRAVENOUS | Status: DC
  Filled 2016-05-26: qty 5

## 2016-05-26 MED ORDER — SODIUM CHLORIDE 0.9 % IV SOLN
Freq: Once | INTRAVENOUS | Status: AC
  Administered 2016-05-26: 13:00:00 via INTRAVENOUS

## 2016-05-26 MED ORDER — HEPARIN SOD (PORK) LOCK FLUSH 100 UNIT/ML IV SOLN
500.0000 [IU] | Freq: Once | INTRAVENOUS | Status: AC | PRN
  Administered 2016-05-26: 500 [IU]
  Filled 2016-05-26: qty 5

## 2016-05-26 MED ORDER — SODIUM CHLORIDE 0.9% FLUSH
10.0000 mL | INTRAVENOUS | Status: DC | PRN
  Administered 2016-05-26: 10 mL
  Filled 2016-05-26: qty 10

## 2016-05-26 MED ORDER — SODIUM CHLORIDE 0.9% FLUSH
10.0000 mL | Freq: Once | INTRAVENOUS | Status: AC
  Administered 2016-05-26: 10 mL
  Filled 2016-05-26: qty 10

## 2016-05-26 MED ORDER — CIPROFLOXACIN HCL 250 MG PO TABS: 250.0000 mg | ORAL_TABLET | Freq: Two times a day (BID) | ORAL | 0 refills | Status: DC

## 2016-05-26 NOTE — Assessment & Plan Note (Signed)
There is a component of anemia of chronic disease from recent chemotheraoy. The patient denies recent history of bleeding such as epistaxis, hematuria or hematochezia. She is asymptomatic from the anemia. We will observe for now.  Recent iron studies show high ferritin. I recommend she stops taking iron supplement as it contributes to severe constipation.

## 2016-05-26 NOTE — Assessment & Plan Note (Signed)
Recent imaging study documented disease progression including progression of lung metastasis, bone metastasis and pancreatic nodule. She is currently undergoing palliative radiation treatment and continues to decline clinically with mild pain/discomfort, malignant cachexia, progressive weight loss and feeling bad She also has started to experience mild symptoms of esophagitis I will continue to see her on a weekly basis to provide supportive care Today, with very low blood pressure, and signs of dehydration, I recommend potential admission for aggressive supportive care, IV fluid resuscitation and IV antibiotics for possible recurrent UTI The patient declined I will give her IV fluids daily and see her on the weekly basis in the outpatient

## 2016-05-26 NOTE — Telephone Encounter (Signed)
appts added per 05/26/2016 los. Patient is in treatment area and will receive schedule after treatment.

## 2016-05-26 NOTE — Progress Notes (Signed)
[facility] OFFICE PROGRESS NOTE  Patient Care Team: Hoyt Koch, MD as PCP - General (Internal Medicine) Fredderick Erb, MD as Consulting Physician (Ophthalmology)  SUMMARY OF ONCOLOGIC HISTORY:   Primary peritoneal carcinomatosis ([place])   11/14/2015 Initial Diagnosis    Primary peritoneal carcinomatosis ([place]). Stage IV based on pulmonary nodules.       Ovarian cancer on right [facility])   11/07/2015 Imaging    Multiple pulmonary nodules bilaterally compatible with pulmonary metastases. Peritoneal carcinomatosis with tumor deposits throughout omentum and at the pericolic gutters as well as within the pelvis. BILATERAL cystic and solid ovarian masses, predominately cystic on LEFT and predominately solid on RIGHT, question ovarian neoplasms versus tumor deposits at the ovaries bilaterally from peritoneal carcinomatosis. Low-attenuation retrocrural nodes. Small RIGHT pleural effusion and minimal perihepatic ascites. Question metastasis at pancreatic tail. Sigmoid diverticulosis. BILATERAL inguinal hernias containing fat. Aortic atherosclerosis.       11/12/2015 Tumor Marker    Patient's tumor was tested for the following markers: CA125. Results of the tumor marker test revealed 301.8.      11/14/2015 Procedure    Peritoneal tumor just deep to the right abdominal wall was targeted. A tangential approach was performed with needle insertion to the left of midline. Three core biopsy needle passes resulted in 2 intact core biopsy specimens and 1 partial tissue fragment.  IMPRESSION: CT-guided core biopsy performed of peritoneal tumor.      11/14/2015 Pathology Results    Soft Tissue Needle Core Biopsy, Peritoneal Cavity - ADENOCARCINOMA. - SEE MICROSCOPIC DESCRIPTION. Microscopic Comment The morphologic features are compatible with metastatic ovarian cancer. There is sufficient material for additional studies if requested. (JDP:gt, 11/17/15) PROGNOSTIC  INDICATORS Results: IMMUNOHISTOCHEMICAL AND MORPHOMETRIC ANALYSIS PERFORMED MANUALLY Estrogen Receptor: 80%, POSITIVE, STRONG STAINING INTENSITY Progesterone Receptor: 10%, POSITIVE, STRONG STAINING INTENSITY REFERENCE RANGE ESTROGEN RECEPTOR NEGATIVE 0% POSITIVE =>1% REFERENCE RANGE PROGESTERONE RECEPTOR NEGATIVE 0% POSITIVE =>1% All controls stained appropriately      12/05/2015 Tumor Marker    Patient's tumor was tested for the following markers: CA125 Results of the tumor marker test revealed 469.2      12/10/2015 Imaging    Innumerable pulmonary nodules of variable size compatible with diffuse pulmonary metastatic disease. Nodules appear minimally larger and there are some new tiny pulmonary nodules in the lung bases compared to 11/07/2015. 2. Moderate and enlarging right pleural effusion. 3. Extensive peritoneal carcinomatosis in the upper abdomen. Possible tumor involving the pancreatic tail. 4. Borderline prominent right hilar lymph node.      12/12/2015 - 03/26/2016 Chemotherapy    She has received 6 cycles of carboplatin & taxol       12/25/2015 Tumor Marker    Patient's tumor was tested for the following markers: CA125 Results of the tumor marker test revealed 739.7      01/01/2016 Procedure    Ultrasound and fluoroscopically guided right internal jugular single lumen power port catheter insertion. Tip in the SVC/RA junction. Catheter ready for use      01/19/2016 Tumor Marker    Patient's tumor was tested for the following markers: CA125 Results of the tumor marker test revealed 421.9      02/04/2016 Imaging    CT CHEST IMPRESSION  1. Slight improvement in pulmonary metastasis. 2. Decrease in low left cervical/supraclavicular adenopathy, favoring response to therapy. 3. Decrease in small right pleural effusion.  CT ABDOMEN AND PELVIS IMPRESSION  1. Improvement in omental/peritoneal metastasis. 2. Enlarged cystic lesion within the left hemipelvis could  represent a primary ovarian malignancy or metastasis. Right ovarian soft tissue fullness and heterogeneity is not significantly changed. 3. Pancreatic body/tail junction hypoattenuation is difficult to quantify secondary to morphology. However, felt to be increased in conspicuity today. Considerations include metastatic disease or a somewhat atypical distribution of pancreatic adenocarcinoma with metastasis. 4. Slight improvement in right common iliac high adenopathy. 5. Aortic atherosclerosis.       02/04/2016 Genetic Testing    Genetics testing normal 02-04-16 (298 gene myRisk panel by [employer]). Tumor BRACAnalysis and Genomic Instability Status Testing was also performed through [employer], negative      02/05/2016 Tumor Marker    Patient's tumor was tested for the following markers: CA125 Results of the tumor marker test revealed 307.1      03/04/2016 Tumor Marker    Patient's tumor was tested for the following markers: CA125 Results of the tumor marker test revealed 180.9      03/26/2016 Tumor Marker    Patient's tumor was tested for the following markers: CA125 Results of the tumor marker test revealed 132.8      04/15/2016 Tumor Marker    Patient's tumor was tested for the following markers: CA125 Results of the tumor marker test revealed 109.4      04/22/2016 Imaging    Today's study demonstrates a mixed response to therapy. Specifically, while the primary ovarian lesions and the omental caking has regressed compared to the prior examination, there is an increased number and size of pulmonary nodules, possible new hepatic lesion, enlarging lesion in the C7 vertebral body, and an enlarging lesion in the distal body of the pancreas. 2. Lesion in the distal body of the pancreas has nonspecific imaging characteristics. This could represent a metastatic lesion. Alternatively, a primary pancreatic neoplasm could be considered. In the appropriate clinical setting, the  possibility of a benign lesion such as a pancreatic pseudocyst could be considered, particularly if there is a recent history of pancreatitis.      05/11/2016 Tumor Marker    Patient's tumor was tested for the following markers: CA125 Results of the tumor marker test revealed 137.6       INTERVAL HISTORY: Please see below for problem oriented charting. She returns for further follow-up with her daughter She has progressive decline since the last time I saw her She denies fever or chills She has very poor appetite, nausea and progressive weight loss She complain of fatigue She complained of mild esophagitis Denies constipation  REVIEW OF SYSTEMS:   Constitutional: Denies fevers, chills  Eyes: Denies blurriness of vision Ears, nose, mouth, throat, and face: Denies mucositis or sore throat Respiratory: Denies cough, dyspnea or wheezes Cardiovascular: Denies palpitation, chest discomfort or lower extremity swelling Skin: Denies abnormal skin rashes Lymphatics: Denies new lymphadenopathy or easy bruising Neurological:Denies numbness, tingling or new weaknesses Behavioral/Psych: Mood is stable, no new changes  All other systems were reviewed with the patient and are negative.  I have reviewed the past medical history, past surgical history, social history and family history with the patient and they are unchanged from previous note.  ALLERGIES:  is allergic to codeine.  MEDICATIONS:  Current Outpatient Prescriptions  Medication Sig Dispense Refill  . ciprofloxacin (CIPRO) 250 MG tablet Take 1 tablet (250 mg total) by mouth 2 (two) times daily. 14 tablet 0  . feeding supplement, ENSURE ENLIVE, (ENSURE ENLIVE) LIQD Take 237 mLs by mouth 2 (two) times daily between meals. 237 mL 12  . HYDROmorphone (DILAUDID) 2 MG tablet Take  1 tablet (2 mg total) by mouth every 4 (four) hours as needed for severe pain. 30 tablet 0  . ibuprofen (ADVIL,MOTRIN) 200 MG tablet Take 200 mg by mouth every 6  (six) hours as needed.    . insulin lispro (HUMALOG KWIKPEN) 100 UNIT/ML KiwkPen Check sugars and if: 0-150 take no insulin, 151-250 take 2 units, 251-300 take 4 units, 301-400, take 6 units, >400 call office. (Patient taking differently: Inject 0-6 Units into the skin at bedtime as needed (for high blood sugar). Check sugars and if: 0-150 take no insulin, 151-250 take 2 units, 251-300 take 4 units, 301-400, take 6 units, >400 call office.) 15 mL 11  . lidocaine-prilocaine (EMLA) cream Apply to PortaCath 1-2 hrs prior to access as directed to numb area. 30 g 1  . lisinopril-hydrochlorothiazide (PRINZIDE,ZESTORETIC) 20-12.5 MG tablet take 1 tablet by mouth once daily 90 tablet 3  . loratadine (CLARITIN) 10 MG tablet Take 10 mg by mouth daily as needed for allergies.    Marland Kitchen LORazepam (ATIVAN) 0.5 MG tablet Place 1 tablet under the tongue or swallow every 6 hrs as needed for nausea. Will make drowsy. (Patient taking differently: Take 0.5 mg by mouth every 6 (six) hours as needed (for nausea). ) 30 tablet 0  . magnesium hydroxide (MILK OF MAGNESIA) 400 MG/5ML suspension Take 30 mLs by mouth daily as needed for mild constipation.    . Melatonin 3 MG CAPS Take 3 mg by mouth at bedtime as needed (for sleep).     . metFORMIN (GLUCOPHAGE) 1000 MG tablet take 1 tablet by mouth twice a day with food 180 tablet 3  . mirtazapine (REMERON) 15 MG tablet Take 1 tablet (15 mg total) by mouth at bedtime. 30 tablet 9  . Multiple Vitamin (MULTIVITAMIN WITH MINERALS) TABS tablet Take 1 tablet by mouth daily.    . ondansetron (ZOFRAN) 8 MG tablet Take 1 tablet (8 mg total) by mouth every 8 (eight) hours as needed for nausea or vomiting (Will not make drowsy.). 30 tablet 1  . polyethylene glycol (MIRALAX / GLYCOLAX) packet Take 17 g by mouth daily as needed for mild constipation.     . predniSONE (DELTASONE) 10 MG tablet Take 1 tablet (10 mg total) by mouth daily with breakfast. 30 tablet 1   No current facility-administered  medications for this visit.    Facility-Administered Medications Ordered in Other Visits  Medication Dose Route Frequency Provider Last Rate Last Dose  . 0.9 %  sodium chloride infusion   Intravenous Continuous Jacqulynn Cadet, MD      . sodium chloride flush (NS) 0.9 % injection 10 mL  10 mL Intracatheter PRN Heath Lark, MD   10 mL at 05/26/16 1504    PHYSICAL EXAMINATION: ECOG PERFORMANCE STATUS: 2 - Symptomatic, <50% confined to bed  Vitals:   05/26/16 1109  BP: 93/64  Pulse: 87  Resp: 18  Temp: 97.6 F (36.4 C)   Filed Weights   05/26/16 1109  Weight: 129 lb 3.2 oz (58.6 kg)    GENERAL:alert, no distress and comfortable.  She looks frail, thin and cachectic SKIN: skin color, texture, turgor are normal, no rashes or significant lesions EYES: normal, Conjunctiva are pink and non-injected, sclera clear OROPHARYNX:no exudate, no erythema and lips, buccal mucosa, and tongue normal  NECK: supple, thyroid normal size, non-tender, without nodularity LYMPH:  no palpable lymphadenopathy in the cervical, axillary or inguinal LUNGS: clear to auscultation and percussion with normal breathing effort HEART: regular rate & rhythm  and no murmurs and no lower extremity edema ABDOMEN:abdomen soft, non-tender and normal bowel sounds Musculoskeletal:no cyanosis of digits and no clubbing  NEURO: alert & oriented x 3 with fluent speech, no focal motor/sensory deficits  LABORATORY DATA:  I have reviewed the data as listed    Component Value Date/Time   NA 136 05/26/2016 1030   K 4.6 05/26/2016 1030   CL 106 05/13/2016 0500   CO2 27 05/26/2016 1030   GLUCOSE 254 (H) 05/26/2016 1030   BUN 26.0 05/26/2016 1030   CREATININE 1.2 (H) 05/26/2016 1030   CALCIUM 10.8 (H) 05/26/2016 1030   PROT 7.8 05/26/2016 1030   ALBUMIN 3.1 (L) 05/26/2016 1030   AST 22 05/26/2016 1030   ALT 22 05/26/2016 1030   ALKPHOS 91 05/26/2016 1030   BILITOT 0.62 05/26/2016 1030   GFRNONAA >60 05/13/2016 0500    GFRAA >60 05/13/2016 0500    No results found for: SPEP, UPEP  Lab Results  Component Value Date   WBC 14.7 (H) 05/26/2016   NEUTROABS 11.8 (H) 05/26/2016   HGB 10.2 (L) 05/26/2016   HCT 31.9 (L) 05/26/2016   MCV 78.1 (L) 05/26/2016   PLT 291 05/26/2016      Chemistry      Component Value Date/Time   NA 136 05/26/2016 1030   K 4.6 05/26/2016 1030   CL 106 05/13/2016 0500   CO2 27 05/26/2016 1030   BUN 26.0 05/26/2016 1030   CREATININE 1.2 (H) 05/26/2016 1030   GLU 191 (H) 02/13/2016 1448      Component Value Date/Time   CALCIUM 10.8 (H) 05/26/2016 1030   ALKPHOS 91 05/26/2016 1030   AST 22 05/26/2016 1030   ALT 22 05/26/2016 1030   BILITOT 0.62 05/26/2016 1030       RADIOGRAPHIC STUDIES: I have personally reviewed the radiological images as listed and agreed with the findings in the report. X-ray Chest Pa And Lateral  Result Date: 05/11/2016 CLINICAL DATA:  Fever EXAM: CHEST  2 VIEW COMPARISON:  CT chest 04/22/2016 FINDINGS: Innumerable pulmonary nodules are seen throughout both lungs. Right jugular Port-A-Cath is stable. Lungs are hyperaerated. Tiny right pleural effusion is stable. No definite new consolidation. No pneumothorax. IMPRESSION: Stable innumerable bilateral pulmonary nodules. Electronically Signed   By: Marybelle Killings M.D.   On: 05/11/2016 11:38    ASSESSMENT & PLAN:  Ovarian cancer on right [facility]) Recent imaging study documented disease progression including progression of lung metastasis, bone metastasis and pancreatic nodule. She is currently undergoing palliative radiation treatment and continues to decline clinically with mild pain/discomfort, malignant cachexia, progressive weight loss and feeling bad She also has started to experience mild symptoms of esophagitis I will continue to see her on a weekly basis to provide supportive care Today, with very low blood pressure, and signs of dehydration, I recommend potential admission for aggressive  supportive care, IV fluid resuscitation and IV antibiotics for possible recurrent UTI The patient declined I will give her IV fluids daily and see her on the weekly basis in the outpatient  Anemia due to antineoplastic chemotherapy There is a component of anemia of chronic disease from recent chemotheraoy. The patient denies recent history of bleeding such as epistaxis, hematuria or hematochezia. She is asymptomatic from the anemia. We will observe for now.  Recent iron studies show high ferritin. I recommend she stops taking iron supplement as it contributes to severe constipation.  UTI (urinary tract infection) She has signs of recurrent UTI with leukocytosis, poor appetite  and abnormal urine test Based on recent culture and sensitivity I plan to give her another course of antibiotic with ciprofloxacin for 1 week and plan to recheck her urine test again next week I emphasized the importance of increase oral intake  Malignant cachexia ([place]) Her appetite is very poor due to malignant cachexia She has not seen much benefit with Remeron Recommend addition of low-dose prednisone daily I warn her with risk of hyperglycemia and reflux disease and recommend she takes the with food I will reassess next week   Orders Placed This Encounter  Procedures  . Urine culture    Standing Status:   Future    Standing Expiration Date:   06/30/2017  . Urinalysis, Microscopic - [facility]    Standing Status:   Future    Standing Expiration Date:   06/30/2017   All questions were answered. The patient knows to call the clinic with any problems, questions or concerns. No barriers to learning was detected. I spent 25 minutes counseling the patient face to face. The total time spent in the appointment was 40 minutes and more than 50% was on counseling and review of test results     Heath Lark, MD 05/26/2016 3:54 PM

## 2016-05-26 NOTE — Telephone Encounter (Signed)
Patient had to leave for Radiation. Will follow up and contact patient when appts are scheduled. Message Normajean Baxter for capped days to see if appts per 05/26/2016 los can be added.

## 2016-05-26 NOTE — Assessment & Plan Note (Signed)
She has signs of recurrent UTI with leukocytosis, poor appetite and abnormal urine test Based on recent culture and sensitivity I plan to give her another course of antibiotic with ciprofloxacin for 1 week and plan to recheck her urine test again next week I emphasized the importance of increase oral intake

## 2016-05-26 NOTE — Patient Instructions (Signed)
Implanted Port Home Guide An implanted port is a type of central line that is placed under the skin. Central lines are used to provide IV access when treatment or nutrition needs to be given through a person's veins. Implanted ports are used for long-term IV access. An implanted port may be placed because:  You need IV medicine that would be irritating to the small veins in your hands or arms.  You need long-term IV medicines, such as antibiotics.  You need IV nutrition for a long period.  You need frequent blood draws for lab tests.  You need dialysis.  Implanted ports are usually placed in the chest area, but they can also be placed in the upper arm, the abdomen, or the leg. An implanted port has two main parts:  Reservoir. The reservoir is round and will appear as a small, raised area under your skin. The reservoir is the part where a needle is inserted to give medicines or draw blood.  Catheter. The catheter is a thin, flexible tube that extends from the reservoir. The catheter is placed into a large vein. Medicine that is inserted into the reservoir goes into the catheter and then into the vein.  How will I care for my incision site? Do not get the incision site wet. Bathe or shower as directed by your health care provider. How is my port accessed? Special steps must be taken to access the port:  Before the port is accessed, a numbing cream can be placed on the skin. This helps numb the skin over the port site.  Your health care provider uses a sterile technique to access the port. ? Your health care provider must put on a mask and sterile gloves. ? The skin over your port is cleaned carefully with an antiseptic and allowed to dry. ? The port is gently pinched between sterile gloves, and a needle is inserted into the port.  Only "non-coring" port needles should be used to access the port. Once the port is accessed, a blood return should be checked. This helps ensure that the port  is in the vein and is not clogged.  If your port needs to remain accessed for a constant infusion, a clear (transparent) bandage will be placed over the needle site. The bandage and needle will need to be changed every week, or as directed by your health care provider.  Keep the bandage covering the needle clean and dry. Do not get it wet. Follow your health care provider's instructions on how to take a shower or bath while the port is accessed.  If your port does not need to stay accessed, no bandage is needed over the port.  What is flushing? Flushing helps keep the port from getting clogged. Follow your health care provider's instructions on how and when to flush the port. Ports are usually flushed with saline solution or a medicine called heparin. The need for flushing will depend on how the port is used.  If the port is used for intermittent medicines or blood draws, the port will need to be flushed: ? After medicines have been given. ? After blood has been drawn. ? As part of routine maintenance.  If a constant infusion is running, the port may not need to be flushed.  How long will my port stay implanted? The port can stay in for as long as your health care provider thinks it is needed. When it is time for the port to come out, surgery will be   done to remove it. The procedure is similar to the one performed when the port was put in. When should I seek immediate medical care? When you have an implanted port, you should seek immediate medical care if:  You notice a bad smell coming from the incision site.  You have swelling, redness, or drainage at the incision site.  You have more swelling or pain at the port site or the surrounding area.  You have a fever that is not controlled with medicine.  This information is not intended to replace advice given to you by your health care provider. Make sure you discuss any questions you have with your health care provider. Document  Released: 02/15/2005 Document Revised: 07/24/2015 Document Reviewed: 10/23/2012 Elsevier Interactive Patient Education  2017 [employer]  

## 2016-05-26 NOTE — Patient Instructions (Signed)
Dehydration, Adult Dehydration is a condition in which there is not enough fluid or water in the body. This happens when you lose more fluids than you take in. Important organs, such as the kidneys, brain, and heart, cannot function without a proper amount of fluids. Any loss of fluids from the body can lead to dehydration. Dehydration can range from mild to severe. This condition should be treated right away to prevent it from becoming severe. What are the causes? This condition may be caused by:  Vomiting.  Diarrhea.  Excessive sweating, such as from heat exposure or exercise.  Not drinking enough fluid, especially:  When ill.  While doing activity that requires a lot of energy.  Excessive urination.  Fever.  Infection.  Certain medicines, such as medicines that cause the body to lose excess fluid (diuretics).  Inability to access safe drinking water.  Reduced physical ability to get adequate water and food. What increases the risk? This condition is more likely to develop in people:  Who have a poorly controlled long-term (chronic) illness, such as diabetes, heart disease, or kidney disease.  Who are age 65 or older.  Who are disabled.  Who live in a place with high altitude.  Who play endurance sports. What are the signs or symptoms? Symptoms of mild dehydration may include:   Thirst.  Dry lips.  Slightly dry mouth.  Dry, warm skin.  Dizziness. Symptoms of moderate dehydration may include:   Very dry mouth.  Muscle cramps.  Dark urine. Urine may be the color of tea.  Decreased urine production.  Decreased tear production.  Heartbeat that is irregular or faster than normal (palpitations).  Headache.  Light-headedness, especially when you stand up from a sitting position.  Fainting (syncope). Symptoms of severe dehydration may include:   Changes in skin, such as:  Cold and clammy skin.  Blotchy (mottled) or pale skin.  Skin that does  not quickly return to normal after being lightly pinched and released (poor skin turgor).  Changes in body fluids, such as:  Extreme thirst.  No tear production.  Inability to sweat when body temperature is high, such as in hot weather.  Very little urine production.  Changes in vital signs, such as:  Weak pulse.  Pulse that is more than 100 beats a minute when sitting still.  Rapid breathing.  Low blood pressure.  Other changes, such as:  Sunken eyes.  Cold hands and feet.  Confusion.  Lack of energy (lethargy).  Difficulty waking up from sleep.  Short-term weight loss.  Unconsciousness. How is this diagnosed? This condition is diagnosed based on your symptoms and a physical exam. Blood and urine tests may be done to help confirm the diagnosis. How is this treated? Treatment for this condition depends on the severity. Mild or moderate dehydration can often be treated at home. Treatment should be started right away. Do not wait until dehydration becomes severe. Severe dehydration is an emergency and it needs to be treated in a hospital. Treatment for mild dehydration may include:   Drinking more fluids.  Replacing salts and minerals in your blood (electrolytes) that you may have lost. Treatment for moderate dehydration may include:   Drinking an oral rehydration solution (ORS). This is a drink that helps you replace fluids and electrolytes (rehydrate). It can be found at pharmacies and retail stores. Treatment for severe dehydration may include:   Receiving fluids through an IV tube.  Receiving an electrolyte solution through a feeding tube that is   passed through your nose and into your stomach (nasogastric tube, or NG tube).  Correcting any abnormalities in electrolytes.  Treating the underlying cause of dehydration. Follow these instructions at home:  If directed by your health care provider, drink an ORS:  Make an ORS by following instructions on the  package.  Start by drinking small amounts, about  cup (120 mL) every 5-10 minutes.  Slowly increase how much you drink until you have taken the amount recommended by your health care provider.  Drink enough clear fluid to keep your urine clear or pale yellow. If you were told to drink an ORS, finish the ORS first, then start slowly drinking other clear fluids. Drink fluids such as:  Water. Do not drink only water. Doing that can lead to having too little salt (sodium) in the body (hyponatremia).  Ice chips.  Fruit juice that you have added water to (diluted fruit juice).  Low-calorie sports drinks.  Avoid:  Alcohol.  Drinks that contain a lot of sugar. These include high-calorie sports drinks, fruit juice that is not diluted, and soda.  Caffeine.  Foods that are greasy or contain a lot of fat or sugar.  Take over-the-counter and prescription medicines only as told by your health care provider.  Do not take sodium tablets. This can lead to having too much sodium in the body (hypernatremia).  Eat foods that contain a healthy balance of electrolytes, such as bananas, oranges, potatoes, tomatoes, and spinach.  Keep all follow-up visits as told by your health care provider. This is important. Contact a health care provider if:  You have abdominal pain that:  Gets worse.  Stays in one area (localizes).  You have a rash.  You have a stiff neck.  You are more irritable than usual.  You are sleepier or more difficult to wake up than usual.  You feel weak or dizzy.  You feel very thirsty.  You have urinated only a small amount of very dark urine over 6-8 hours. Get help right away if:  You have symptoms of severe dehydration.  You cannot drink fluids without vomiting.  Your symptoms get worse with treatment.  You have a fever.  You have a severe headache.  You have vomiting or diarrhea that:  Gets worse.  Does not go away.  You have blood or green matter  (bile) in your vomit.  You have blood in your stool. This may cause stool to look black and tarry.  You have not urinated in 6-8 hours.  You faint.  Your heart rate while sitting still is over 100 beats a minute.  You have trouble breathing. This information is not intended to replace advice given to you by your health care provider. Make sure you discuss any questions you have with your health care provider. Document Released: 02/15/2005 Document Revised: 09/12/2015 Document Reviewed: 04/11/2015 Elsevier Interactive Patient Education  2017 [employer]  

## 2016-05-26 NOTE — Assessment & Plan Note (Signed)
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

## 2016-05-27 ENCOUNTER — Ambulatory Visit
Admission: RE | Admit: 2016-05-27 | Discharge: 2016-05-27 | Disposition: A | Discharge status: Home / Self Care | Payer: Medicare Other | Source: Ambulatory Visit | Attending: Radiation Oncology | Admitting: Radiation Oncology

## 2016-05-27 ENCOUNTER — Ambulatory Visit (HOSPITAL_BASED_OUTPATIENT_CLINIC_OR_DEPARTMENT_OTHER): Payer: Medicare Other | Admitting: Nurse Practitioner

## 2016-05-27 VITALS — BP 122/61 | HR 114 | Temp 98.4°F | Resp 19 | Ht 67.0 in | Wt 132.8 lb

## 2016-05-27 DIAGNOSIS — C7951 Secondary malignant neoplasm of bone: Secondary | ICD-10-CM | POA: Diagnosis not present

## 2016-05-27 DIAGNOSIS — R918 Other nonspecific abnormal finding of lung field: Secondary | ICD-10-CM | POA: Diagnosis not present

## 2016-05-27 DIAGNOSIS — C561 Malignant neoplasm of right ovary: Secondary | ICD-10-CM | POA: Diagnosis not present

## 2016-05-27 DIAGNOSIS — R634 Abnormal weight loss: Secondary | ICD-10-CM | POA: Diagnosis not present

## 2016-05-27 DIAGNOSIS — C7800 Secondary malignant neoplasm of unspecified lung: Secondary | ICD-10-CM

## 2016-05-27 DIAGNOSIS — C801 Malignant (primary) neoplasm, unspecified: Secondary | ICD-10-CM

## 2016-05-27 DIAGNOSIS — Z95828 Presence of other vascular implants and grafts: Secondary | ICD-10-CM

## 2016-05-27 DIAGNOSIS — Z510 Encounter for antineoplastic radiation therapy: Secondary | ICD-10-CM | POA: Diagnosis not present

## 2016-05-27 DIAGNOSIS — C786 Secondary malignant neoplasm of retroperitoneum and peritoneum: Secondary | ICD-10-CM | POA: Diagnosis not present

## 2016-05-27 LAB — URINE CULTURE

## 2016-05-27 MED ORDER — ONDANSETRON HCL 4 MG/2ML IJ SOLN
8.0000 mg | Freq: Once | INTRAMUSCULAR | Status: AC
  Administered 2016-05-27: 8 mg via INTRAVENOUS

## 2016-05-27 MED ORDER — HEPARIN SOD (PORK) LOCK FLUSH 100 UNIT/ML IV SOLN
500.0000 [IU] | Freq: Once | INTRAVENOUS | Status: AC | PRN
  Administered 2016-05-27: 500 [IU]
  Filled 2016-05-27: qty 5

## 2016-05-27 MED ORDER — SODIUM CHLORIDE 0.9% FLUSH
10.0000 mL | INTRAVENOUS | Status: DC | PRN
  Administered 2016-05-27: 10 mL
  Filled 2016-05-27: qty 10

## 2016-05-27 MED ORDER — ALTEPLASE 2 MG IJ SOLR
2.0000 mg | Freq: Once | INTRAMUSCULAR | Status: DC | PRN
  Filled 2016-05-27: qty 2

## 2016-05-27 MED ORDER — ONDANSETRON HCL 4 MG/2ML IJ SOLN
INTRAMUSCULAR | Status: AC
  Filled 2016-05-27: qty 4

## 2016-05-27 MED ORDER — SODIUM CHLORIDE 0.9 % IV SOLN
Freq: Once | INTRAVENOUS | Status: AC
  Administered 2016-05-27: 09:00:00 via INTRAVENOUS

## 2016-05-27 MED ORDER — SODIUM CHLORIDE 0.9 % IV SOLN: Freq: Once | INTRAVENOUS | Status: DC

## 2016-05-27 MED ORDER — HEPARIN SOD (PORK) LOCK FLUSH 100 UNIT/ML IV SOLN
250.0000 [IU] | Freq: Once | INTRAVENOUS | Status: DC | PRN
  Filled 2016-05-27: qty 5

## 2016-05-27 NOTE — Patient Instructions (Signed)
Dehydration, Adult Dehydration is a condition in which there is not enough fluid or water in the body. This happens when you lose more fluids than you take in. Important organs, such as the kidneys, brain, and heart, cannot function without a proper amount of fluids. Any loss of fluids from the body can lead to dehydration. Dehydration can range from mild to severe. This condition should be treated right away to prevent it from becoming severe. What are the causes? This condition may be caused by:  Vomiting.  Diarrhea.  Excessive sweating, such as from heat exposure or exercise.  Not drinking enough fluid, especially:  When ill.  While doing activity that requires a lot of energy.  Excessive urination.  Fever.  Infection.  Certain medicines, such as medicines that cause the body to lose excess fluid (diuretics).  Inability to access safe drinking water.  Reduced physical ability to get adequate water and food. What increases the risk? This condition is more likely to develop in people:  Who have a poorly controlled long-term (chronic) illness, such as diabetes, heart disease, or kidney disease.  Who are age 65 or older.  Who are disabled.  Who live in a place with high altitude.  Who play endurance sports. What are the signs or symptoms? Symptoms of mild dehydration may include:   Thirst.  Dry lips.  Slightly dry mouth.  Dry, warm skin.  Dizziness. Symptoms of moderate dehydration may include:   Very dry mouth.  Muscle cramps.  Dark urine. Urine may be the color of tea.  Decreased urine production.  Decreased tear production.  Heartbeat that is irregular or faster than normal (palpitations).  Headache.  Light-headedness, especially when you stand up from a sitting position.  Fainting (syncope). Symptoms of severe dehydration may include:   Changes in skin, such as:  Cold and clammy skin.  Blotchy (mottled) or pale skin.  Skin that does  not quickly return to normal after being lightly pinched and released (poor skin turgor).  Changes in body fluids, such as:  Extreme thirst.  No tear production.  Inability to sweat when body temperature is high, such as in hot weather.  Very little urine production.  Changes in vital signs, such as:  Weak pulse.  Pulse that is more than 100 beats a minute when sitting still.  Rapid breathing.  Low blood pressure.  Other changes, such as:  Sunken eyes.  Cold hands and feet.  Confusion.  Lack of energy (lethargy).  Difficulty waking up from sleep.  Short-term weight loss.  Unconsciousness. How is this diagnosed? This condition is diagnosed based on your symptoms and a physical exam. Blood and urine tests may be done to help confirm the diagnosis. How is this treated? Treatment for this condition depends on the severity. Mild or moderate dehydration can often be treated at home. Treatment should be started right away. Do not wait until dehydration becomes severe. Severe dehydration is an emergency and it needs to be treated in a hospital. Treatment for mild dehydration may include:   Drinking more fluids.  Replacing salts and minerals in your blood (electrolytes) that you may have lost. Treatment for moderate dehydration may include:   Drinking an oral rehydration solution (ORS). This is a drink that helps you replace fluids and electrolytes (rehydrate). It can be found at pharmacies and retail stores. Treatment for severe dehydration may include:   Receiving fluids through an IV tube.  Receiving an electrolyte solution through a feeding tube that is   passed through your nose and into your stomach (nasogastric tube, or NG tube).  Correcting any abnormalities in electrolytes.  Treating the underlying cause of dehydration. Follow these instructions at home:  If directed by your health care provider, drink an ORS:  Make an ORS by following instructions on the  package.  Start by drinking small amounts, about  cup (120 mL) every 5-10 minutes.  Slowly increase how much you drink until you have taken the amount recommended by your health care provider.  Drink enough clear fluid to keep your urine clear or pale yellow. If you were told to drink an ORS, finish the ORS first, then start slowly drinking other clear fluids. Drink fluids such as:  Water. Do not drink only water. Doing that can lead to having too little salt (sodium) in the body (hyponatremia).  Ice chips.  Fruit juice that you have added water to (diluted fruit juice).  Low-calorie sports drinks.  Avoid:  Alcohol.  Drinks that contain a lot of sugar. These include high-calorie sports drinks, fruit juice that is not diluted, and soda.  Caffeine.  Foods that are greasy or contain a lot of fat or sugar.  Take over-the-counter and prescription medicines only as told by your health care provider.  Do not take sodium tablets. This can lead to having too much sodium in the body (hypernatremia).  Eat foods that contain a healthy balance of electrolytes, such as bananas, oranges, potatoes, tomatoes, and spinach.  Keep all follow-up visits as told by your health care provider. This is important. Contact a health care provider if:  You have abdominal pain that:  Gets worse.  Stays in one area (localizes).  You have a rash.  You have a stiff neck.  You are more irritable than usual.  You are sleepier or more difficult to wake up than usual.  You feel weak or dizzy.  You feel very thirsty.  You have urinated only a small amount of very dark urine over 6-8 hours. Get help right away if:  You have symptoms of severe dehydration.  You cannot drink fluids without vomiting.  Your symptoms get worse with treatment.  You have a fever.  You have a severe headache.  You have vomiting or diarrhea that:  Gets worse.  Does not go away.  You have blood or green matter  (bile) in your vomit.  You have blood in your stool. This may cause stool to look black and tarry.  You have not urinated in 6-8 hours.  You faint.  Your heart rate while sitting still is over 100 beats a minute.  You have trouble breathing. This information is not intended to replace advice given to you by your health care provider. Make sure you discuss any questions you have with your health care provider. Document Released: 02/15/2005 Document Revised: 09/12/2015 Document Reviewed: 04/11/2015 Elsevier Interactive Patient Education  2017 [employer]  

## 2016-05-27 NOTE — Progress Notes (Signed)
RN visit for IV fluids. 

## 2016-05-28 ENCOUNTER — Ambulatory Visit (HOSPITAL_BASED_OUTPATIENT_CLINIC_OR_DEPARTMENT_OTHER): Payer: Medicare Other

## 2016-05-28 ENCOUNTER — Ambulatory Visit
Admission: RE | Admit: 2016-05-28 | Discharge: 2016-05-28 | Disposition: A | Discharge status: Home / Self Care | Payer: Medicare Other | Source: Ambulatory Visit | Attending: Radiation Oncology | Admitting: Radiation Oncology

## 2016-05-28 ENCOUNTER — Ambulatory Visit: Payer: Medicare Other

## 2016-05-28 VITALS — BP 115/65 | HR 97 | Temp 97.7°F | Resp 18

## 2016-05-28 DIAGNOSIS — R918 Other nonspecific abnormal finding of lung field: Secondary | ICD-10-CM | POA: Diagnosis not present

## 2016-05-28 DIAGNOSIS — C7951 Secondary malignant neoplasm of bone: Secondary | ICD-10-CM

## 2016-05-28 DIAGNOSIS — Z95828 Presence of other vascular implants and grafts: Secondary | ICD-10-CM

## 2016-05-28 DIAGNOSIS — R634 Abnormal weight loss: Secondary | ICD-10-CM

## 2016-05-28 DIAGNOSIS — C786 Secondary malignant neoplasm of retroperitoneum and peritoneum: Secondary | ICD-10-CM | POA: Diagnosis not present

## 2016-05-28 DIAGNOSIS — Z510 Encounter for antineoplastic radiation therapy: Secondary | ICD-10-CM | POA: Diagnosis not present

## 2016-05-28 DIAGNOSIS — C7800 Secondary malignant neoplasm of unspecified lung: Secondary | ICD-10-CM

## 2016-05-28 DIAGNOSIS — C561 Malignant neoplasm of right ovary: Secondary | ICD-10-CM | POA: Diagnosis not present

## 2016-05-28 DIAGNOSIS — C801 Malignant (primary) neoplasm, unspecified: Secondary | ICD-10-CM

## 2016-05-28 MED ORDER — HEPARIN SOD (PORK) LOCK FLUSH 100 UNIT/ML IV SOLN
500.0000 [IU] | Freq: Once | INTRAVENOUS | Status: AC | PRN
  Administered 2016-05-28: 500 [IU]
  Filled 2016-05-28: qty 5

## 2016-05-28 MED ORDER — SODIUM CHLORIDE 0.9 % IV SOLN
Freq: Once | INTRAVENOUS | Status: AC
  Administered 2016-05-28: 13:00:00 via INTRAVENOUS

## 2016-05-28 MED ORDER — SODIUM CHLORIDE 0.9% FLUSH
10.0000 mL | INTRAVENOUS | Status: DC | PRN
  Administered 2016-05-28: 10 mL
  Filled 2016-05-28: qty 10

## 2016-05-28 NOTE — Patient Instructions (Signed)
Dehydration, Adult Dehydration is a condition in which there is not enough fluid or water in the body. This happens when you lose more fluids than you take in. Important organs, such as the kidneys, brain, and heart, cannot function without a proper amount of fluids. Any loss of fluids from the body can lead to dehydration. Dehydration can range from mild to severe. This condition should be treated right away to prevent it from becoming severe. What are the causes? This condition may be caused by:  Vomiting.  Diarrhea.  Excessive sweating, such as from heat exposure or exercise.  Not drinking enough fluid, especially:  When ill.  While doing activity that requires a lot of energy.  Excessive urination.  Fever.  Infection.  Certain medicines, such as medicines that cause the body to lose excess fluid (diuretics).  Inability to access safe drinking water.  Reduced physical ability to get adequate water and food. What increases the risk? This condition is more likely to develop in people:  Who have a poorly controlled long-term (chronic) illness, such as diabetes, heart disease, or kidney disease.  Who are age 65 or older.  Who are disabled.  Who live in a place with high altitude.  Who play endurance sports. What are the signs or symptoms? Symptoms of mild dehydration may include:   Thirst.  Dry lips.  Slightly dry mouth.  Dry, warm skin.  Dizziness. Symptoms of moderate dehydration may include:   Very dry mouth.  Muscle cramps.  Dark urine. Urine may be the color of tea.  Decreased urine production.  Decreased tear production.  Heartbeat that is irregular or faster than normal (palpitations).  Headache.  Light-headedness, especially when you stand up from a sitting position.  Fainting (syncope). Symptoms of severe dehydration may include:   Changes in skin, such as:  Cold and clammy skin.  Blotchy (mottled) or pale skin.  Skin that does  not quickly return to normal after being lightly pinched and released (poor skin turgor).  Changes in body fluids, such as:  Extreme thirst.  No tear production.  Inability to sweat when body temperature is high, such as in hot weather.  Very little urine production.  Changes in vital signs, such as:  Weak pulse.  Pulse that is more than 100 beats a minute when sitting still.  Rapid breathing.  Low blood pressure.  Other changes, such as:  Sunken eyes.  Cold hands and feet.  Confusion.  Lack of energy (lethargy).  Difficulty waking up from sleep.  Short-term weight loss.  Unconsciousness. How is this diagnosed? This condition is diagnosed based on your symptoms and a physical exam. Blood and urine tests may be done to help confirm the diagnosis. How is this treated? Treatment for this condition depends on the severity. Mild or moderate dehydration can often be treated at home. Treatment should be started right away. Do not wait until dehydration becomes severe. Severe dehydration is an emergency and it needs to be treated in a hospital. Treatment for mild dehydration may include:   Drinking more fluids.  Replacing salts and minerals in your blood (electrolytes) that you may have lost. Treatment for moderate dehydration may include:   Drinking an oral rehydration solution (ORS). This is a drink that helps you replace fluids and electrolytes (rehydrate). It can be found at pharmacies and retail stores. Treatment for severe dehydration may include:   Receiving fluids through an IV tube.  Receiving an electrolyte solution through a feeding tube that is   passed through your nose and into your stomach (nasogastric tube, or NG tube).  Correcting any abnormalities in electrolytes.  Treating the underlying cause of dehydration. Follow these instructions at home:  If directed by your health care provider, drink an ORS:  Make an ORS by following instructions on the  package.  Start by drinking small amounts, about  cup (120 mL) every 5-10 minutes.  Slowly increase how much you drink until you have taken the amount recommended by your health care provider.  Drink enough clear fluid to keep your urine clear or pale yellow. If you were told to drink an ORS, finish the ORS first, then start slowly drinking other clear fluids. Drink fluids such as:  Water. Do not drink only water. Doing that can lead to having too little salt (sodium) in the body (hyponatremia).  Ice chips.  Fruit juice that you have added water to (diluted fruit juice).  Low-calorie sports drinks.  Avoid:  Alcohol.  Drinks that contain a lot of sugar. These include high-calorie sports drinks, fruit juice that is not diluted, and soda.  Caffeine.  Foods that are greasy or contain a lot of fat or sugar.  Take over-the-counter and prescription medicines only as told by your health care provider.  Do not take sodium tablets. This can lead to having too much sodium in the body (hypernatremia).  Eat foods that contain a healthy balance of electrolytes, such as bananas, oranges, potatoes, tomatoes, and spinach.  Keep all follow-up visits as told by your health care provider. This is important. Contact a health care provider if:  You have abdominal pain that:  Gets worse.  Stays in one area (localizes).  You have a rash.  You have a stiff neck.  You are more irritable than usual.  You are sleepier or more difficult to wake up than usual.  You feel weak or dizzy.  You feel very thirsty.  You have urinated only a small amount of very dark urine over 6-8 hours. Get help right away if:  You have symptoms of severe dehydration.  You cannot drink fluids without vomiting.  Your symptoms get worse with treatment.  You have a fever.  You have a severe headache.  You have vomiting or diarrhea that:  Gets worse.  Does not go away.  You have blood or green matter  (bile) in your vomit.  You have blood in your stool. This may cause stool to look black and tarry.  You have not urinated in 6-8 hours.  You faint.  Your heart rate while sitting still is over 100 beats a minute.  You have trouble breathing. This information is not intended to replace advice given to you by your health care provider. Make sure you discuss any questions you have with your health care provider. Document Released: 02/15/2005 Document Revised: 09/12/2015 Document Reviewed: 04/11/2015 Elsevier Interactive Patient Education  2017 [employer]  

## 2016-05-29 ENCOUNTER — Ambulatory Visit (HOSPITAL_BASED_OUTPATIENT_CLINIC_OR_DEPARTMENT_OTHER): Payer: Medicare Other

## 2016-05-29 VITALS — BP 135/69 | HR 95 | Temp 98.7°F | Resp 18

## 2016-05-29 DIAGNOSIS — C786 Secondary malignant neoplasm of retroperitoneum and peritoneum: Secondary | ICD-10-CM

## 2016-05-29 DIAGNOSIS — C7951 Secondary malignant neoplasm of bone: Secondary | ICD-10-CM

## 2016-05-29 DIAGNOSIS — C561 Malignant neoplasm of right ovary: Secondary | ICD-10-CM

## 2016-05-29 DIAGNOSIS — Z95828 Presence of other vascular implants and grafts: Secondary | ICD-10-CM

## 2016-05-29 DIAGNOSIS — C801 Malignant (primary) neoplasm, unspecified: Secondary | ICD-10-CM

## 2016-05-29 MED ORDER — SODIUM CHLORIDE 0.9% FLUSH
10.0000 mL | INTRAVENOUS | Status: DC | PRN
  Administered 2016-05-29: 10 mL
  Filled 2016-05-29: qty 10

## 2016-05-29 MED ORDER — SODIUM CHLORIDE 0.9 % IV SOLN
Freq: Once | INTRAVENOUS | Status: AC
  Administered 2016-05-29: 10:00:00 via INTRAVENOUS

## 2016-05-29 MED ORDER — HEPARIN SOD (PORK) LOCK FLUSH 100 UNIT/ML IV SOLN
500.0000 [IU] | Freq: Once | INTRAVENOUS | Status: AC | PRN
  Administered 2016-05-29: 500 [IU]
  Filled 2016-05-29: qty 5

## 2016-05-31 ENCOUNTER — Ambulatory Visit
Admission: RE | Admit: 2016-05-31 | Discharge: 2016-05-31 | Disposition: A | Discharge status: Home / Self Care | Payer: Medicare Other | Source: Ambulatory Visit | Attending: Radiation Oncology | Admitting: Radiation Oncology

## 2016-05-31 ENCOUNTER — Ambulatory Visit (HOSPITAL_BASED_OUTPATIENT_CLINIC_OR_DEPARTMENT_OTHER): Payer: Medicare Other

## 2016-05-31 VITALS — BP 118/67 | HR 113 | Temp 98.0°F | Resp 17

## 2016-05-31 DIAGNOSIS — C7800 Secondary malignant neoplasm of unspecified lung: Secondary | ICD-10-CM | POA: Diagnosis not present

## 2016-05-31 DIAGNOSIS — R634 Abnormal weight loss: Secondary | ICD-10-CM

## 2016-05-31 DIAGNOSIS — C801 Malignant (primary) neoplasm, unspecified: Secondary | ICD-10-CM

## 2016-05-31 DIAGNOSIS — R630 Anorexia: Secondary | ICD-10-CM

## 2016-05-31 DIAGNOSIS — C7951 Secondary malignant neoplasm of bone: Secondary | ICD-10-CM

## 2016-05-31 DIAGNOSIS — R918 Other nonspecific abnormal finding of lung field: Secondary | ICD-10-CM | POA: Diagnosis not present

## 2016-05-31 DIAGNOSIS — C786 Secondary malignant neoplasm of retroperitoneum and peritoneum: Secondary | ICD-10-CM | POA: Diagnosis not present

## 2016-05-31 DIAGNOSIS — C561 Malignant neoplasm of right ovary: Secondary | ICD-10-CM

## 2016-05-31 DIAGNOSIS — Z95828 Presence of other vascular implants and grafts: Secondary | ICD-10-CM

## 2016-05-31 DIAGNOSIS — Z510 Encounter for antineoplastic radiation therapy: Secondary | ICD-10-CM | POA: Diagnosis not present

## 2016-05-31 MED ORDER — SODIUM CHLORIDE 0.9 % IV SOLN
Freq: Once | INTRAVENOUS | Status: AC
  Administered 2016-05-31: 09:00:00 via INTRAVENOUS

## 2016-05-31 MED ORDER — HEPARIN SOD (PORK) LOCK FLUSH 100 UNIT/ML IV SOLN
500.0000 [IU] | Freq: Once | INTRAVENOUS | Status: DC | PRN
  Filled 2016-05-31: qty 5

## 2016-05-31 MED ORDER — SODIUM CHLORIDE 0.9% FLUSH
10.0000 mL | Freq: Once | INTRAVENOUS | Status: AC
  Administered 2016-05-31: 10 mL
  Filled 2016-05-31: qty 10

## 2016-05-31 MED ORDER — ALTEPLASE 2 MG IJ SOLR
2.0000 mg | Freq: Once | INTRAMUSCULAR | Status: DC | PRN
  Filled 2016-05-31: qty 2

## 2016-05-31 MED ORDER — HEPARIN SOD (PORK) LOCK FLUSH 100 UNIT/ML IV SOLN
250.0000 [IU] | Freq: Once | INTRAVENOUS | Status: DC | PRN
  Filled 2016-05-31: qty 5

## 2016-05-31 MED ORDER — HEPARIN SOD (PORK) LOCK FLUSH 100 UNIT/ML IV SOLN
500.0000 [IU] | Freq: Once | INTRAVENOUS | Status: AC
  Administered 2016-05-31: 500 [IU]
  Filled 2016-05-31: qty 5

## 2016-05-31 MED ORDER — SODIUM CHLORIDE 0.9% FLUSH
10.0000 mL | INTRAVENOUS | Status: DC | PRN
  Filled 2016-05-31: qty 10

## 2016-06-01 ENCOUNTER — Ambulatory Visit (HOSPITAL_BASED_OUTPATIENT_CLINIC_OR_DEPARTMENT_OTHER): Payer: Medicare Other

## 2016-06-01 ENCOUNTER — Other Ambulatory Visit (HOSPITAL_BASED_OUTPATIENT_CLINIC_OR_DEPARTMENT_OTHER): Payer: Medicare Other

## 2016-06-01 ENCOUNTER — Ambulatory Visit
Admission: RE | Admit: 2016-06-01 | Discharge: 2016-06-01 | Disposition: A | Discharge status: Home / Self Care | Payer: Medicare Other | Source: Ambulatory Visit | Attending: Radiation Oncology | Admitting: Radiation Oncology

## 2016-06-01 VITALS — BP 126/80 | HR 103 | Temp 97.9°F | Resp 18 | Wt 134.2 lb

## 2016-06-01 VITALS — BP 119/65 | HR 102 | Temp 98.7°F | Resp 16

## 2016-06-01 DIAGNOSIS — C561 Malignant neoplasm of right ovary: Secondary | ICD-10-CM | POA: Diagnosis not present

## 2016-06-01 DIAGNOSIS — N390 Urinary tract infection, site not specified: Secondary | ICD-10-CM | POA: Diagnosis not present

## 2016-06-01 DIAGNOSIS — T451X5A Adverse effect of antineoplastic and immunosuppressive drugs, initial encounter: Secondary | ICD-10-CM

## 2016-06-01 DIAGNOSIS — C7800 Secondary malignant neoplasm of unspecified lung: Secondary | ICD-10-CM

## 2016-06-01 DIAGNOSIS — C569 Malignant neoplasm of unspecified ovary: Secondary | ICD-10-CM

## 2016-06-01 DIAGNOSIS — C786 Secondary malignant neoplasm of retroperitoneum and peritoneum: Secondary | ICD-10-CM | POA: Diagnosis not present

## 2016-06-01 DIAGNOSIS — C7951 Secondary malignant neoplasm of bone: Secondary | ICD-10-CM

## 2016-06-01 DIAGNOSIS — R918 Other nonspecific abnormal finding of lung field: Secondary | ICD-10-CM | POA: Diagnosis not present

## 2016-06-01 DIAGNOSIS — Z510 Encounter for antineoplastic radiation therapy: Secondary | ICD-10-CM | POA: Diagnosis not present

## 2016-06-01 DIAGNOSIS — C801 Malignant (primary) neoplasm, unspecified: Secondary | ICD-10-CM

## 2016-06-01 DIAGNOSIS — C482 Malignant neoplasm of peritoneum, unspecified: Secondary | ICD-10-CM

## 2016-06-01 DIAGNOSIS — D6481 Anemia due to antineoplastic chemotherapy: Secondary | ICD-10-CM

## 2016-06-01 DIAGNOSIS — Z95828 Presence of other vascular implants and grafts: Secondary | ICD-10-CM

## 2016-06-01 LAB — COMPREHENSIVE METABOLIC PANEL
ALBUMIN: 2.9 g/dL — AB (ref 3.5–5.0)
ALT: 21 U/L (ref 0–55)
ANION GAP: 11 meq/L (ref 3–11)
AST: 24 U/L (ref 5–34)
Alkaline Phosphatase: 77 U/L (ref 40–150)
BILIRUBIN TOTAL: 0.49 mg/dL (ref 0.20–1.20)
BUN: 17 mg/dL (ref 7.0–26.0)
CALCIUM: 10.4 mg/dL (ref 8.4–10.4)
CHLORIDE: 99 meq/L (ref 98–109)
CO2: 26 mEq/L (ref 22–29)
CREATININE: 0.8 mg/dL (ref 0.6–1.1)
EGFR: 81 mL/min/{1.73_m2} — ABNORMAL LOW (ref >90)
Glucose: 263 mg/dl — ABNORMAL HIGH (ref 70–140)
Potassium: 4 mEq/L (ref 3.5–5.1)
Sodium: 137 mEq/L (ref 136–145)
Total Protein: 7 g/dL (ref 6.4–8.3)

## 2016-06-01 LAB — URINALYSIS, MICROSCOPIC - CHCC
BILIRUBIN (URINE): NEGATIVE
BLOOD: NEGATIVE
Glucose: NEGATIVE mg/dL
Ketones: NEGATIVE mg/dL
NITRITE: NEGATIVE
Protein: <30 mg/dL
SPECIFIC GRAVITY, URINE: 1.01 (ref 1.003–1.035)
Urobilinogen, UR: 0.2 mg/dL (ref 0.2–1)
pH: 6.5 (ref 4.6–8.0)

## 2016-06-01 LAB — CBC WITH DIFFERENTIAL/PLATELET
BASO%: 0.2 % (ref 0.0–2.0)
Basophils Absolute: 0 10*3/uL (ref 0.0–0.1)
EOS%: 1 % (ref 0.0–7.0)
Eosinophils Absolute: 0.1 10*3/uL (ref 0.0–0.5)
HEMATOCRIT: 28 % — AB (ref 34.8–46.6)
HEMOGLOBIN: 9.2 g/dL — AB (ref 11.6–15.9)
LYMPH#: 1.1 10*3/uL (ref 0.9–3.3)
LYMPH%: 7.6 % — ABNORMAL LOW (ref 14.0–49.7)
MCH: 25.5 pg (ref 25.1–34.0)
MCHC: 32.9 g/dL (ref 31.5–36.0)
MCV: 77.4 fL — ABNORMAL LOW (ref 79.5–101.0)
MONO#: 1.2 10*3/uL — AB (ref 0.1–0.9)
MONO%: 8.3 % (ref 0.0–14.0)
NEUT#: 12.1 10*3/uL — ABNORMAL HIGH (ref 1.5–6.5)
NEUT%: 82.9 % — AB (ref 38.4–76.8)
PLATELETS: 247 10*3/uL (ref 145–400)
RBC: 3.62 10*6/uL — ABNORMAL LOW (ref 3.70–5.45)
RDW: 15.1 % — AB (ref 11.2–14.5)
WBC: 14.6 10*3/uL — ABNORMAL HIGH (ref 3.9–10.3)

## 2016-06-01 MED ORDER — HEPARIN SOD (PORK) LOCK FLUSH 100 UNIT/ML IV SOLN
500.0000 [IU] | Freq: Once | INTRAVENOUS | Status: AC | PRN
  Administered 2016-06-01: 500 [IU]
  Filled 2016-06-01: qty 5

## 2016-06-01 MED ORDER — SUCRALFATE 1 GM/10ML PO SUSP: 1.0000 g | Freq: Three times a day (TID) | ORAL | 0 refills | Status: DC

## 2016-06-01 MED ORDER — LIDOCAINE VISCOUS 2 % MT SOLN: 10.0000 mL | Freq: Three times a day (TID) | OROMUCOSAL | 1 refills | Status: DC

## 2016-06-01 MED ORDER — SODIUM CHLORIDE 0.9% FLUSH
10.0000 mL | INTRAVENOUS | Status: DC | PRN
  Administered 2016-06-01: 10 mL
  Filled 2016-06-01: qty 10

## 2016-06-01 MED ORDER — SODIUM CHLORIDE 0.9 % IV SOLN
1000.0000 mL | Freq: Once | INTRAVENOUS | Status: AC
  Administered 2016-06-01: 1000 mL via INTRAVENOUS

## 2016-06-01 NOTE — Progress Notes (Signed)
  Radiation Oncology         ([phone]) [phone] ________________________________  Name: Leslie Rowe MRN: [id number]  Date: 06/01/2016  DOB: [birth year]  Weekly Radiation Therapy Management    ICD-9-CM ICD-10-CM   1. Metastasis to bone (HCC) 198.5 C79.51   2. 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 . . . . . . . The patient presents for routine under treatment assessment.     Torunn Chancellor completed 12 fractions of radiation to cervical spine C7. Patient reports having pain when swallowing or trying to eat foods with a thick consistency. Patient is taking Dilaudid for pain and Ativan/Zofran for nausea. She is only able to tolerate liquids or pureed food. She is supplementing with Ensure. Patient is getting IV hydration since last Tuesday in the infusion room at [facility]. The patient denies feeling dizzy when she stands up. Patient is going to start a new regimen of chemo in a couple of weeks.  Patient reports having a decline in energy but still is taking care of personal needs.                                  Set-up films were reviewed.                                 The chart was checked. Physical Findings. . .  weight is 134 lb 3.2 oz (60.9 kg). Her oral temperature is 97.9 F (36.6 C). Her blood pressure is 126/80 and her pulse is 103 (abnormal). Her respiration is 18 and oxygen saturation is 100%.   Some weight GAIN noted. Lungs are clear to auscultation bilaterally. Heart shows regular rhythm and rate. Oropharynx mucosa moist with no sign of secondary infection. Neck area shows hyperpigmentation changes. Impression . . . . . . .  She has a poor appetite and difficulty swallowing. Plan . . . . . . . . . . . . Continue treatment as planned.  I will prescribe Carafate and Xylocaine to help with her difficulty swallowing and throat  pain. ________________________________   Blair Promise, PhD, MD  This document serves as a record of services personally performed by Gery Pray, MD. It was created on his behalf by Darcus Austin, a trained medical scribe. The creation of this record is based on the scribe's personal observations and the provider's statements to them. This document has been checked and approved by the attending provider.

## 2016-06-01 NOTE — Patient Instructions (Signed)
Dehydration, Adult Dehydration is a condition in which there is not enough fluid or water in the body. This happens when you lose more fluids than you take in. Important organs, such as the kidneys, brain, and heart, cannot function without a proper amount of fluids. Any loss of fluids from the body can lead to dehydration. Dehydration can range from mild to severe. This condition should be treated right away to prevent it from becoming severe. What are the causes? This condition may be caused by:  Vomiting.  Diarrhea.  Excessive sweating, such as from heat exposure or exercise.  Not drinking enough fluid, especially:  When ill.  While doing activity that requires a lot of energy.  Excessive urination.  Fever.  Infection.  Certain medicines, such as medicines that cause the body to lose excess fluid (diuretics).  Inability to access safe drinking water.  Reduced physical ability to get adequate water and food. What increases the risk? This condition is more likely to develop in people:  Who have a poorly controlled long-term (chronic) illness, such as diabetes, heart disease, or kidney disease.  Who are age 65 or older.  Who are disabled.  Who live in a place with high altitude.  Who play endurance sports. What are the signs or symptoms? Symptoms of mild dehydration may include:   Thirst.  Dry lips.  Slightly dry mouth.  Dry, warm skin.  Dizziness. Symptoms of moderate dehydration may include:   Very dry mouth.  Muscle cramps.  Dark urine. Urine may be the color of tea.  Decreased urine production.  Decreased tear production.  Heartbeat that is irregular or faster than normal (palpitations).  Headache.  Light-headedness, especially when you stand up from a sitting position.  Fainting (syncope). Symptoms of severe dehydration may include:   Changes in skin, such as:  Cold and clammy skin.  Blotchy (mottled) or pale skin.  Skin that does  not quickly return to normal after being lightly pinched and released (poor skin turgor).  Changes in body fluids, such as:  Extreme thirst.  No tear production.  Inability to sweat when body temperature is high, such as in hot weather.  Very little urine production.  Changes in vital signs, such as:  Weak pulse.  Pulse that is more than 100 beats a minute when sitting still.  Rapid breathing.  Low blood pressure.  Other changes, such as:  Sunken eyes.  Cold hands and feet.  Confusion.  Lack of energy (lethargy).  Difficulty waking up from sleep.  Short-term weight loss.  Unconsciousness. How is this diagnosed? This condition is diagnosed based on your symptoms and a physical exam. Blood and urine tests may be done to help confirm the diagnosis. How is this treated? Treatment for this condition depends on the severity. Mild or moderate dehydration can often be treated at home. Treatment should be started right away. Do not wait until dehydration becomes severe. Severe dehydration is an emergency and it needs to be treated in a hospital. Treatment for mild dehydration may include:   Drinking more fluids.  Replacing salts and minerals in your blood (electrolytes) that you may have lost. Treatment for moderate dehydration may include:   Drinking an oral rehydration solution (ORS). This is a drink that helps you replace fluids and electrolytes (rehydrate). It can be found at pharmacies and retail stores. Treatment for severe dehydration may include:   Receiving fluids through an IV tube.  Receiving an electrolyte solution through a feeding tube that is   passed through your nose and into your stomach (nasogastric tube, or NG tube).  Correcting any abnormalities in electrolytes.  Treating the underlying cause of dehydration. Follow these instructions at home:  If directed by your health care provider, drink an ORS:  Make an ORS by following instructions on the  package.  Start by drinking small amounts, about  cup (120 mL) every 5-10 minutes.  Slowly increase how much you drink until you have taken the amount recommended by your health care provider.  Drink enough clear fluid to keep your urine clear or pale yellow. If you were told to drink an ORS, finish the ORS first, then start slowly drinking other clear fluids. Drink fluids such as:  Water. Do not drink only water. Doing that can lead to having too little salt (sodium) in the body (hyponatremia).  Ice chips.  Fruit juice that you have added water to (diluted fruit juice).  Low-calorie sports drinks.  Avoid:  Alcohol.  Drinks that contain a lot of sugar. These include high-calorie sports drinks, fruit juice that is not diluted, and soda.  Caffeine.  Foods that are greasy or contain a lot of fat or sugar.  Take over-the-counter and prescription medicines only as told by your health care provider.  Do not take sodium tablets. This can lead to having too much sodium in the body (hypernatremia).  Eat foods that contain a healthy balance of electrolytes, such as bananas, oranges, potatoes, tomatoes, and spinach.  Keep all follow-up visits as told by your health care provider. This is important. Contact a health care provider if:  You have abdominal pain that:  Gets worse.  Stays in one area (localizes).  You have a rash.  You have a stiff neck.  You are more irritable than usual.  You are sleepier or more difficult to wake up than usual.  You feel weak or dizzy.  You feel very thirsty.  You have urinated only a small amount of very dark urine over 6-8 hours. Get help right away if:  You have symptoms of severe dehydration.  You cannot drink fluids without vomiting.  Your symptoms get worse with treatment.  You have a fever.  You have a severe headache.  You have vomiting or diarrhea that:  Gets worse.  Does not go away.  You have blood or green matter  (bile) in your vomit.  You have blood in your stool. This may cause stool to look black and tarry.  You have not urinated in 6-8 hours.  You faint.  Your heart rate while sitting still is over 100 beats a minute.  You have trouble breathing. This information is not intended to replace advice given to you by your health care provider. Make sure you discuss any questions you have with your health care provider. Document Released: 02/15/2005 Document Revised: 09/12/2015 Document Reviewed: 04/11/2015 Elsevier Interactive Patient Education  2017 [employer]  

## 2016-06-01 NOTE — Progress Notes (Signed)
Leslie Rowe completed 12th fraction of radiation to cervical spine C7.  Patient reports having pain when swallowing or trying to eat foods with a thick consistency.  Patient is taking Dilaudid for pain and Ativan/Zofran for nausea.  Only able to tolerate liquids or pureed food.  Supplementing with Ensure.  Patient is getting IV hydration since last Tuesday in the infusion room at [facility].  Patient is going to start a new regimen of chemo in a couple of weeks.  Patient reports having a decline in energy but still is taking care of person needs.  Vitals:   06/01/16 1154  BP: 126/80  Pulse: (!) 103  Resp: 18  Temp: 97.9 F (36.6 C)  TempSrc: Oral  SpO2: 100%  Weight: 134 lb 3.2 oz (60.9 kg)   Wt Readings from Last 3 Encounters:  06/01/16 134 lb 3.2 oz (60.9 kg)  05/27/16 132 lb 12.8 oz (60.2 kg)  05/26/16 129 lb 3.2 oz (58.6 kg)

## 2016-06-02 ENCOUNTER — Telehealth: Payer: Self-pay | Admitting: *Deleted

## 2016-06-02 ENCOUNTER — Encounter: Payer: Self-pay | Admitting: Hematology and Oncology

## 2016-06-02 ENCOUNTER — Ambulatory Visit
Admission: RE | Admit: 2016-06-02 | Discharge: 2016-06-02 | Disposition: A | Discharge status: Home / Self Care | Payer: Medicare Other | Source: Ambulatory Visit | Attending: Radiation Oncology | Admitting: Radiation Oncology

## 2016-06-02 ENCOUNTER — Telehealth: Payer: Self-pay | Admitting: Hematology and Oncology

## 2016-06-02 ENCOUNTER — Ambulatory Visit (HOSPITAL_BASED_OUTPATIENT_CLINIC_OR_DEPARTMENT_OTHER): Payer: Medicare Other | Admitting: Hematology and Oncology

## 2016-06-02 ENCOUNTER — Ambulatory Visit (HOSPITAL_BASED_OUTPATIENT_CLINIC_OR_DEPARTMENT_OTHER): Payer: Medicare Other

## 2016-06-02 VITALS — BP 138/66 | HR 99 | Temp 99.4°F | Resp 16

## 2016-06-02 DIAGNOSIS — C801 Malignant (primary) neoplasm, unspecified: Secondary | ICD-10-CM

## 2016-06-02 DIAGNOSIS — C7800 Secondary malignant neoplasm of unspecified lung: Secondary | ICD-10-CM

## 2016-06-02 DIAGNOSIS — C7951 Secondary malignant neoplasm of bone: Secondary | ICD-10-CM

## 2016-06-02 DIAGNOSIS — G893 Neoplasm related pain (acute) (chronic): Secondary | ICD-10-CM | POA: Diagnosis not present

## 2016-06-02 DIAGNOSIS — Z794 Long term (current) use of insulin: Secondary | ICD-10-CM

## 2016-06-02 DIAGNOSIS — R64 Cachexia: Secondary | ICD-10-CM

## 2016-06-02 DIAGNOSIS — C561 Malignant neoplasm of right ovary: Secondary | ICD-10-CM

## 2016-06-02 DIAGNOSIS — D6481 Anemia due to antineoplastic chemotherapy: Secondary | ICD-10-CM | POA: Diagnosis not present

## 2016-06-02 DIAGNOSIS — R918 Other nonspecific abnormal finding of lung field: Secondary | ICD-10-CM | POA: Diagnosis not present

## 2016-06-02 DIAGNOSIS — T451X5A Adverse effect of antineoplastic and immunosuppressive drugs, initial encounter: Secondary | ICD-10-CM

## 2016-06-02 DIAGNOSIS — E119 Type 2 diabetes mellitus without complications: Secondary | ICD-10-CM | POA: Diagnosis not present

## 2016-06-02 DIAGNOSIS — Z510 Encounter for antineoplastic radiation therapy: Secondary | ICD-10-CM | POA: Diagnosis not present

## 2016-06-02 DIAGNOSIS — C786 Secondary malignant neoplasm of retroperitoneum and peritoneum: Secondary | ICD-10-CM

## 2016-06-02 DIAGNOSIS — Z95828 Presence of other vascular implants and grafts: Secondary | ICD-10-CM

## 2016-06-02 LAB — CA 125: Cancer Antigen (CA) 125: 182.2 U/mL — ABNORMAL HIGH (ref 0.0–38.1)

## 2016-06-02 LAB — URINE CULTURE: Organism ID, Bacteria: NO GROWTH

## 2016-06-02 MED ORDER — SODIUM CHLORIDE 0.9 % IV SOLN
Freq: Once | INTRAVENOUS | Status: AC
  Administered 2016-06-02: 12:00:00 via INTRAVENOUS

## 2016-06-02 MED ORDER — SODIUM CHLORIDE 0.9% FLUSH
10.0000 mL | INTRAVENOUS | Status: DC | PRN
  Administered 2016-06-02: 10 mL
  Filled 2016-06-02: qty 10

## 2016-06-02 MED ORDER — HEPARIN SOD (PORK) LOCK FLUSH 100 UNIT/ML IV SOLN
500.0000 [IU] | Freq: Once | INTRAVENOUS | Status: AC | PRN
  Administered 2016-06-02: 500 [IU]
  Filled 2016-06-02: qty 5

## 2016-06-02 NOTE — Patient Instructions (Signed)
Dehydration, Adult Dehydration is a condition in which there is not enough fluid or water in the body. This happens when you lose more fluids than you take in. Important organs, such as the kidneys, brain, and heart, cannot function without a proper amount of fluids. Any loss of fluids from the body can lead to dehydration. Dehydration can range from mild to severe. This condition should be treated right away to prevent it from becoming severe. What are the causes? This condition may be caused by:  Vomiting.  Diarrhea.  Excessive sweating, such as from heat exposure or exercise.  Not drinking enough fluid, especially:  When ill.  While doing activity that requires a lot of energy.  Excessive urination.  Fever.  Infection.  Certain medicines, such as medicines that cause the body to lose excess fluid (diuretics).  Inability to access safe drinking water.  Reduced physical ability to get adequate water and food. What increases the risk? This condition is more likely to develop in people:  Who have a poorly controlled long-term (chronic) illness, such as diabetes, heart disease, or kidney disease.  Who are age 65 or older.  Who are disabled.  Who live in a place with high altitude.  Who play endurance sports. What are the signs or symptoms? Symptoms of mild dehydration may include:   Thirst.  Dry lips.  Slightly dry mouth.  Dry, warm skin.  Dizziness. Symptoms of moderate dehydration may include:   Very dry mouth.  Muscle cramps.  Dark urine. Urine may be the color of tea.  Decreased urine production.  Decreased tear production.  Heartbeat that is irregular or faster than normal (palpitations).  Headache.  Light-headedness, especially when you stand up from a sitting position.  Fainting (syncope). Symptoms of severe dehydration may include:   Changes in skin, such as:  Cold and clammy skin.  Blotchy (mottled) or pale skin.  Skin that does  not quickly return to normal after being lightly pinched and released (poor skin turgor).  Changes in body fluids, such as:  Extreme thirst.  No tear production.  Inability to sweat when body temperature is high, such as in hot weather.  Very little urine production.  Changes in vital signs, such as:  Weak pulse.  Pulse that is more than 100 beats a minute when sitting still.  Rapid breathing.  Low blood pressure.  Other changes, such as:  Sunken eyes.  Cold hands and feet.  Confusion.  Lack of energy (lethargy).  Difficulty waking up from sleep.  Short-term weight loss.  Unconsciousness. How is this diagnosed? This condition is diagnosed based on your symptoms and a physical exam. Blood and urine tests may be done to help confirm the diagnosis. How is this treated? Treatment for this condition depends on the severity. Mild or moderate dehydration can often be treated at home. Treatment should be started right away. Do not wait until dehydration becomes severe. Severe dehydration is an emergency and it needs to be treated in a hospital. Treatment for mild dehydration may include:   Drinking more fluids.  Replacing salts and minerals in your blood (electrolytes) that you may have lost. Treatment for moderate dehydration may include:   Drinking an oral rehydration solution (ORS). This is a drink that helps you replace fluids and electrolytes (rehydrate). It can be found at pharmacies and retail stores. Treatment for severe dehydration may include:   Receiving fluids through an IV tube.  Receiving an electrolyte solution through a feeding tube that is   passed through your nose and into your stomach (nasogastric tube, or NG tube).  Correcting any abnormalities in electrolytes.  Treating the underlying cause of dehydration. Follow these instructions at home:  If directed by your health care provider, drink an ORS:  Make an ORS by following instructions on the  package.  Start by drinking small amounts, about  cup (120 mL) every 5-10 minutes.  Slowly increase how much you drink until you have taken the amount recommended by your health care provider.  Drink enough clear fluid to keep your urine clear or pale yellow. If you were told to drink an ORS, finish the ORS first, then start slowly drinking other clear fluids. Drink fluids such as:  Water. Do not drink only water. Doing that can lead to having too little salt (sodium) in the body (hyponatremia).  Ice chips.  Fruit juice that you have added water to (diluted fruit juice).  Low-calorie sports drinks.  Avoid:  Alcohol.  Drinks that contain a lot of sugar. These include high-calorie sports drinks, fruit juice that is not diluted, and soda.  Caffeine.  Foods that are greasy or contain a lot of fat or sugar.  Take over-the-counter and prescription medicines only as told by your health care provider.  Do not take sodium tablets. This can lead to having too much sodium in the body (hypernatremia).  Eat foods that contain a healthy balance of electrolytes, such as bananas, oranges, potatoes, tomatoes, and spinach.  Keep all follow-up visits as told by your health care provider. This is important. Contact a health care provider if:  You have abdominal pain that:  Gets worse.  Stays in one area (localizes).  You have a rash.  You have a stiff neck.  You are more irritable than usual.  You are sleepier or more difficult to wake up than usual.  You feel weak or dizzy.  You feel very thirsty.  You have urinated only a small amount of very dark urine over 6-8 hours. Get help right away if:  You have symptoms of severe dehydration.  You cannot drink fluids without vomiting.  Your symptoms get worse with treatment.  You have a fever.  You have a severe headache.  You have vomiting or diarrhea that:  Gets worse.  Does not go away.  You have blood or green matter  (bile) in your vomit.  You have blood in your stool. This may cause stool to look black and tarry.  You have not urinated in 6-8 hours.  You faint.  Your heart rate while sitting still is over 100 beats a minute.  You have trouble breathing. This information is not intended to replace advice given to you by your health care provider. Make sure you discuss any questions you have with your health care provider. Document Released: 02/15/2005 Document Revised: 09/12/2015 Document Reviewed: 04/11/2015 Elsevier Interactive Patient Education  2017 [employer]  

## 2016-06-02 NOTE — Assessment & Plan Note (Signed)
She denies painful bone lesion.  CT demonstrated T7 involvement. She will be finishing radiation tomorrow

## 2016-06-02 NOTE — Assessment & Plan Note (Signed)
Blood sugar is a little worse I have recommended stopping prednisone therapy She will continue insulin treatment

## 2016-06-02 NOTE — Assessment & Plan Note (Signed)
The patient is very frail and is experiencing a lot of side effects from recent radiation treatment She will continue supportive IV fluids daily We discussed the guidelines We discussed some of the risks, benefits, side effects of chemotherapy and choices of treatment Ultimately, she settle with treatment with gemcitabine I recommend we start treatment 3 weeks from now to allow adequate healing from side effects from recent radiation treatment

## 2016-06-02 NOTE — Telephone Encounter (Signed)
Daughter notified of message below 

## 2016-06-02 NOTE — Telephone Encounter (Signed)
Scheduled appts per 06/02/2016 los. Gave patient AVS and calender per 06/02/2016 los.

## 2016-06-02 NOTE — Assessment & Plan Note (Signed)
There is a component of anemia of chronic disease from recent chemotheraoy. The patient denies recent history of bleeding such as epistaxis, hematuria or hematochezia. She is asymptomatic from the anemia. We will observe for now.  Recent iron studies show high ferritin. I recommend she stops taking iron supplement as it contributes to severe constipation.

## 2016-06-02 NOTE — Assessment & Plan Note (Signed)
She has intermittent abdominal pain, likely due to cancer It could also be contributed by constipation We discussed aggressive laxative therapy For now, she will continue taking narcotic prescription

## 2016-06-02 NOTE — Telephone Encounter (Signed)
-----   Message from Heath Lark, MD sent at 06/02/2016 12:04 PM EDT ----- Regarding: stop prednisone She didn't have much benefit from prednisone Please tell her to stop prednisone

## 2016-06-02 NOTE — Patient Instructions (Signed)

## 2016-06-02 NOTE — Progress Notes (Signed)
[place] OFFICE PROGRESS NOTE  Patient Care Team: Hoyt Koch, MD as PCP - General (Internal Medicine) Fredderick Erb, MD as Consulting Physician (Ophthalmology)  SUMMARY OF ONCOLOGIC HISTORY:   Primary peritoneal carcinomatosis ([place])   11/14/2015 Initial Diagnosis    Primary peritoneal carcinomatosis ([place]). Stage IV based on pulmonary nodules.       Ovarian cancer on right [facility])   11/07/2015 Imaging    Multiple pulmonary nodules bilaterally compatible with pulmonary metastases. Peritoneal carcinomatosis with tumor deposits throughout omentum and at the pericolic gutters as well as within the pelvis. BILATERAL cystic and solid ovarian masses, predominately cystic on LEFT and predominately solid on RIGHT, question ovarian neoplasms versus tumor deposits at the ovaries bilaterally from peritoneal carcinomatosis. Low-attenuation retrocrural nodes. Small RIGHT pleural effusion and minimal perihepatic ascites. Question metastasis at pancreatic tail. Sigmoid diverticulosis. BILATERAL inguinal hernias containing fat. Aortic atherosclerosis.       11/12/2015 Tumor Marker    Patient's tumor was tested for the following markers: CA125. Results of the tumor marker test revealed 301.8.      11/14/2015 Procedure    Peritoneal tumor just deep to the right abdominal wall was targeted. A tangential approach was performed with needle insertion to the left of midline. Three core biopsy needle passes resulted in 2 intact core biopsy specimens and 1 partial tissue fragment.  IMPRESSION: CT-guided core biopsy performed of peritoneal tumor.      11/14/2015 Pathology Results    Soft Tissue Needle Core Biopsy, Peritoneal Cavity - ADENOCARCINOMA. - SEE MICROSCOPIC DESCRIPTION. Microscopic Comment The morphologic features are compatible with metastatic ovarian cancer. There is sufficient material for additional studies if requested. (JDP:gt, 11/17/15) PROGNOSTIC  INDICATORS Results: IMMUNOHISTOCHEMICAL AND MORPHOMETRIC ANALYSIS PERFORMED MANUALLY Estrogen Receptor: 80%, POSITIVE, STRONG STAINING INTENSITY Progesterone Receptor: 10%, POSITIVE, STRONG STAINING INTENSITY REFERENCE RANGE ESTROGEN RECEPTOR NEGATIVE 0% POSITIVE =>1% REFERENCE RANGE PROGESTERONE RECEPTOR NEGATIVE 0% POSITIVE =>1% All controls stained appropriately      12/05/2015 Tumor Marker    Patient's tumor was tested for the following markers: CA125 Results of the tumor marker test revealed 469.2      12/10/2015 Imaging    Innumerable pulmonary nodules of variable size compatible with diffuse pulmonary metastatic disease. Nodules appear minimally larger and there are some new tiny pulmonary nodules in the lung bases compared to 11/07/2015. 2. Moderate and enlarging right pleural effusion. 3. Extensive peritoneal carcinomatosis in the upper abdomen. Possible tumor involving the pancreatic tail. 4. Borderline prominent right hilar lymph node.      12/12/2015 - 03/26/2016 Chemotherapy    She has received 6 cycles of carboplatin & taxol       12/25/2015 Tumor Marker    Patient's tumor was tested for the following markers: CA125 Results of the tumor marker test revealed 739.7      01/01/2016 Procedure    Ultrasound and fluoroscopically guided right internal jugular single lumen power port catheter insertion. Tip in the SVC/RA junction. Catheter ready for use      01/19/2016 Tumor Marker    Patient's tumor was tested for the following markers: CA125 Results of the tumor marker test revealed 421.9      02/04/2016 Imaging    CT CHEST IMPRESSION  1. Slight improvement in pulmonary metastasis. 2. Decrease in low left cervical/supraclavicular adenopathy, favoring response to therapy. 3. Decrease in small right pleural effusion.  CT ABDOMEN AND PELVIS IMPRESSION  1. Improvement in omental/peritoneal metastasis. 2. Enlarged cystic lesion within the left hemipelvis could  represent a primary ovarian malignancy or metastasis. Right ovarian soft tissue fullness and heterogeneity is not significantly changed. 3. Pancreatic body/tail junction hypoattenuation is difficult to quantify secondary to morphology. However, felt to be increased in conspicuity today. Considerations include metastatic disease or a somewhat atypical distribution of pancreatic adenocarcinoma with metastasis. 4. Slight improvement in right common iliac high adenopathy. 5. Aortic atherosclerosis.       02/04/2016 Genetic Testing    Genetics testing normal 02-04-16 (298 gene myRisk panel by [employer]). Tumor BRACAnalysis and Genomic Instability Status Testing was also performed through [employer], negative      02/05/2016 Tumor Marker    Patient's tumor was tested for the following markers: CA125 Results of the tumor marker test revealed 307.1      03/04/2016 Tumor Marker    Patient's tumor was tested for the following markers: CA125 Results of the tumor marker test revealed 180.9      03/26/2016 Tumor Marker    Patient's tumor was tested for the following markers: CA125 Results of the tumor marker test revealed 132.8      04/15/2016 Tumor Marker    Patient's tumor was tested for the following markers: CA125 Results of the tumor marker test revealed 109.4      04/22/2016 Imaging    Today's study demonstrates a mixed response to therapy. Specifically, while the primary ovarian lesions and the omental caking has regressed compared to the prior examination, there is an increased number and size of pulmonary nodules, possible new hepatic lesion, enlarging lesion in the C7 vertebral body, and an enlarging lesion in the distal body of the pancreas. 2. Lesion in the distal body of the pancreas has nonspecific imaging characteristics. This could represent a metastatic lesion. Alternatively, a primary pancreatic neoplasm could be considered. In the appropriate clinical setting, the  possibility of a benign lesion such as a pancreatic pseudocyst could be considered, particularly if there is a recent history of pancreatitis.      05/11/2016 Tumor Marker    Patient's tumor was tested for the following markers: CA125 Results of the tumor marker test revealed 137.6      06/01/2016 Tumor Marker    Patient's tumor was tested for the following markers: CA125 Results of the tumor marker test revealed 182       INTERVAL HISTORY: Please see below for problem oriented charting. She returns today for supportive care Her appetite remains very poor Energy level is poor Her intake is almost negligible She drinks 2-3 Ensure and eat bites of food and yogurt Her estimated calorie intake per day is less than 1000 She was prescribed lidocaine and Carafate as needed for esophagitis due to recent radiation treatment She is mildly constipated and she takes laxatives She has minimum nausea She has intermittent lower abdominal pain that comes and goes  REVIEW OF SYSTEMS:   Constitutional: Denies fevers, chills  Eyes: Denies blurriness of vision Ears, nose, mouth, throat, and face: Denies mucositis or sore throat Respiratory: Denies cough, dyspnea or wheezes Cardiovascular: Denies palpitation, chest discomfort or lower extremity swelling Skin: Denies abnormal skin rashes Lymphatics: Denies new lymphadenopathy or easy bruising Neurological:Denies numbness, tingling or new weaknesses Behavioral/Psych: Mood is stable, no new changes  All other systems were reviewed with the patient and are negative.  I have reviewed the past medical history, past surgical history, social history and family history with the patient and they are unchanged from previous note.  ALLERGIES:  is allergic to codeine.  MEDICATIONS:  Current Outpatient Prescriptions  Medication Sig Dispense Refill  . ciprofloxacin (CIPRO) 250 MG tablet Take 1 tablet (250 mg total) by mouth 2 (two) times daily. 14 tablet 0   . feeding supplement, ENSURE ENLIVE, (ENSURE ENLIVE) LIQD Take 237 mLs by mouth 2 (two) times daily between meals. 237 mL 12  . ibuprofen (ADVIL,MOTRIN) 200 MG tablet Take 200 mg by mouth every 6 (six) hours as needed.    . insulin lispro (HUMALOG KWIKPEN) 100 UNIT/ML KiwkPen Check sugars and if: 0-150 take no insulin, 151-250 take 2 units, 251-300 take 4 units, 301-400, take 6 units, >400 call office. (Patient taking differently: Inject 0-6 Units into the skin at bedtime as needed (for high blood sugar). Check sugars and if: 0-150 take no insulin, 151-250 take 2 units, 251-300 take 4 units, 301-400, take 6 units, >400 call office.) 15 mL 11  . lidocaine (XYLOCAINE) 2 % solution Use as directed 10 mLs in the mouth or throat 3 (three) times daily before meals. 200 mL 1  . lidocaine-prilocaine (EMLA) cream Apply to PortaCath 1-2 hrs prior to access as directed to numb area. 30 g 1  . lisinopril-hydrochlorothiazide (PRINZIDE,ZESTORETIC) 20-12.5 MG tablet take 1 tablet by mouth once daily 90 tablet 3  . loratadine (CLARITIN) 10 MG tablet Take 10 mg by mouth daily as needed for allergies.    Marland Kitchen LORazepam (ATIVAN) 0.5 MG tablet Place 1 tablet under the tongue or swallow every 6 hrs as needed for nausea. Will make drowsy. (Patient taking differently: Take 0.5 mg by mouth every 6 (six) hours as needed (for nausea). ) 30 tablet 0  . magnesium hydroxide (MILK OF MAGNESIA) 400 MG/5ML suspension Take 30 mLs by mouth daily as needed for mild constipation.    . Melatonin 3 MG CAPS Take 3 mg by mouth at bedtime as needed (for sleep).     . metFORMIN (GLUCOPHAGE) 1000 MG tablet take 1 tablet by mouth twice a day with food 180 tablet 3  . mirtazapine (REMERON) 15 MG tablet Take 1 tablet (15 mg total) by mouth at bedtime. 30 tablet 9  . Multiple Vitamin (MULTIVITAMIN WITH MINERALS) TABS tablet Take 1 tablet by mouth daily.    . ondansetron (ZOFRAN) 8 MG tablet Take 1 tablet (8 mg total) by mouth every 8 (eight) hours as  needed for nausea or vomiting (Will not make drowsy.). 30 tablet 1  . polyethylene glycol (MIRALAX / GLYCOLAX) packet Take 17 g by mouth daily as needed for mild constipation.     . predniSONE (DELTASONE) 10 MG tablet Take 1 tablet (10 mg total) by mouth daily with breakfast. 30 tablet 1  . sucralfate (CARAFATE) 1 GM/10ML suspension Take 10 mLs (1 g total) by mouth 4 (four) times daily -  with meals and at bedtime. 420 mL 0  . HYDROmorphone (DILAUDID) 2 MG tablet Take 1 tablet (2 mg total) by mouth every 4 (four) hours as needed for severe pain. (Patient not taking: Reported on 06/02/2016) 30 tablet 0   No current facility-administered medications for this visit.    Facility-Administered Medications Ordered in Other Visits  Medication Dose Route Frequency Provider Last Rate Last Dose  . 0.9 %  sodium chloride infusion   Intravenous Continuous Jacqulynn Cadet, MD      . heparin lock flush 100 unit/mL  500 Units Intracatheter Once PRN Heath Lark, MD      . sodium chloride flush (NS) 0.9 % injection 10 mL  10 mL Intracatheter PRN Dalynn Jhaveri Alvy Bimler,  MD        PHYSICAL EXAMINATION: ECOG PERFORMANCE STATUS: 2 - Symptomatic, <50% confined to bed  Vitals:   06/02/16 1005  BP: 136/66  Pulse: (!) 102  Resp: 18  Temp: 98.4 F (36.9 C)   Filed Weights   06/02/16 1005  Weight: 131 lb 12.8 oz (59.8 kg)    GENERAL:alert, no distress and comfortable. she looks thin and frail SKIN: skin color, texture, turgor are normal, no rashes or significant lesions EYES: normal, Conjunctiva are pink and non-injected, sclera clear Musculoskeletal:no cyanosis of digits and no clubbing  NEURO: alert & oriented x 3 with fluent speech, no focal motor/sensory deficits  LABORATORY DATA:  I have reviewed the data as listed    Component Value Date/Time   NA 137 06/01/2016 0826   K 4.0 06/01/2016 0826   CL 106 05/13/2016 0500   CO2 26 06/01/2016 0826   GLUCOSE 263 (H) 06/01/2016 0826   BUN 17.0 06/01/2016 0826    CREATININE 0.8 06/01/2016 0826   CALCIUM 10.4 06/01/2016 0826   PROT 7.0 06/01/2016 0826   ALBUMIN 2.9 (L) 06/01/2016 0826   AST 24 06/01/2016 0826   ALT 21 06/01/2016 0826   ALKPHOS 77 06/01/2016 0826   BILITOT 0.49 06/01/2016 0826   GFRNONAA >60 05/13/2016 0500   GFRAA >60 05/13/2016 0500    No results found for: SPEP, UPEP  Lab Results  Component Value Date   WBC 14.6 (H) 06/01/2016   NEUTROABS 12.1 (H) 06/01/2016   HGB 9.2 (L) 06/01/2016   HCT 28.0 (L) 06/01/2016   MCV 77.4 (L) 06/01/2016   PLT 247 06/01/2016      Chemistry      Component Value Date/Time   NA 137 06/01/2016 0826   K 4.0 06/01/2016 0826   CL 106 05/13/2016 0500   CO2 26 06/01/2016 0826   BUN 17.0 06/01/2016 0826   CREATININE 0.8 06/01/2016 0826   GLU 191 (H) 02/13/2016 1448      Component Value Date/Time   CALCIUM 10.4 06/01/2016 0826   ALKPHOS 77 06/01/2016 0826   AST 24 06/01/2016 0826   ALT 21 06/01/2016 0826   BILITOT 0.49 06/01/2016 0826       RADIOGRAPHIC STUDIES: I have personally reviewed the radiological images as listed and agreed with the findings in the report. X-ray Chest Pa And Lateral  Result Date: 05/11/2016 CLINICAL DATA:  Fever EXAM: CHEST  2 VIEW COMPARISON:  CT chest 04/22/2016 FINDINGS: Innumerable pulmonary nodules are seen throughout both lungs. Right jugular Port-A-Cath is stable. Lungs are hyperaerated. Tiny right pleural effusion is stable. No definite new consolidation. No pneumothorax. IMPRESSION: Stable innumerable bilateral pulmonary nodules. Electronically Signed   By: Marybelle Killings M.D.   On: 05/11/2016 11:38    ASSESSMENT & PLAN:  Ovarian cancer on right [facility]) The patient is very frail and is experiencing a lot of side effects from recent radiation treatment She will continue supportive IV fluids daily We discussed the guidelines We discussed some of the risks, benefits, side effects of chemotherapy and choices of treatment Ultimately, she settle with  treatment with gemcitabine I recommend we start treatment 3 weeks from now to allow adequate healing from side effects from recent radiation treatment   Anemia due to antineoplastic chemotherapy There is a component of anemia of chronic disease from recent chemotheraoy. The patient denies recent history of bleeding such as epistaxis, hematuria or hematochezia. She is asymptomatic from the anemia. We will observe for now.  Recent  iron studies show high ferritin. I recommend she stops taking iron supplement as it contributes to severe constipation.  Malignant cachexia ([place]) Her appetite is very poor due to malignant cachexia and esophagitis She has not seen much benefit with Remeron Recommend addition of low-dose prednisone daily, but appetite remains poor Recommend stop prednisone  Metastasis to bone [facility]) She denies painful bone lesion.  CT demonstrated T7 involvement. She will be finishing radiation tomorrow  Controlled type 2 diabetes mellitus without complication (H[facility]) Blood sugar is a little worse I have recommended stopping prednisone therapy She will continue insulin treatment  Cancer associated pain She has intermittent abdominal pain, likely due to cancer It could also be contributed by constipation We discussed aggressive laxative therapy For now, she will continue taking narcotic prescription   No orders of the defined types were placed in this encounter.  All questions were answered. The patient knows to call the clinic with any problems, questions or concerns. No barriers to learning was detected. I spent 30 minutes counseling the patient face to face. The total time spent in the appointment was 40 minutes and more than 50% was on counseling and review of test results     Heath Lark, MD 06/02/2016 12:06 PM

## 2016-06-02 NOTE — Assessment & Plan Note (Signed)
Her appetite is very poor due to malignant cachexia and esophagitis She has not seen much benefit with Remeron Recommend addition of low-dose prednisone daily, but appetite remains poor Recommend stop prednisone

## 2016-06-03 ENCOUNTER — Ambulatory Visit (HOSPITAL_BASED_OUTPATIENT_CLINIC_OR_DEPARTMENT_OTHER): Payer: Medicare Other

## 2016-06-03 ENCOUNTER — Encounter: Payer: Self-pay | Admitting: Radiation Oncology

## 2016-06-03 ENCOUNTER — Ambulatory Visit
Admission: RE | Admit: 2016-06-03 | Discharge: 2016-06-03 | Disposition: A | Discharge status: Home / Self Care | Payer: Medicare Other | Source: Ambulatory Visit | Attending: Radiation Oncology | Admitting: Radiation Oncology

## 2016-06-03 VITALS — BP 134/68 | HR 98 | Temp 98.3°F | Resp 16

## 2016-06-03 DIAGNOSIS — C561 Malignant neoplasm of right ovary: Secondary | ICD-10-CM

## 2016-06-03 DIAGNOSIS — C7800 Secondary malignant neoplasm of unspecified lung: Secondary | ICD-10-CM | POA: Diagnosis not present

## 2016-06-03 DIAGNOSIS — C786 Secondary malignant neoplasm of retroperitoneum and peritoneum: Secondary | ICD-10-CM

## 2016-06-03 DIAGNOSIS — G893 Neoplasm related pain (acute) (chronic): Secondary | ICD-10-CM

## 2016-06-03 DIAGNOSIS — R918 Other nonspecific abnormal finding of lung field: Secondary | ICD-10-CM | POA: Diagnosis not present

## 2016-06-03 DIAGNOSIS — Z95828 Presence of other vascular implants and grafts: Secondary | ICD-10-CM

## 2016-06-03 DIAGNOSIS — C801 Malignant (primary) neoplasm, unspecified: Secondary | ICD-10-CM

## 2016-06-03 DIAGNOSIS — C7951 Secondary malignant neoplasm of bone: Secondary | ICD-10-CM | POA: Diagnosis not present

## 2016-06-03 DIAGNOSIS — Z510 Encounter for antineoplastic radiation therapy: Secondary | ICD-10-CM | POA: Diagnosis not present

## 2016-06-03 MED ORDER — HYDROMORPHONE HCL 4 MG/ML IJ SOLN
1.0000 mg | Freq: Once | INTRAMUSCULAR | Status: AC
  Administered 2016-06-03: 1 mg via INTRAVENOUS

## 2016-06-03 MED ORDER — HEPARIN SOD (PORK) LOCK FLUSH 100 UNIT/ML IV SOLN
500.0000 [IU] | Freq: Once | INTRAVENOUS | Status: AC | PRN
  Administered 2016-06-03: 500 [IU]
  Filled 2016-06-03: qty 5

## 2016-06-03 MED ORDER — ONDANSETRON HCL 4 MG/2ML IJ SOLN
INTRAMUSCULAR | Status: AC
  Filled 2016-06-03: qty 4

## 2016-06-03 MED ORDER — SODIUM CHLORIDE 0.9 % IV SOLN: Freq: Once | INTRAVENOUS | Status: DC

## 2016-06-03 MED ORDER — HYDROMORPHONE HCL 4 MG/ML IJ SOLN
INTRAMUSCULAR | Status: AC
  Filled 2016-06-03: qty 1

## 2016-06-03 MED ORDER — SODIUM CHLORIDE 0.9 % IV SOLN
1000.0000 mL | Freq: Once | INTRAVENOUS | Status: AC
  Administered 2016-06-03: 1000 mL via INTRAVENOUS

## 2016-06-03 MED ORDER — ONDANSETRON HCL 4 MG/2ML IJ SOLN
8.0000 mg | Freq: Once | INTRAMUSCULAR | Status: AC
  Administered 2016-06-03: 8 mg via INTRAVENOUS

## 2016-06-03 MED ORDER — SODIUM CHLORIDE 0.9% FLUSH
10.0000 mL | INTRAVENOUS | Status: DC | PRN
  Administered 2016-06-03: 10 mL
  Filled 2016-06-03: qty 10

## 2016-06-03 NOTE — Patient Instructions (Signed)
Dehydration, Adult Dehydration is a condition in which there is not enough fluid or water in the body. This happens when you lose more fluids than you take in. Important organs, such as the kidneys, brain, and heart, cannot function without a proper amount of fluids. Any loss of fluids from the body can lead to dehydration. Dehydration can range from mild to severe. This condition should be treated right away to prevent it from becoming severe. What are the causes? This condition may be caused by:  Vomiting.  Diarrhea.  Excessive sweating, such as from heat exposure or exercise.  Not drinking enough fluid, especially:  When ill.  While doing activity that requires a lot of energy.  Excessive urination.  Fever.  Infection.  Certain medicines, such as medicines that cause the body to lose excess fluid (diuretics).  Inability to access safe drinking water.  Reduced physical ability to get adequate water and food. What increases the risk? This condition is more likely to develop in people:  Who have a poorly controlled long-term (chronic) illness, such as diabetes, heart disease, or kidney disease.  Who are age 65 or older.  Who are disabled.  Who live in a place with high altitude.  Who play endurance sports. What are the signs or symptoms? Symptoms of mild dehydration may include:   Thirst.  Dry lips.  Slightly dry mouth.  Dry, warm skin.  Dizziness. Symptoms of moderate dehydration may include:   Very dry mouth.  Muscle cramps.  Dark urine. Urine may be the color of tea.  Decreased urine production.  Decreased tear production.  Heartbeat that is irregular or faster than normal (palpitations).  Headache.  Light-headedness, especially when you stand up from a sitting position.  Fainting (syncope). Symptoms of severe dehydration may include:   Changes in skin, such as:  Cold and clammy skin.  Blotchy (mottled) or pale skin.  Skin that does  not quickly return to normal after being lightly pinched and released (poor skin turgor).  Changes in body fluids, such as:  Extreme thirst.  No tear production.  Inability to sweat when body temperature is high, such as in hot weather.  Very little urine production.  Changes in vital signs, such as:  Weak pulse.  Pulse that is more than 100 beats a minute when sitting still.  Rapid breathing.  Low blood pressure.  Other changes, such as:  Sunken eyes.  Cold hands and feet.  Confusion.  Lack of energy (lethargy).  Difficulty waking up from sleep.  Short-term weight loss.  Unconsciousness. How is this diagnosed? This condition is diagnosed based on your symptoms and a physical exam. Blood and urine tests may be done to help confirm the diagnosis. How is this treated? Treatment for this condition depends on the severity. Mild or moderate dehydration can often be treated at home. Treatment should be started right away. Do not wait until dehydration becomes severe. Severe dehydration is an emergency and it needs to be treated in a hospital. Treatment for mild dehydration may include:   Drinking more fluids.  Replacing salts and minerals in your blood (electrolytes) that you may have lost. Treatment for moderate dehydration may include:   Drinking an oral rehydration solution (ORS). This is a drink that helps you replace fluids and electrolytes (rehydrate). It can be found at pharmacies and retail stores. Treatment for severe dehydration may include:   Receiving fluids through an IV tube.  Receiving an electrolyte solution through a feeding tube that is   passed through your nose and into your stomach (nasogastric tube, or NG tube).  Correcting any abnormalities in electrolytes.  Treating the underlying cause of dehydration. Follow these instructions at home:  If directed by your health care provider, drink an ORS:  Make an ORS by following instructions on the  package.  Start by drinking small amounts, about  cup (120 mL) every 5-10 minutes.  Slowly increase how much you drink until you have taken the amount recommended by your health care provider.  Drink enough clear fluid to keep your urine clear or pale yellow. If you were told to drink an ORS, finish the ORS first, then start slowly drinking other clear fluids. Drink fluids such as:  Water. Do not drink only water. Doing that can lead to having too little salt (sodium) in the body (hyponatremia).  Ice chips.  Fruit juice that you have added water to (diluted fruit juice).  Low-calorie sports drinks.  Avoid:  Alcohol.  Drinks that contain a lot of sugar. These include high-calorie sports drinks, fruit juice that is not diluted, and soda.  Caffeine.  Foods that are greasy or contain a lot of fat or sugar.  Take over-the-counter and prescription medicines only as told by your health care provider.  Do not take sodium tablets. This can lead to having too much sodium in the body (hypernatremia).  Eat foods that contain a healthy balance of electrolytes, such as bananas, oranges, potatoes, tomatoes, and spinach.  Keep all follow-up visits as told by your health care provider. This is important. Contact a health care provider if:  You have abdominal pain that:  Gets worse.  Stays in one area (localizes).  You have a rash.  You have a stiff neck.  You are more irritable than usual.  You are sleepier or more difficult to wake up than usual.  You feel weak or dizzy.  You feel very thirsty.  You have urinated only a small amount of very dark urine over 6-8 hours. Get help right away if:  You have symptoms of severe dehydration.  You cannot drink fluids without vomiting.  Your symptoms get worse with treatment.  You have a fever.  You have a severe headache.  You have vomiting or diarrhea that:  Gets worse.  Does not go away.  You have blood or green matter  (bile) in your vomit.  You have blood in your stool. This may cause stool to look black and tarry.  You have not urinated in 6-8 hours.  You faint.  Your heart rate while sitting still is over 100 beats a minute.  You have trouble breathing. This information is not intended to replace advice given to you by your health care provider. Make sure you discuss any questions you have with your health care provider. Document Released: 02/15/2005 Document Revised: 09/12/2015 Document Reviewed: 04/11/2015 Elsevier Interactive Patient Education  2017 [employer]  

## 2016-06-04 ENCOUNTER — Ambulatory Visit (HOSPITAL_BASED_OUTPATIENT_CLINIC_OR_DEPARTMENT_OTHER): Payer: Medicare Other

## 2016-06-04 VITALS — BP 134/77 | HR 99 | Temp 98.9°F | Resp 16

## 2016-06-04 DIAGNOSIS — R110 Nausea: Secondary | ICD-10-CM

## 2016-06-04 DIAGNOSIS — C801 Malignant (primary) neoplasm, unspecified: Secondary | ICD-10-CM

## 2016-06-04 DIAGNOSIS — C7951 Secondary malignant neoplasm of bone: Secondary | ICD-10-CM

## 2016-06-04 DIAGNOSIS — C786 Secondary malignant neoplasm of retroperitoneum and peritoneum: Secondary | ICD-10-CM

## 2016-06-04 DIAGNOSIS — C7800 Secondary malignant neoplasm of unspecified lung: Secondary | ICD-10-CM

## 2016-06-04 DIAGNOSIS — C561 Malignant neoplasm of right ovary: Secondary | ICD-10-CM | POA: Diagnosis not present

## 2016-06-04 DIAGNOSIS — Z510 Encounter for antineoplastic radiation therapy: Secondary | ICD-10-CM | POA: Diagnosis not present

## 2016-06-04 DIAGNOSIS — Z95828 Presence of other vascular implants and grafts: Secondary | ICD-10-CM

## 2016-06-04 DIAGNOSIS — R918 Other nonspecific abnormal finding of lung field: Secondary | ICD-10-CM | POA: Diagnosis not present

## 2016-06-04 MED ORDER — SODIUM CHLORIDE 0.9 % IV SOLN
Freq: Once | INTRAVENOUS | Status: AC
  Administered 2016-06-04: 13:00:00 via INTRAVENOUS

## 2016-06-04 MED ORDER — HEPARIN SOD (PORK) LOCK FLUSH 100 UNIT/ML IV SOLN
500.0000 [IU] | Freq: Once | INTRAVENOUS | Status: AC | PRN
  Administered 2016-06-04: 500 [IU]
  Filled 2016-06-04: qty 5

## 2016-06-04 MED ORDER — SODIUM CHLORIDE 0.9% FLUSH
10.0000 mL | INTRAVENOUS | Status: DC | PRN
  Administered 2016-06-04: 10 mL
  Filled 2016-06-04: qty 10

## 2016-06-04 NOTE — Progress Notes (Signed)
  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  Beams/energy:   3D // 10X, 6X  Narrative: The patient tolerated radiation treatment relatively well.  She reports pain with swallowing or trying to eat foods with a thick consistency. She is only able to tolerate liquids or pureed foods, and is supplementing with Ensure. Patient reports having a decline in energy but is still taking care of personal needs.   Plan: The patient has completed radiation treatment. The patient will return to radiation oncology clinic for routine followup in one month. I prescribed Carafate and Xylocaine to help with her difficulty swallowing and throat pain. I advised them to call or return sooner if they have any questions or concerns related to their recovery or treatment.  -----------------------------------  Blair Promise, PhD, MD  This document serves as a record of services personally performed by Gery Pray, MD. It was created on his behalf by Arlyce Harman, a trained medical scribe. The creation of this record is based on the scribe's personal observations and the provider's statements to them. This document has been checked and approved by the attending provider.

## 2016-06-04 NOTE — Patient Instructions (Signed)
Dehydration, Adult Dehydration is a condition in which there is not enough fluid or water in the body. This happens when you lose more fluids than you take in. Important organs, such as the kidneys, brain, and heart, cannot function without a proper amount of fluids. Any loss of fluids from the body can lead to dehydration. Dehydration can range from mild to severe. This condition should be treated right away to prevent it from becoming severe. What are the causes? This condition may be caused by:  Vomiting.  Diarrhea.  Excessive sweating, such as from heat exposure or exercise.  Not drinking enough fluid, especially:  When ill.  While doing activity that requires a lot of energy.  Excessive urination.  Fever.  Infection.  Certain medicines, such as medicines that cause the body to lose excess fluid (diuretics).  Inability to access safe drinking water.  Reduced physical ability to get adequate water and food. What increases the risk? This condition is more likely to develop in people:  Who have a poorly controlled long-term (chronic) illness, such as diabetes, heart disease, or kidney disease.  Who are age 65 or older.  Who are disabled.  Who live in a place with high altitude.  Who play endurance sports. What are the signs or symptoms? Symptoms of mild dehydration may include:   Thirst.  Dry lips.  Slightly dry mouth.  Dry, warm skin.  Dizziness. Symptoms of moderate dehydration may include:   Very dry mouth.  Muscle cramps.  Dark urine. Urine may be the color of tea.  Decreased urine production.  Decreased tear production.  Heartbeat that is irregular or faster than normal (palpitations).  Headache.  Light-headedness, especially when you stand up from a sitting position.  Fainting (syncope). Symptoms of severe dehydration may include:   Changes in skin, such as:  Cold and clammy skin.  Blotchy (mottled) or pale skin.  Skin that does  not quickly return to normal after being lightly pinched and released (poor skin turgor).  Changes in body fluids, such as:  Extreme thirst.  No tear production.  Inability to sweat when body temperature is high, such as in hot weather.  Very little urine production.  Changes in vital signs, such as:  Weak pulse.  Pulse that is more than 100 beats a minute when sitting still.  Rapid breathing.  Low blood pressure.  Other changes, such as:  Sunken eyes.  Cold hands and feet.  Confusion.  Lack of energy (lethargy).  Difficulty waking up from sleep.  Short-term weight loss.  Unconsciousness. How is this diagnosed? This condition is diagnosed based on your symptoms and a physical exam. Blood and urine tests may be done to help confirm the diagnosis. How is this treated? Treatment for this condition depends on the severity. Mild or moderate dehydration can often be treated at home. Treatment should be started right away. Do not wait until dehydration becomes severe. Severe dehydration is an emergency and it needs to be treated in a hospital. Treatment for mild dehydration may include:   Drinking more fluids.  Replacing salts and minerals in your blood (electrolytes) that you may have lost. Treatment for moderate dehydration may include:   Drinking an oral rehydration solution (ORS). This is a drink that helps you replace fluids and electrolytes (rehydrate). It can be found at pharmacies and retail stores. Treatment for severe dehydration may include:   Receiving fluids through an IV tube.  Receiving an electrolyte solution through a feeding tube that is   passed through your nose and into your stomach (nasogastric tube, or NG tube).  Correcting any abnormalities in electrolytes.  Treating the underlying cause of dehydration. Follow these instructions at home:  If directed by your health care provider, drink an ORS:  Make an ORS by following instructions on the  package.  Start by drinking small amounts, about  cup (120 mL) every 5-10 minutes.  Slowly increase how much you drink until you have taken the amount recommended by your health care provider.  Drink enough clear fluid to keep your urine clear or pale yellow. If you were told to drink an ORS, finish the ORS first, then start slowly drinking other clear fluids. Drink fluids such as:  Water. Do not drink only water. Doing that can lead to having too little salt (sodium) in the body (hyponatremia).  Ice chips.  Fruit juice that you have added water to (diluted fruit juice).  Low-calorie sports drinks.  Avoid:  Alcohol.  Drinks that contain a lot of sugar. These include high-calorie sports drinks, fruit juice that is not diluted, and soda.  Caffeine.  Foods that are greasy or contain a lot of fat or sugar.  Take over-the-counter and prescription medicines only as told by your health care provider.  Do not take sodium tablets. This can lead to having too much sodium in the body (hypernatremia).  Eat foods that contain a healthy balance of electrolytes, such as bananas, oranges, potatoes, tomatoes, and spinach.  Keep all follow-up visits as told by your health care provider. This is important. Contact a health care provider if:  You have abdominal pain that:  Gets worse.  Stays in one area (localizes).  You have a rash.  You have a stiff neck.  You are more irritable than usual.  You are sleepier or more difficult to wake up than usual.  You feel weak or dizzy.  You feel very thirsty.  You have urinated only a small amount of very dark urine over 6-8 hours. Get help right away if:  You have symptoms of severe dehydration.  You cannot drink fluids without vomiting.  Your symptoms get worse with treatment.  You have a fever.  You have a severe headache.  You have vomiting or diarrhea that:  Gets worse.  Does not go away.  You have blood or green matter  (bile) in your vomit.  You have blood in your stool. This may cause stool to look black and tarry.  You have not urinated in 6-8 hours.  You faint.  Your heart rate while sitting still is over 100 beats a minute.  You have trouble breathing. This information is not intended to replace advice given to you by your health care provider. Make sure you discuss any questions you have with your health care provider. Document Released: 02/15/2005 Document Revised: 09/12/2015 Document Reviewed: 04/11/2015 Elsevier Interactive Patient Education  2017 [employer]  

## 2016-06-05 ENCOUNTER — Ambulatory Visit (HOSPITAL_BASED_OUTPATIENT_CLINIC_OR_DEPARTMENT_OTHER): Payer: Medicare Other

## 2016-06-05 VITALS — BP 145/91 | HR 97 | Temp 99.1°F | Resp 20

## 2016-06-05 DIAGNOSIS — C7951 Secondary malignant neoplasm of bone: Secondary | ICD-10-CM

## 2016-06-05 DIAGNOSIS — C561 Malignant neoplasm of right ovary: Secondary | ICD-10-CM | POA: Diagnosis not present

## 2016-06-05 DIAGNOSIS — Z95828 Presence of other vascular implants and grafts: Secondary | ICD-10-CM

## 2016-06-05 DIAGNOSIS — C801 Malignant (primary) neoplasm, unspecified: Secondary | ICD-10-CM

## 2016-06-05 DIAGNOSIS — C786 Secondary malignant neoplasm of retroperitoneum and peritoneum: Secondary | ICD-10-CM

## 2016-06-05 MED ORDER — HEPARIN SOD (PORK) LOCK FLUSH 100 UNIT/ML IV SOLN
500.0000 [IU] | Freq: Once | INTRAVENOUS | Status: AC | PRN
  Administered 2016-06-05: 500 [IU]
  Filled 2016-06-05: qty 5

## 2016-06-05 MED ORDER — SODIUM CHLORIDE 0.9% FLUSH
10.0000 mL | INTRAVENOUS | Status: DC | PRN
  Administered 2016-06-05: 10 mL
  Filled 2016-06-05: qty 10

## 2016-06-05 MED ORDER — SODIUM CHLORIDE 0.9 % IV SOLN
Freq: Once | INTRAVENOUS | Status: AC
  Administered 2016-06-05: 10:00:00 via INTRAVENOUS

## 2016-06-05 NOTE — Patient Instructions (Signed)
Dehydration, Adult Dehydration is a condition in which there is not enough fluid or water in the body. This happens when you lose more fluids than you take in. Important organs, such as the kidneys, brain, and heart, cannot function without a proper amount of fluids. Any loss of fluids from the body can lead to dehydration. Dehydration can range from mild to severe. This condition should be treated right away to prevent it from becoming severe. What are the causes? This condition may be caused by:  Vomiting.  Diarrhea.  Excessive sweating, such as from heat exposure or exercise.  Not drinking enough fluid, especially:  When ill.  While doing activity that requires a lot of energy.  Excessive urination.  Fever.  Infection.  Certain medicines, such as medicines that cause the body to lose excess fluid (diuretics).  Inability to access safe drinking water.  Reduced physical ability to get adequate water and food. What increases the risk? This condition is more likely to develop in people:  Who have a poorly controlled long-term (chronic) illness, such as diabetes, heart disease, or kidney disease.  Who are age 65 or older.  Who are disabled.  Who live in a place with high altitude.  Who play endurance sports. What are the signs or symptoms? Symptoms of mild dehydration may include:   Thirst.  Dry lips.  Slightly dry mouth.  Dry, warm skin.  Dizziness. Symptoms of moderate dehydration may include:   Very dry mouth.  Muscle cramps.  Dark urine. Urine may be the color of tea.  Decreased urine production.  Decreased tear production.  Heartbeat that is irregular or faster than normal (palpitations).  Headache.  Light-headedness, especially when you stand up from a sitting position.  Fainting (syncope). Symptoms of severe dehydration may include:   Changes in skin, such as:  Cold and clammy skin.  Blotchy (mottled) or pale skin.  Skin that does  not quickly return to normal after being lightly pinched and released (poor skin turgor).  Changes in body fluids, such as:  Extreme thirst.  No tear production.  Inability to sweat when body temperature is high, such as in hot weather.  Very little urine production.  Changes in vital signs, such as:  Weak pulse.  Pulse that is more than 100 beats a minute when sitting still.  Rapid breathing.  Low blood pressure.  Other changes, such as:  Sunken eyes.  Cold hands and feet.  Confusion.  Lack of energy (lethargy).  Difficulty waking up from sleep.  Short-term weight loss.  Unconsciousness. How is this diagnosed? This condition is diagnosed based on your symptoms and a physical exam. Blood and urine tests may be done to help confirm the diagnosis. How is this treated? Treatment for this condition depends on the severity. Mild or moderate dehydration can often be treated at home. Treatment should be started right away. Do not wait until dehydration becomes severe. Severe dehydration is an emergency and it needs to be treated in a hospital. Treatment for mild dehydration may include:   Drinking more fluids.  Replacing salts and minerals in your blood (electrolytes) that you may have lost. Treatment for moderate dehydration may include:   Drinking an oral rehydration solution (ORS). This is a drink that helps you replace fluids and electrolytes (rehydrate). It can be found at pharmacies and retail stores. Treatment for severe dehydration may include:   Receiving fluids through an IV tube.  Receiving an electrolyte solution through a feeding tube that is   passed through your nose and into your stomach (nasogastric tube, or NG tube).  Correcting any abnormalities in electrolytes.  Treating the underlying cause of dehydration. Follow these instructions at home:  If directed by your health care provider, drink an ORS:  Make an ORS by following instructions on the  package.  Start by drinking small amounts, about  cup (120 mL) every 5-10 minutes.  Slowly increase how much you drink until you have taken the amount recommended by your health care provider.  Drink enough clear fluid to keep your urine clear or pale yellow. If you were told to drink an ORS, finish the ORS first, then start slowly drinking other clear fluids. Drink fluids such as:  Water. Do not drink only water. Doing that can lead to having too little salt (sodium) in the body (hyponatremia).  Ice chips.  Fruit juice that you have added water to (diluted fruit juice).  Low-calorie sports drinks.  Avoid:  Alcohol.  Drinks that contain a lot of sugar. These include high-calorie sports drinks, fruit juice that is not diluted, and soda.  Caffeine.  Foods that are greasy or contain a lot of fat or sugar.  Take over-the-counter and prescription medicines only as told by your health care provider.  Do not take sodium tablets. This can lead to having too much sodium in the body (hypernatremia).  Eat foods that contain a healthy balance of electrolytes, such as bananas, oranges, potatoes, tomatoes, and spinach.  Keep all follow-up visits as told by your health care provider. This is important. Contact a health care provider if:  You have abdominal pain that:  Gets worse.  Stays in one area (localizes).  You have a rash.  You have a stiff neck.  You are more irritable than usual.  You are sleepier or more difficult to wake up than usual.  You feel weak or dizzy.  You feel very thirsty.  You have urinated only a small amount of very dark urine over 6-8 hours. Get help right away if:  You have symptoms of severe dehydration.  You cannot drink fluids without vomiting.  Your symptoms get worse with treatment.  You have a fever.  You have a severe headache.  You have vomiting or diarrhea that:  Gets worse.  Does not go away.  You have blood or green matter  (bile) in your vomit.  You have blood in your stool. This may cause stool to look black and tarry.  You have not urinated in 6-8 hours.  You faint.  Your heart rate while sitting still is over 100 beats a minute.  You have trouble breathing. This information is not intended to replace advice given to you by your health care provider. Make sure you discuss any questions you have with your health care provider. Document Released: 02/15/2005 Document Revised: 09/12/2015 Document Reviewed: 04/11/2015 Elsevier Interactive Patient Education  2017 [employer]  

## 2016-06-07 ENCOUNTER — Ambulatory Visit (HOSPITAL_BASED_OUTPATIENT_CLINIC_OR_DEPARTMENT_OTHER): Payer: Medicare Other

## 2016-06-07 ENCOUNTER — Other Ambulatory Visit: Payer: Self-pay | Admitting: *Deleted

## 2016-06-07 VITALS — BP 111/70 | HR 99 | Temp 98.4°F | Resp 16 | Wt 130.8 lb

## 2016-06-07 DIAGNOSIS — R110 Nausea: Secondary | ICD-10-CM

## 2016-06-07 DIAGNOSIS — C7800 Secondary malignant neoplasm of unspecified lung: Secondary | ICD-10-CM

## 2016-06-07 DIAGNOSIS — C786 Secondary malignant neoplasm of retroperitoneum and peritoneum: Secondary | ICD-10-CM | POA: Diagnosis not present

## 2016-06-07 DIAGNOSIS — C561 Malignant neoplasm of right ovary: Secondary | ICD-10-CM | POA: Diagnosis not present

## 2016-06-07 DIAGNOSIS — C569 Malignant neoplasm of unspecified ovary: Secondary | ICD-10-CM

## 2016-06-07 DIAGNOSIS — C801 Malignant (primary) neoplasm, unspecified: Secondary | ICD-10-CM

## 2016-06-07 DIAGNOSIS — C7951 Secondary malignant neoplasm of bone: Secondary | ICD-10-CM

## 2016-06-07 DIAGNOSIS — Z95828 Presence of other vascular implants and grafts: Secondary | ICD-10-CM

## 2016-06-07 MED ORDER — SODIUM CHLORIDE 0.9% FLUSH
10.0000 mL | INTRAVENOUS | Status: DC | PRN
  Administered 2016-06-07: 10 mL
  Filled 2016-06-07: qty 10

## 2016-06-07 MED ORDER — ONDANSETRON 8 MG PO TBDP: 8.0000 mg | ORAL_TABLET | Freq: Three times a day (TID) | ORAL | 0 refills | Status: DC | PRN

## 2016-06-07 MED ORDER — SODIUM CHLORIDE 0.9 % IV SOLN
Freq: Once | INTRAVENOUS | Status: AC
  Administered 2016-06-07: 09:00:00 via INTRAVENOUS

## 2016-06-07 MED ORDER — LORAZEPAM 0.5 MG PO TABS: ORAL_TABLET | ORAL | 0 refills | Status: DC

## 2016-06-07 MED ORDER — HEPARIN SOD (PORK) LOCK FLUSH 100 UNIT/ML IV SOLN
500.0000 [IU] | Freq: Once | INTRAVENOUS | Status: AC | PRN
  Administered 2016-06-07: 500 [IU]
  Filled 2016-06-07: qty 5

## 2016-06-07 NOTE — Progress Notes (Signed)
Patient's daughter states that patient is taking in only 2 ensures a day and is having difficulty swallowing her medications d/t nausea.  Dr. Alvy Bimler notified.  OK to refill Lorazepam and to send in Zofran ODT for patient per Dr. Alvy Bimler.

## 2016-06-07 NOTE — Patient Instructions (Signed)
Dehydration, Adult Dehydration is a condition in which there is not enough fluid or water in the body. This happens when you lose more fluids than you take in. Important organs, such as the kidneys, brain, and heart, cannot function without a proper amount of fluids. Any loss of fluids from the body can lead to dehydration. Dehydration can range from mild to severe. This condition should be treated right away to prevent it from becoming severe. What are the causes? This condition may be caused by:  Vomiting.  Diarrhea.  Excessive sweating, such as from heat exposure or exercise.  Not drinking enough fluid, especially:  When ill.  While doing activity that requires a lot of energy.  Excessive urination.  Fever.  Infection.  Certain medicines, such as medicines that cause the body to lose excess fluid (diuretics).  Inability to access safe drinking water.  Reduced physical ability to get adequate water and food. What increases the risk? This condition is more likely to develop in people:  Who have a poorly controlled long-term (chronic) illness, such as diabetes, heart disease, or kidney disease.  Who are age 65 or older.  Who are disabled.  Who live in a place with high altitude.  Who play endurance sports. What are the signs or symptoms? Symptoms of mild dehydration may include:   Thirst.  Dry lips.  Slightly dry mouth.  Dry, warm skin.  Dizziness. Symptoms of moderate dehydration may include:   Very dry mouth.  Muscle cramps.  Dark urine. Urine may be the color of tea.  Decreased urine production.  Decreased tear production.  Heartbeat that is irregular or faster than normal (palpitations).  Headache.  Light-headedness, especially when you stand up from a sitting position.  Fainting (syncope). Symptoms of severe dehydration may include:   Changes in skin, such as:  Cold and clammy skin.  Blotchy (mottled) or pale skin.  Skin that does  not quickly return to normal after being lightly pinched and released (poor skin turgor).  Changes in body fluids, such as:  Extreme thirst.  No tear production.  Inability to sweat when body temperature is high, such as in hot weather.  Very little urine production.  Changes in vital signs, such as:  Weak pulse.  Pulse that is more than 100 beats a minute when sitting still.  Rapid breathing.  Low blood pressure.  Other changes, such as:  Sunken eyes.  Cold hands and feet.  Confusion.  Lack of energy (lethargy).  Difficulty waking up from sleep.  Short-term weight loss.  Unconsciousness. How is this diagnosed? This condition is diagnosed based on your symptoms and a physical exam. Blood and urine tests may be done to help confirm the diagnosis. How is this treated? Treatment for this condition depends on the severity. Mild or moderate dehydration can often be treated at home. Treatment should be started right away. Do not wait until dehydration becomes severe. Severe dehydration is an emergency and it needs to be treated in a hospital. Treatment for mild dehydration may include:   Drinking more fluids.  Replacing salts and minerals in your blood (electrolytes) that you may have lost. Treatment for moderate dehydration may include:   Drinking an oral rehydration solution (ORS). This is a drink that helps you replace fluids and electrolytes (rehydrate). It can be found at pharmacies and retail stores. Treatment for severe dehydration may include:   Receiving fluids through an IV tube.  Receiving an electrolyte solution through a feeding tube that is   passed through your nose and into your stomach (nasogastric tube, or NG tube).  Correcting any abnormalities in electrolytes.  Treating the underlying cause of dehydration. Follow these instructions at home:  If directed by your health care provider, drink an ORS:  Make an ORS by following instructions on the  package.  Start by drinking small amounts, about  cup (120 mL) every 5-10 minutes.  Slowly increase how much you drink until you have taken the amount recommended by your health care provider.  Drink enough clear fluid to keep your urine clear or pale yellow. If you were told to drink an ORS, finish the ORS first, then start slowly drinking other clear fluids. Drink fluids such as:  Water. Do not drink only water. Doing that can lead to having too little salt (sodium) in the body (hyponatremia).  Ice chips.  Fruit juice that you have added water to (diluted fruit juice).  Low-calorie sports drinks.  Avoid:  Alcohol.  Drinks that contain a lot of sugar. These include high-calorie sports drinks, fruit juice that is not diluted, and soda.  Caffeine.  Foods that are greasy or contain a lot of fat or sugar.  Take over-the-counter and prescription medicines only as told by your health care provider.  Do not take sodium tablets. This can lead to having too much sodium in the body (hypernatremia).  Eat foods that contain a healthy balance of electrolytes, such as bananas, oranges, potatoes, tomatoes, and spinach.  Keep all follow-up visits as told by your health care provider. This is important. Contact a health care provider if:  You have abdominal pain that:  Gets worse.  Stays in one area (localizes).  You have a rash.  You have a stiff neck.  You are more irritable than usual.  You are sleepier or more difficult to wake up than usual.  You feel weak or dizzy.  You feel very thirsty.  You have urinated only a small amount of very dark urine over 6-8 hours. Get help right away if:  You have symptoms of severe dehydration.  You cannot drink fluids without vomiting.  Your symptoms get worse with treatment.  You have a fever.  You have a severe headache.  You have vomiting or diarrhea that:  Gets worse.  Does not go away.  You have blood or green matter  (bile) in your vomit.  You have blood in your stool. This may cause stool to look black and tarry.  You have not urinated in 6-8 hours.  You faint.  Your heart rate while sitting still is over 100 beats a minute.  You have trouble breathing. This information is not intended to replace advice given to you by your health care provider. Make sure you discuss any questions you have with your health care provider. Document Released: 02/15/2005 Document Revised: 09/12/2015 Document Reviewed: 04/11/2015 Elsevier Interactive Patient Education  2017 [employer]  

## 2016-06-08 ENCOUNTER — Ambulatory Visit (HOSPITAL_BASED_OUTPATIENT_CLINIC_OR_DEPARTMENT_OTHER): Payer: Medicare Other

## 2016-06-08 VITALS — BP 125/82 | HR 96 | Temp 98.6°F | Resp 16

## 2016-06-08 DIAGNOSIS — R110 Nausea: Secondary | ICD-10-CM | POA: Diagnosis not present

## 2016-06-08 DIAGNOSIS — C561 Malignant neoplasm of right ovary: Secondary | ICD-10-CM

## 2016-06-08 DIAGNOSIS — C786 Secondary malignant neoplasm of retroperitoneum and peritoneum: Secondary | ICD-10-CM

## 2016-06-08 DIAGNOSIS — Z95828 Presence of other vascular implants and grafts: Secondary | ICD-10-CM

## 2016-06-08 DIAGNOSIS — C801 Malignant (primary) neoplasm, unspecified: Secondary | ICD-10-CM

## 2016-06-08 DIAGNOSIS — C7951 Secondary malignant neoplasm of bone: Secondary | ICD-10-CM

## 2016-06-08 DIAGNOSIS — C7800 Secondary malignant neoplasm of unspecified lung: Secondary | ICD-10-CM | POA: Diagnosis not present

## 2016-06-08 MED ORDER — SODIUM CHLORIDE 0.9% FLUSH
10.0000 mL | INTRAVENOUS | Status: DC | PRN
  Administered 2016-06-08: 10 mL
  Filled 2016-06-08: qty 10

## 2016-06-08 MED ORDER — SODIUM CHLORIDE 0.9 % IV SOLN
Freq: Once | INTRAVENOUS | Status: AC
  Administered 2016-06-08: 13:00:00 via INTRAVENOUS

## 2016-06-08 MED ORDER — HEPARIN SOD (PORK) LOCK FLUSH 100 UNIT/ML IV SOLN
500.0000 [IU] | Freq: Once | INTRAVENOUS | Status: AC | PRN
  Administered 2016-06-08: 500 [IU]
  Filled 2016-06-08: qty 5

## 2016-06-08 NOTE — Patient Instructions (Signed)
Dehydration, Adult Dehydration is a condition in which there is not enough fluid or water in the body. This happens when you lose more fluids than you take in. Important organs, such as the kidneys, brain, and heart, cannot function without a proper amount of fluids. Any loss of fluids from the body can lead to dehydration. Dehydration can range from mild to severe. This condition should be treated right away to prevent it from becoming severe. What are the causes? This condition may be caused by:  Vomiting.  Diarrhea.  Excessive sweating, such as from heat exposure or exercise.  Not drinking enough fluid, especially:  When ill.  While doing activity that requires a lot of energy.  Excessive urination.  Fever.  Infection.  Certain medicines, such as medicines that cause the body to lose excess fluid (diuretics).  Inability to access safe drinking water.  Reduced physical ability to get adequate water and food. What increases the risk? This condition is more likely to develop in people:  Who have a poorly controlled long-term (chronic) illness, such as diabetes, heart disease, or kidney disease.  Who are age 65 or older.  Who are disabled.  Who live in a place with high altitude.  Who play endurance sports. What are the signs or symptoms? Symptoms of mild dehydration may include:   Thirst.  Dry lips.  Slightly dry mouth.  Dry, warm skin.  Dizziness. Symptoms of moderate dehydration may include:   Very dry mouth.  Muscle cramps.  Dark urine. Urine may be the color of tea.  Decreased urine production.  Decreased tear production.  Heartbeat that is irregular or faster than normal (palpitations).  Headache.  Light-headedness, especially when you stand up from a sitting position.  Fainting (syncope). Symptoms of severe dehydration may include:   Changes in skin, such as:  Cold and clammy skin.  Blotchy (mottled) or pale skin.  Skin that does  not quickly return to normal after being lightly pinched and released (poor skin turgor).  Changes in body fluids, such as:  Extreme thirst.  No tear production.  Inability to sweat when body temperature is high, such as in hot weather.  Very little urine production.  Changes in vital signs, such as:  Weak pulse.  Pulse that is more than 100 beats a minute when sitting still.  Rapid breathing.  Low blood pressure.  Other changes, such as:  Sunken eyes.  Cold hands and feet.  Confusion.  Lack of energy (lethargy).  Difficulty waking up from sleep.  Short-term weight loss.  Unconsciousness. How is this diagnosed? This condition is diagnosed based on your symptoms and a physical exam. Blood and urine tests may be done to help confirm the diagnosis. How is this treated? Treatment for this condition depends on the severity. Mild or moderate dehydration can often be treated at home. Treatment should be started right away. Do not wait until dehydration becomes severe. Severe dehydration is an emergency and it needs to be treated in a hospital. Treatment for mild dehydration may include:   Drinking more fluids.  Replacing salts and minerals in your blood (electrolytes) that you may have lost. Treatment for moderate dehydration may include:   Drinking an oral rehydration solution (ORS). This is a drink that helps you replace fluids and electrolytes (rehydrate). It can be found at pharmacies and retail stores. Treatment for severe dehydration may include:   Receiving fluids through an IV tube.  Receiving an electrolyte solution through a feeding tube that is   passed through your nose and into your stomach (nasogastric tube, or NG tube).  Correcting any abnormalities in electrolytes.  Treating the underlying cause of dehydration. Follow these instructions at home:  If directed by your health care provider, drink an ORS:  Make an ORS by following instructions on the  package.  Start by drinking small amounts, about  cup (120 mL) every 5-10 minutes.  Slowly increase how much you drink until you have taken the amount recommended by your health care provider.  Drink enough clear fluid to keep your urine clear or pale yellow. If you were told to drink an ORS, finish the ORS first, then start slowly drinking other clear fluids. Drink fluids such as:  Water. Do not drink only water. Doing that can lead to having too little salt (sodium) in the body (hyponatremia).  Ice chips.  Fruit juice that you have added water to (diluted fruit juice).  Low-calorie sports drinks.  Avoid:  Alcohol.  Drinks that contain a lot of sugar. These include high-calorie sports drinks, fruit juice that is not diluted, and soda.  Caffeine.  Foods that are greasy or contain a lot of fat or sugar.  Take over-the-counter and prescription medicines only as told by your health care provider.  Do not take sodium tablets. This can lead to having too much sodium in the body (hypernatremia).  Eat foods that contain a healthy balance of electrolytes, such as bananas, oranges, potatoes, tomatoes, and spinach.  Keep all follow-up visits as told by your health care provider. This is important. Contact a health care provider if:  You have abdominal pain that:  Gets worse.  Stays in one area (localizes).  You have a rash.  You have a stiff neck.  You are more irritable than usual.  You are sleepier or more difficult to wake up than usual.  You feel weak or dizzy.  You feel very thirsty.  You have urinated only a small amount of very dark urine over 6-8 hours. Get help right away if:  You have symptoms of severe dehydration.  You cannot drink fluids without vomiting.  Your symptoms get worse with treatment.  You have a fever.  You have a severe headache.  You have vomiting or diarrhea that:  Gets worse.  Does not go away.  You have blood or green matter  (bile) in your vomit.  You have blood in your stool. This may cause stool to look black and tarry.  You have not urinated in 6-8 hours.  You faint.  Your heart rate while sitting still is over 100 beats a minute.  You have trouble breathing. This information is not intended to replace advice given to you by your health care provider. Make sure you discuss any questions you have with your health care provider. Document Released: 02/15/2005 Document Revised: 09/12/2015 Document Reviewed: 04/11/2015 Elsevier Interactive Patient Education  2017 [employer]  

## 2016-06-09 ENCOUNTER — Ambulatory Visit (HOSPITAL_BASED_OUTPATIENT_CLINIC_OR_DEPARTMENT_OTHER): Payer: Medicare Other

## 2016-06-09 VITALS — BP 133/70 | HR 99 | Temp 99.0°F | Resp 16

## 2016-06-09 DIAGNOSIS — C801 Malignant (primary) neoplasm, unspecified: Secondary | ICD-10-CM

## 2016-06-09 DIAGNOSIS — C7800 Secondary malignant neoplasm of unspecified lung: Secondary | ICD-10-CM | POA: Diagnosis not present

## 2016-06-09 DIAGNOSIS — C561 Malignant neoplasm of right ovary: Secondary | ICD-10-CM

## 2016-06-09 DIAGNOSIS — C7951 Secondary malignant neoplasm of bone: Secondary | ICD-10-CM

## 2016-06-09 DIAGNOSIS — Z95828 Presence of other vascular implants and grafts: Secondary | ICD-10-CM

## 2016-06-09 DIAGNOSIS — C786 Secondary malignant neoplasm of retroperitoneum and peritoneum: Secondary | ICD-10-CM

## 2016-06-09 MED ORDER — HEPARIN SOD (PORK) LOCK FLUSH 100 UNIT/ML IV SOLN
500.0000 [IU] | Freq: Once | INTRAVENOUS | Status: AC | PRN
  Administered 2016-06-09: 500 [IU]
  Filled 2016-06-09: qty 5

## 2016-06-09 MED ORDER — SODIUM CHLORIDE 0.9 % IV SOLN
Freq: Once | INTRAVENOUS | Status: AC
  Administered 2016-06-09: 09:00:00 via INTRAVENOUS

## 2016-06-09 MED ORDER — SODIUM CHLORIDE 0.9% FLUSH
10.0000 mL | INTRAVENOUS | Status: DC | PRN
Start: 2016-06-09 — End: 2016-06-09
  Administered 2016-06-09: 10 mL
  Filled 2016-06-09: qty 10

## 2016-06-09 NOTE — Patient Instructions (Signed)
Dehydration, Adult Dehydration is a condition in which there is not enough fluid or water in the body. This happens when you lose more fluids than you take in. Important organs, such as the kidneys, brain, and heart, cannot function without a proper amount of fluids. Any loss of fluids from the body can lead to dehydration. Dehydration can range from mild to severe. This condition should be treated right away to prevent it from becoming severe. What are the causes? This condition may be caused by:  Vomiting.  Diarrhea.  Excessive sweating, such as from heat exposure or exercise.  Not drinking enough fluid, especially:  When ill.  While doing activity that requires a lot of energy.  Excessive urination.  Fever.  Infection.  Certain medicines, such as medicines that cause the body to lose excess fluid (diuretics).  Inability to access safe drinking water.  Reduced physical ability to get adequate water and food. What increases the risk? This condition is more likely to develop in people:  Who have a poorly controlled long-term (chronic) illness, such as diabetes, heart disease, or kidney disease.  Who are age 65 or older.  Who are disabled.  Who live in a place with high altitude.  Who play endurance sports. What are the signs or symptoms? Symptoms of mild dehydration may include:   Thirst.  Dry lips.  Slightly dry mouth.  Dry, warm skin.  Dizziness. Symptoms of moderate dehydration may include:   Very dry mouth.  Muscle cramps.  Dark urine. Urine may be the color of tea.  Decreased urine production.  Decreased tear production.  Heartbeat that is irregular or faster than normal (palpitations).  Headache.  Light-headedness, especially when you stand up from a sitting position.  Fainting (syncope). Symptoms of severe dehydration may include:   Changes in skin, such as:  Cold and clammy skin.  Blotchy (mottled) or pale skin.  Skin that does  not quickly return to normal after being lightly pinched and released (poor skin turgor).  Changes in body fluids, such as:  Extreme thirst.  No tear production.  Inability to sweat when body temperature is high, such as in hot weather.  Very little urine production.  Changes in vital signs, such as:  Weak pulse.  Pulse that is more than 100 beats a minute when sitting still.  Rapid breathing.  Low blood pressure.  Other changes, such as:  Sunken eyes.  Cold hands and feet.  Confusion.  Lack of energy (lethargy).  Difficulty waking up from sleep.  Short-term weight loss.  Unconsciousness. How is this diagnosed? This condition is diagnosed based on your symptoms and a physical exam. Blood and urine tests may be done to help confirm the diagnosis. How is this treated? Treatment for this condition depends on the severity. Mild or moderate dehydration can often be treated at home. Treatment should be started right away. Do not wait until dehydration becomes severe. Severe dehydration is an emergency and it needs to be treated in a hospital. Treatment for mild dehydration may include:   Drinking more fluids.  Replacing salts and minerals in your blood (electrolytes) that you may have lost. Treatment for moderate dehydration may include:   Drinking an oral rehydration solution (ORS). This is a drink that helps you replace fluids and electrolytes (rehydrate). It can be found at pharmacies and retail stores. Treatment for severe dehydration may include:   Receiving fluids through an IV tube.  Receiving an electrolyte solution through a feeding tube that is   passed through your nose and into your stomach (nasogastric tube, or NG tube).  Correcting any abnormalities in electrolytes.  Treating the underlying cause of dehydration. Follow these instructions at home:  If directed by your health care provider, drink an ORS:  Make an ORS by following instructions on the  package.  Start by drinking small amounts, about  cup (120 mL) every 5-10 minutes.  Slowly increase how much you drink until you have taken the amount recommended by your health care provider.  Drink enough clear fluid to keep your urine clear or pale yellow. If you were told to drink an ORS, finish the ORS first, then start slowly drinking other clear fluids. Drink fluids such as:  Water. Do not drink only water. Doing that can lead to having too little salt (sodium) in the body (hyponatremia).  Ice chips.  Fruit juice that you have added water to (diluted fruit juice).  Low-calorie sports drinks.  Avoid:  Alcohol.  Drinks that contain a lot of sugar. These include high-calorie sports drinks, fruit juice that is not diluted, and soda.  Caffeine.  Foods that are greasy or contain a lot of fat or sugar.  Take over-the-counter and prescription medicines only as told by your health care provider.  Do not take sodium tablets. This can lead to having too much sodium in the body (hypernatremia).  Eat foods that contain a healthy balance of electrolytes, such as bananas, oranges, potatoes, tomatoes, and spinach.  Keep all follow-up visits as told by your health care provider. This is important. Contact a health care provider if:  You have abdominal pain that:  Gets worse.  Stays in one area (localizes).  You have a rash.  You have a stiff neck.  You are more irritable than usual.  You are sleepier or more difficult to wake up than usual.  You feel weak or dizzy.  You feel very thirsty.  You have urinated only a small amount of very dark urine over 6-8 hours. Get help right away if:  You have symptoms of severe dehydration.  You cannot drink fluids without vomiting.  Your symptoms get worse with treatment.  You have a fever.  You have a severe headache.  You have vomiting or diarrhea that:  Gets worse.  Does not go away.  You have blood or green matter  (bile) in your vomit.  You have blood in your stool. This may cause stool to look black and tarry.  You have not urinated in 6-8 hours.  You faint.  Your heart rate while sitting still is over 100 beats a minute.  You have trouble breathing. This information is not intended to replace advice given to you by your health care provider. Make sure you discuss any questions you have with your health care provider. Document Released: 02/15/2005 Document Revised: 09/12/2015 Document Reviewed: 04/11/2015 Elsevier Interactive Patient Education  2017 [employer]  

## 2016-06-10 ENCOUNTER — Ambulatory Visit (HOSPITAL_BASED_OUTPATIENT_CLINIC_OR_DEPARTMENT_OTHER): Payer: Medicare Other

## 2016-06-10 VITALS — BP 130/91 | HR 97 | Temp 98.0°F | Resp 18

## 2016-06-10 DIAGNOSIS — C561 Malignant neoplasm of right ovary: Secondary | ICD-10-CM | POA: Diagnosis not present

## 2016-06-10 DIAGNOSIS — C7800 Secondary malignant neoplasm of unspecified lung: Secondary | ICD-10-CM

## 2016-06-10 DIAGNOSIS — C786 Secondary malignant neoplasm of retroperitoneum and peritoneum: Secondary | ICD-10-CM | POA: Diagnosis not present

## 2016-06-10 DIAGNOSIS — C801 Malignant (primary) neoplasm, unspecified: Secondary | ICD-10-CM

## 2016-06-10 DIAGNOSIS — C7951 Secondary malignant neoplasm of bone: Secondary | ICD-10-CM | POA: Diagnosis not present

## 2016-06-10 DIAGNOSIS — Z95828 Presence of other vascular implants and grafts: Secondary | ICD-10-CM

## 2016-06-10 MED ORDER — HEPARIN SOD (PORK) LOCK FLUSH 100 UNIT/ML IV SOLN
500.0000 [IU] | Freq: Once | INTRAVENOUS | Status: AC | PRN
  Administered 2016-06-10: 500 [IU]
  Filled 2016-06-10: qty 5

## 2016-06-10 MED ORDER — SODIUM CHLORIDE 0.9 % IV SOLN
Freq: Once | INTRAVENOUS | Status: AC
  Administered 2016-06-10: 11:00:00 via INTRAVENOUS

## 2016-06-10 MED ORDER — SODIUM CHLORIDE 0.9% FLUSH
10.0000 mL | INTRAVENOUS | Status: DC | PRN
  Administered 2016-06-10: 10 mL
  Filled 2016-06-10: qty 10

## 2016-06-10 NOTE — Patient Instructions (Signed)
Dehydration, Adult Dehydration is a condition in which there is not enough fluid or water in the body. This happens when you lose more fluids than you take in. Important organs, such as the kidneys, brain, and heart, cannot function without a proper amount of fluids. Any loss of fluids from the body can lead to dehydration. Dehydration can range from mild to severe. This condition should be treated right away to prevent it from becoming severe. What are the causes? This condition may be caused by:  Vomiting.  Diarrhea.  Excessive sweating, such as from heat exposure or exercise.  Not drinking enough fluid, especially:  When ill.  While doing activity that requires a lot of energy.  Excessive urination.  Fever.  Infection.  Certain medicines, such as medicines that cause the body to lose excess fluid (diuretics).  Inability to access safe drinking water.  Reduced physical ability to get adequate water and food. What increases the risk? This condition is more likely to develop in people:  Who have a poorly controlled long-term (chronic) illness, such as diabetes, heart disease, or kidney disease.  Who are age 65 or older.  Who are disabled.  Who live in a place with high altitude.  Who play endurance sports. What are the signs or symptoms? Symptoms of mild dehydration may include:   Thirst.  Dry lips.  Slightly dry mouth.  Dry, warm skin.  Dizziness. Symptoms of moderate dehydration may include:   Very dry mouth.  Muscle cramps.  Dark urine. Urine may be the color of tea.  Decreased urine production.  Decreased tear production.  Heartbeat that is irregular or faster than normal (palpitations).  Headache.  Light-headedness, especially when you stand up from a sitting position.  Fainting (syncope). Symptoms of severe dehydration may include:   Changes in skin, such as:  Cold and clammy skin.  Blotchy (mottled) or pale skin.  Skin that does  not quickly return to normal after being lightly pinched and released (poor skin turgor).  Changes in body fluids, such as:  Extreme thirst.  No tear production.  Inability to sweat when body temperature is high, such as in hot weather.  Very little urine production.  Changes in vital signs, such as:  Weak pulse.  Pulse that is more than 100 beats a minute when sitting still.  Rapid breathing.  Low blood pressure.  Other changes, such as:  Sunken eyes.  Cold hands and feet.  Confusion.  Lack of energy (lethargy).  Difficulty waking up from sleep.  Short-term weight loss.  Unconsciousness. How is this diagnosed? This condition is diagnosed based on your symptoms and a physical exam. Blood and urine tests may be done to help confirm the diagnosis. How is this treated? Treatment for this condition depends on the severity. Mild or moderate dehydration can often be treated at home. Treatment should be started right away. Do not wait until dehydration becomes severe. Severe dehydration is an emergency and it needs to be treated in a hospital. Treatment for mild dehydration may include:   Drinking more fluids.  Replacing salts and minerals in your blood (electrolytes) that you may have lost. Treatment for moderate dehydration may include:   Drinking an oral rehydration solution (ORS). This is a drink that helps you replace fluids and electrolytes (rehydrate). It can be found at pharmacies and retail stores. Treatment for severe dehydration may include:   Receiving fluids through an IV tube.  Receiving an electrolyte solution through a feeding tube that is   passed through your nose and into your stomach (nasogastric tube, or NG tube).  Correcting any abnormalities in electrolytes.  Treating the underlying cause of dehydration. Follow these instructions at home:  If directed by your health care provider, drink an ORS:  Make an ORS by following instructions on the  package.  Start by drinking small amounts, about  cup (120 mL) every 5-10 minutes.  Slowly increase how much you drink until you have taken the amount recommended by your health care provider.  Drink enough clear fluid to keep your urine clear or pale yellow. If you were told to drink an ORS, finish the ORS first, then start slowly drinking other clear fluids. Drink fluids such as:  Water. Do not drink only water. Doing that can lead to having too little salt (sodium) in the body (hyponatremia).  Ice chips.  Fruit juice that you have added water to (diluted fruit juice).  Low-calorie sports drinks.  Avoid:  Alcohol.  Drinks that contain a lot of sugar. These include high-calorie sports drinks, fruit juice that is not diluted, and soda.  Caffeine.  Foods that are greasy or contain a lot of fat or sugar.  Take over-the-counter and prescription medicines only as told by your health care provider.  Do not take sodium tablets. This can lead to having too much sodium in the body (hypernatremia).  Eat foods that contain a healthy balance of electrolytes, such as bananas, oranges, potatoes, tomatoes, and spinach.  Keep all follow-up visits as told by your health care provider. This is important. Contact a health care provider if:  You have abdominal pain that:  Gets worse.  Stays in one area (localizes).  You have a rash.  You have a stiff neck.  You are more irritable than usual.  You are sleepier or more difficult to wake up than usual.  You feel weak or dizzy.  You feel very thirsty.  You have urinated only a small amount of very dark urine over 6-8 hours. Get help right away if:  You have symptoms of severe dehydration.  You cannot drink fluids without vomiting.  Your symptoms get worse with treatment.  You have a fever.  You have a severe headache.  You have vomiting or diarrhea that:  Gets worse.  Does not go away.  You have blood or green matter  (bile) in your vomit.  You have blood in your stool. This may cause stool to look black and tarry.  You have not urinated in 6-8 hours.  You faint.  Your heart rate while sitting still is over 100 beats a minute.  You have trouble breathing. This information is not intended to replace advice given to you by your health care provider. Make sure you discuss any questions you have with your health care provider. Document Released: 02/15/2005 Document Revised: 09/12/2015 Document Reviewed: 04/11/2015 Elsevier Interactive Patient Education  2017 [employer]  

## 2016-06-11 ENCOUNTER — Ambulatory Visit (HOSPITAL_BASED_OUTPATIENT_CLINIC_OR_DEPARTMENT_OTHER): Payer: Medicare Other

## 2016-06-11 VITALS — BP 148/78 | HR 94 | Temp 98.6°F | Resp 18

## 2016-06-11 DIAGNOSIS — Z95828 Presence of other vascular implants and grafts: Secondary | ICD-10-CM

## 2016-06-11 DIAGNOSIS — C561 Malignant neoplasm of right ovary: Secondary | ICD-10-CM | POA: Diagnosis not present

## 2016-06-11 DIAGNOSIS — C7800 Secondary malignant neoplasm of unspecified lung: Secondary | ICD-10-CM

## 2016-06-11 DIAGNOSIS — C7951 Secondary malignant neoplasm of bone: Secondary | ICD-10-CM

## 2016-06-11 DIAGNOSIS — C786 Secondary malignant neoplasm of retroperitoneum and peritoneum: Secondary | ICD-10-CM | POA: Diagnosis not present

## 2016-06-11 DIAGNOSIS — C801 Malignant (primary) neoplasm, unspecified: Secondary | ICD-10-CM

## 2016-06-11 MED ORDER — ONDANSETRON HCL 4 MG/2ML IJ SOLN
8.0000 mg | Freq: Once | INTRAMUSCULAR | Status: AC
  Administered 2016-06-11: 8 mg via INTRAVENOUS

## 2016-06-11 MED ORDER — HEPARIN SOD (PORK) LOCK FLUSH 100 UNIT/ML IV SOLN
500.0000 [IU] | Freq: Once | INTRAVENOUS | Status: AC
  Administered 2016-06-11: 500 [IU]
  Filled 2016-06-11: qty 5

## 2016-06-11 MED ORDER — SODIUM CHLORIDE 0.9 % IV SOLN
Freq: Once | INTRAVENOUS | Status: AC
  Administered 2016-06-11: 11:00:00 via INTRAVENOUS

## 2016-06-11 MED ORDER — SODIUM CHLORIDE 0.9% FLUSH
10.0000 mL | Freq: Once | INTRAVENOUS | Status: AC
  Administered 2016-06-11: 10 mL
  Filled 2016-06-11: qty 10

## 2016-06-11 MED ORDER — ONDANSETRON HCL 4 MG/2ML IJ SOLN
INTRAMUSCULAR | Status: AC
  Filled 2016-06-11: qty 4

## 2016-06-11 NOTE — Patient Instructions (Signed)
Dehydration, Adult Dehydration is a condition in which there is not enough fluid or water in the body. This happens when you lose more fluids than you take in. Important organs, such as the kidneys, brain, and heart, cannot function without a proper amount of fluids. Any loss of fluids from the body can lead to dehydration. Dehydration can range from mild to severe. This condition should be treated right away to prevent it from becoming severe. What are the causes? This condition may be caused by:  Vomiting.  Diarrhea.  Excessive sweating, such as from heat exposure or exercise.  Not drinking enough fluid, especially:  When ill.  While doing activity that requires a lot of energy.  Excessive urination.  Fever.  Infection.  Certain medicines, such as medicines that cause the body to lose excess fluid (diuretics).  Inability to access safe drinking water.  Reduced physical ability to get adequate water and food. What increases the risk? This condition is more likely to develop in people:  Who have a poorly controlled long-term (chronic) illness, such as diabetes, heart disease, or kidney disease.  Who are age 65 or older.  Who are disabled.  Who live in a place with high altitude.  Who play endurance sports. What are the signs or symptoms? Symptoms of mild dehydration may include:   Thirst.  Dry lips.  Slightly dry mouth.  Dry, warm skin.  Dizziness. Symptoms of moderate dehydration may include:   Very dry mouth.  Muscle cramps.  Dark urine. Urine may be the color of tea.  Decreased urine production.  Decreased tear production.  Heartbeat that is irregular or faster than normal (palpitations).  Headache.  Light-headedness, especially when you stand up from a sitting position.  Fainting (syncope). Symptoms of severe dehydration may include:   Changes in skin, such as:  Cold and clammy skin.  Blotchy (mottled) or pale skin.  Skin that does  not quickly return to normal after being lightly pinched and released (poor skin turgor).  Changes in body fluids, such as:  Extreme thirst.  No tear production.  Inability to sweat when body temperature is high, such as in hot weather.  Very little urine production.  Changes in vital signs, such as:  Weak pulse.  Pulse that is more than 100 beats a minute when sitting still.  Rapid breathing.  Low blood pressure.  Other changes, such as:  Sunken eyes.  Cold hands and feet.  Confusion.  Lack of energy (lethargy).  Difficulty waking up from sleep.  Short-term weight loss.  Unconsciousness. How is this diagnosed? This condition is diagnosed based on your symptoms and a physical exam. Blood and urine tests may be done to help confirm the diagnosis. How is this treated? Treatment for this condition depends on the severity. Mild or moderate dehydration can often be treated at home. Treatment should be started right away. Do not wait until dehydration becomes severe. Severe dehydration is an emergency and it needs to be treated in a hospital. Treatment for mild dehydration may include:   Drinking more fluids.  Replacing salts and minerals in your blood (electrolytes) that you may have lost. Treatment for moderate dehydration may include:   Drinking an oral rehydration solution (ORS). This is a drink that helps you replace fluids and electrolytes (rehydrate). It can be found at pharmacies and retail stores. Treatment for severe dehydration may include:   Receiving fluids through an IV tube.  Receiving an electrolyte solution through a feeding tube that is   passed through your nose and into your stomach (nasogastric tube, or NG tube).  Correcting any abnormalities in electrolytes.  Treating the underlying cause of dehydration. Follow these instructions at home:  If directed by your health care provider, drink an ORS:  Make an ORS by following instructions on the  package.  Start by drinking small amounts, about  cup (120 mL) every 5-10 minutes.  Slowly increase how much you drink until you have taken the amount recommended by your health care provider.  Drink enough clear fluid to keep your urine clear or pale yellow. If you were told to drink an ORS, finish the ORS first, then start slowly drinking other clear fluids. Drink fluids such as:  Water. Do not drink only water. Doing that can lead to having too little salt (sodium) in the body (hyponatremia).  Ice chips.  Fruit juice that you have added water to (diluted fruit juice).  Low-calorie sports drinks.  Avoid:  Alcohol.  Drinks that contain a lot of sugar. These include high-calorie sports drinks, fruit juice that is not diluted, and soda.  Caffeine.  Foods that are greasy or contain a lot of fat or sugar.  Take over-the-counter and prescription medicines only as told by your health care provider.  Do not take sodium tablets. This can lead to having too much sodium in the body (hypernatremia).  Eat foods that contain a healthy balance of electrolytes, such as bananas, oranges, potatoes, tomatoes, and spinach.  Keep all follow-up visits as told by your health care provider. This is important. Contact a health care provider if:  You have abdominal pain that:  Gets worse.  Stays in one area (localizes).  You have a rash.  You have a stiff neck.  You are more irritable than usual.  You are sleepier or more difficult to wake up than usual.  You feel weak or dizzy.  You feel very thirsty.  You have urinated only a small amount of very dark urine over 6-8 hours. Get help right away if:  You have symptoms of severe dehydration.  You cannot drink fluids without vomiting.  Your symptoms get worse with treatment.  You have a fever.  You have a severe headache.  You have vomiting or diarrhea that:  Gets worse.  Does not go away.  You have blood or green matter  (bile) in your vomit.  You have blood in your stool. This may cause stool to look black and tarry.  You have not urinated in 6-8 hours.  You faint.  Your heart rate while sitting still is over 100 beats a minute.  You have trouble breathing. This information is not intended to replace advice given to you by your health care provider. Make sure you discuss any questions you have with your health care provider. Document Released: 02/15/2005 Document Revised: 09/12/2015 Document Reviewed: 04/11/2015 Elsevier Interactive Patient Education  2017 [employer]  

## 2016-06-14 ENCOUNTER — Ambulatory Visit (HOSPITAL_BASED_OUTPATIENT_CLINIC_OR_DEPARTMENT_OTHER): Payer: Medicare Other

## 2016-06-14 VITALS — BP 139/77 | HR 96 | Temp 98.7°F | Resp 18

## 2016-06-14 DIAGNOSIS — C561 Malignant neoplasm of right ovary: Secondary | ICD-10-CM

## 2016-06-14 DIAGNOSIS — C7800 Secondary malignant neoplasm of unspecified lung: Secondary | ICD-10-CM | POA: Diagnosis not present

## 2016-06-14 DIAGNOSIS — R112 Nausea with vomiting, unspecified: Secondary | ICD-10-CM

## 2016-06-14 DIAGNOSIS — C786 Secondary malignant neoplasm of retroperitoneum and peritoneum: Secondary | ICD-10-CM

## 2016-06-14 DIAGNOSIS — Z95828 Presence of other vascular implants and grafts: Secondary | ICD-10-CM

## 2016-06-14 DIAGNOSIS — C7951 Secondary malignant neoplasm of bone: Secondary | ICD-10-CM | POA: Diagnosis not present

## 2016-06-14 DIAGNOSIS — C801 Malignant (primary) neoplasm, unspecified: Secondary | ICD-10-CM

## 2016-06-14 MED ORDER — SODIUM CHLORIDE 0.9% FLUSH
10.0000 mL | INTRAVENOUS | Status: DC | PRN
  Administered 2016-06-14: 10 mL
  Filled 2016-06-14: qty 10

## 2016-06-14 MED ORDER — HEPARIN SOD (PORK) LOCK FLUSH 100 UNIT/ML IV SOLN
250.0000 [IU] | Freq: Once | INTRAVENOUS | Status: AC | PRN
  Administered 2016-06-14: 250 [IU]
  Filled 2016-06-14: qty 5

## 2016-06-14 MED ORDER — SODIUM CHLORIDE 0.9 % IV SOLN
Freq: Once | INTRAVENOUS | Status: AC
  Administered 2016-06-14: 15:00:00 via INTRAVENOUS

## 2016-06-14 NOTE — Patient Instructions (Signed)
Dehydration, Adult Dehydration is when there is not enough fluid or water in your body. This happens when you lose more fluids than you take in. Dehydration can range from mild to very bad. It should be treated right away to keep it from getting very bad. Symptoms of mild dehydration may include:   Thirst.  Dry lips.  Slightly dry mouth.  Dry, warm skin.  Dizziness. Symptoms of moderate dehydration may include:   Very dry mouth.  Muscle cramps.  Dark pee (urine). Pee may be the color of tea.  Your body making less pee.  Your eyes making fewer tears.  Heartbeat that is uneven or faster than normal (palpitations).  Headache.  Light-headedness, especially when you stand up from sitting.  Fainting (syncope). Symptoms of very bad dehydration may include:   Changes in skin, such as:  Cold and clammy skin.  Blotchy (mottled) or pale skin.  Skin that does not quickly return to normal after being lightly pinched and let go (poor skin turgor).  Changes in body fluids, such as:  Feeling very thirsty.  Your eyes making fewer tears.  Not sweating when body temperature is high, such as in hot weather.  Your body making very little pee.  Changes in vital signs, such as:  Weak pulse.  Pulse that is more than 100 beats a minute when you are sitting still.  Fast breathing.  Low blood pressure.  Other changes, such as:  Sunken eyes.  Cold hands and feet.  Confusion.  Lack of energy (lethargy).  Trouble waking up from sleep.  Short-term weight loss.  Unconsciousness. Follow these instructions at home:  If told by your doctor, drink an ORS:  Make an ORS by using instructions on the package.  Start by drinking small amounts, about  cup (120 mL) every 5-10 minutes.  Slowly drink more until you have had the amount that your doctor said to have.  Drink enough clear fluid to keep your pee clear or pale yellow. If you were told to drink an ORS, finish the  ORS first, then start slowly drinking clear fluids. Drink fluids such as:  Water. Do not drink only water by itself. Doing that can make the salt (sodium) level in your body get too low (hyponatremia).  Ice chips.  Fruit juice that you have added water to (diluted).  Low-calorie sports drinks.  Avoid:  Alcohol.  Drinks that have a lot of sugar. These include high-calorie sports drinks, fruit juice that does not have water added, and soda.  Caffeine.  Foods that are greasy or have a lot of fat or sugar.  Take over-the-counter and prescription medicines only as told by your doctor.  Do not take salt tablets. Doing that can make the salt level in your body get too high (hypernatremia).  Eat foods that have minerals (electrolytes). Examples include bananas, oranges, potatoes, tomatoes, and spinach.  Keep all follow-up visits as told by your doctor. This is important. Contact a doctor if:  You have belly (abdominal) pain that:  Gets worse.  Stays in one area (localizes).  You have a rash.  You have a stiff neck.  You get angry or annoyed more easily than normal (irritability).  You are more sleepy than normal.  You have a harder time waking up than normal.  You feel:  Weak.  Dizzy.  Very thirsty.  You have peed (urinated) only a small amount of very dark pee during 6-8 hours. Get help right away if:  You   have symptoms of very bad dehydration.  You cannot drink fluids without throwing up (vomiting).  Your symptoms get worse with treatment.  You have a fever.  You have a very bad headache.  You are throwing up or having watery poop (diarrhea) and it:  Gets worse.  Does not go away.  You have blood or something green (bile) in your throw-up.  You have blood in your poop (stool). This may cause poop to look black and tarry.  You have not peed in 6-8 hours.  You pass out (faint).  Your heart rate when you are sitting still is more than 100 beats a  minute.  You have trouble breathing. This information is not intended to replace advice given to you by your health care provider. Make sure you discuss any questions you have with your health care provider. Document Released: 12/12/2008 Document Revised: 09/05/2015 Document Reviewed: 04/11/2015 Elsevier Interactive Patient Education  2017 [employer]  

## 2016-06-15 ENCOUNTER — Ambulatory Visit (HOSPITAL_BASED_OUTPATIENT_CLINIC_OR_DEPARTMENT_OTHER): Payer: Medicare Other

## 2016-06-15 ENCOUNTER — Other Ambulatory Visit: Payer: Self-pay | Admitting: Hematology and Oncology

## 2016-06-15 VITALS — BP 132/67 | HR 99 | Temp 98.5°F | Resp 16

## 2016-06-15 DIAGNOSIS — C7800 Secondary malignant neoplasm of unspecified lung: Secondary | ICD-10-CM

## 2016-06-15 DIAGNOSIS — C7951 Secondary malignant neoplasm of bone: Secondary | ICD-10-CM | POA: Diagnosis not present

## 2016-06-15 DIAGNOSIS — R112 Nausea with vomiting, unspecified: Secondary | ICD-10-CM

## 2016-06-15 DIAGNOSIS — M25551 Pain in right hip: Secondary | ICD-10-CM

## 2016-06-15 DIAGNOSIS — C786 Secondary malignant neoplasm of retroperitoneum and peritoneum: Secondary | ICD-10-CM

## 2016-06-15 DIAGNOSIS — Z95828 Presence of other vascular implants and grafts: Secondary | ICD-10-CM

## 2016-06-15 DIAGNOSIS — C801 Malignant (primary) neoplasm, unspecified: Secondary | ICD-10-CM

## 2016-06-15 DIAGNOSIS — C561 Malignant neoplasm of right ovary: Secondary | ICD-10-CM | POA: Diagnosis not present

## 2016-06-15 MED ORDER — SODIUM CHLORIDE 0.9% FLUSH
10.0000 mL | INTRAVENOUS | Status: DC | PRN
  Administered 2016-06-15: 10 mL
  Filled 2016-06-15: qty 10

## 2016-06-15 MED ORDER — ALTEPLASE 2 MG IJ SOLR
2.0000 mg | Freq: Once | INTRAMUSCULAR | Status: DC | PRN
  Filled 2016-06-15: qty 2

## 2016-06-15 MED ORDER — HYDROMORPHONE HCL 4 MG/ML IJ SOLN
1.0000 mg | INTRAMUSCULAR | Status: DC | PRN
  Administered 2016-06-15: 1 mg via INTRAVENOUS

## 2016-06-15 MED ORDER — ONDANSETRON HCL 4 MG/2ML IJ SOLN
8.0000 mg | Freq: Once | INTRAMUSCULAR | Status: AC
  Administered 2016-06-15: 8 mg via INTRAVENOUS

## 2016-06-15 MED ORDER — HEPARIN SOD (PORK) LOCK FLUSH 100 UNIT/ML IV SOLN
500.0000 [IU] | Freq: Once | INTRAVENOUS | Status: AC | PRN
  Administered 2016-06-15: 500 [IU]
  Filled 2016-06-15: qty 5

## 2016-06-15 MED ORDER — ONDANSETRON HCL 4 MG/2ML IJ SOLN
INTRAMUSCULAR | Status: AC
  Filled 2016-06-15: qty 4

## 2016-06-15 MED ORDER — SODIUM CHLORIDE 0.9 % IV SOLN
Freq: Once | INTRAVENOUS | Status: AC
  Administered 2016-06-15: 10:00:00 via INTRAVENOUS

## 2016-06-15 MED ORDER — HYDROMORPHONE HCL 4 MG PO TABS: 4.0000 mg | ORAL_TABLET | ORAL | 0 refills | Status: DC | PRN

## 2016-06-15 MED ORDER — HEPARIN SOD (PORK) LOCK FLUSH 100 UNIT/ML IV SOLN
250.0000 [IU] | Freq: Once | INTRAVENOUS | Status: DC | PRN
  Filled 2016-06-15: qty 5

## 2016-06-15 MED ORDER — HYDROMORPHONE HCL 4 MG/ML IJ SOLN
INTRAMUSCULAR | Status: AC
  Filled 2016-06-15: qty 1

## 2016-06-15 NOTE — Progress Notes (Signed)
Upon arrival to infusion appt, pt c/o increased nausea and vomiting over the weekend. She states "I have not been able to keep anything down." She also states that she is having right hip pain (see flowsheets). MD Alvy Bimler notified. Zofran and Dilauded given (see eMAR), pain medication dosage changed for at home refilled. Patient verbalizes understanding of how to take pain medication at home.

## 2016-06-15 NOTE — Patient Instructions (Signed)
Dehydration, Adult Dehydration is a condition in which there is not enough fluid or water in the body. This happens when you lose more fluids than you take in. Important organs, such as the kidneys, brain, and heart, cannot function without a proper amount of fluids. Any loss of fluids from the body can lead to dehydration. Dehydration can range from mild to severe. This condition should be treated right away to prevent it from becoming severe. What are the causes? This condition may be caused by:  Vomiting.  Diarrhea.  Excessive sweating, such as from heat exposure or exercise.  Not drinking enough fluid, especially:  When ill.  While doing activity that requires a lot of energy.  Excessive urination.  Fever.  Infection.  Certain medicines, such as medicines that cause the body to lose excess fluid (diuretics).  Inability to access safe drinking water.  Reduced physical ability to get adequate water and food. What increases the risk? This condition is more likely to develop in people:  Who have a poorly controlled long-term (chronic) illness, such as diabetes, heart disease, or kidney disease.  Who are age 65 or older.  Who are disabled.  Who live in a place with high altitude.  Who play endurance sports. What are the signs or symptoms? Symptoms of mild dehydration may include:   Thirst.  Dry lips.  Slightly dry mouth.  Dry, warm skin.  Dizziness. Symptoms of moderate dehydration may include:   Very dry mouth.  Muscle cramps.  Dark urine. Urine may be the color of tea.  Decreased urine production.  Decreased tear production.  Heartbeat that is irregular or faster than normal (palpitations).  Headache.  Light-headedness, especially when you stand up from a sitting position.  Fainting (syncope). Symptoms of severe dehydration may include:   Changes in skin, such as:  Cold and clammy skin.  Blotchy (mottled) or pale skin.  Skin that does  not quickly return to normal after being lightly pinched and released (poor skin turgor).  Changes in body fluids, such as:  Extreme thirst.  No tear production.  Inability to sweat when body temperature is high, such as in hot weather.  Very little urine production.  Changes in vital signs, such as:  Weak pulse.  Pulse that is more than 100 beats a minute when sitting still.  Rapid breathing.  Low blood pressure.  Other changes, such as:  Sunken eyes.  Cold hands and feet.  Confusion.  Lack of energy (lethargy).  Difficulty waking up from sleep.  Short-term weight loss.  Unconsciousness. How is this diagnosed? This condition is diagnosed based on your symptoms and a physical exam. Blood and urine tests may be done to help confirm the diagnosis. How is this treated? Treatment for this condition depends on the severity. Mild or moderate dehydration can often be treated at home. Treatment should be started right away. Do not wait until dehydration becomes severe. Severe dehydration is an emergency and it needs to be treated in a hospital. Treatment for mild dehydration may include:   Drinking more fluids.  Replacing salts and minerals in your blood (electrolytes) that you may have lost. Treatment for moderate dehydration may include:   Drinking an oral rehydration solution (ORS). This is a drink that helps you replace fluids and electrolytes (rehydrate). It can be found at pharmacies and retail stores. Treatment for severe dehydration may include:   Receiving fluids through an IV tube.  Receiving an electrolyte solution through a feeding tube that is   passed through your nose and into your stomach (nasogastric tube, or NG tube).  Correcting any abnormalities in electrolytes.  Treating the underlying cause of dehydration. Follow these instructions at home:  If directed by your health care provider, drink an ORS:  Make an ORS by following instructions on the  package.  Start by drinking small amounts, about  cup (120 mL) every 5-10 minutes.  Slowly increase how much you drink until you have taken the amount recommended by your health care provider.  Drink enough clear fluid to keep your urine clear or pale yellow. If you were told to drink an ORS, finish the ORS first, then start slowly drinking other clear fluids. Drink fluids such as:  Water. Do not drink only water. Doing that can lead to having too little salt (sodium) in the body (hyponatremia).  Ice chips.  Fruit juice that you have added water to (diluted fruit juice).  Low-calorie sports drinks.  Avoid:  Alcohol.  Drinks that contain a lot of sugar. These include high-calorie sports drinks, fruit juice that is not diluted, and soda.  Caffeine.  Foods that are greasy or contain a lot of fat or sugar.  Take over-the-counter and prescription medicines only as told by your health care provider.  Do not take sodium tablets. This can lead to having too much sodium in the body (hypernatremia).  Eat foods that contain a healthy balance of electrolytes, such as bananas, oranges, potatoes, tomatoes, and spinach.  Keep all follow-up visits as told by your health care provider. This is important. Contact a health care provider if:  You have abdominal pain that:  Gets worse.  Stays in one area (localizes).  You have a rash.  You have a stiff neck.  You are more irritable than usual.  You are sleepier or more difficult to wake up than usual.  You feel weak or dizzy.  You feel very thirsty.  You have urinated only a small amount of very dark urine over 6-8 hours. Get help right away if:  You have symptoms of severe dehydration.  You cannot drink fluids without vomiting.  Your symptoms get worse with treatment.  You have a fever.  You have a severe headache.  You have vomiting or diarrhea that:  Gets worse.  Does not go away.  You have blood or green matter  (bile) in your vomit.  You have blood in your stool. This may cause stool to look black and tarry.  You have not urinated in 6-8 hours.  You faint.  Your heart rate while sitting still is over 100 beats a minute.  You have trouble breathing. This information is not intended to replace advice given to you by your health care provider. Make sure you discuss any questions you have with your health care provider. Document Released: 02/15/2005 Document Revised: 09/12/2015 Document Reviewed: 04/11/2015 Elsevier Interactive Patient Education  2017 [employer]  

## 2016-06-16 ENCOUNTER — Ambulatory Visit (HOSPITAL_BASED_OUTPATIENT_CLINIC_OR_DEPARTMENT_OTHER): Payer: Medicare Other

## 2016-06-16 VITALS — BP 148/64 | HR 98 | Temp 97.7°F | Resp 16

## 2016-06-16 DIAGNOSIS — C786 Secondary malignant neoplasm of retroperitoneum and peritoneum: Secondary | ICD-10-CM

## 2016-06-16 DIAGNOSIS — Z95828 Presence of other vascular implants and grafts: Secondary | ICD-10-CM

## 2016-06-16 DIAGNOSIS — C7951 Secondary malignant neoplasm of bone: Secondary | ICD-10-CM

## 2016-06-16 DIAGNOSIS — C7800 Secondary malignant neoplasm of unspecified lung: Secondary | ICD-10-CM | POA: Diagnosis not present

## 2016-06-16 DIAGNOSIS — R112 Nausea with vomiting, unspecified: Secondary | ICD-10-CM

## 2016-06-16 DIAGNOSIS — C561 Malignant neoplasm of right ovary: Secondary | ICD-10-CM

## 2016-06-16 DIAGNOSIS — C801 Malignant (primary) neoplasm, unspecified: Secondary | ICD-10-CM

## 2016-06-16 MED ORDER — SODIUM CHLORIDE 0.9% FLUSH
10.0000 mL | INTRAVENOUS | Status: DC | PRN
  Administered 2016-06-16: 10 mL
  Filled 2016-06-16: qty 10

## 2016-06-16 MED ORDER — SODIUM CHLORIDE 0.9 % IV SOLN
Freq: Once | INTRAVENOUS | Status: AC
  Administered 2016-06-16: 10:00:00 via INTRAVENOUS

## 2016-06-16 MED ORDER — HYDROMORPHONE HCL 4 MG/ML IJ SOLN: 1.0000 mg | INTRAMUSCULAR | Status: DC | PRN

## 2016-06-16 MED ORDER — HEPARIN SOD (PORK) LOCK FLUSH 100 UNIT/ML IV SOLN
250.0000 [IU] | Freq: Once | INTRAVENOUS | Status: DC | PRN
  Filled 2016-06-16: qty 5

## 2016-06-16 MED ORDER — HYDROMORPHONE HCL 4 MG/ML IJ SOLN
INTRAMUSCULAR | Status: AC
  Filled 2016-06-16: qty 1

## 2016-06-16 MED ORDER — ALTEPLASE 2 MG IJ SOLR
2.0000 mg | Freq: Once | INTRAMUSCULAR | Status: DC | PRN
  Filled 2016-06-16: qty 2

## 2016-06-16 MED ORDER — HEPARIN SOD (PORK) LOCK FLUSH 100 UNIT/ML IV SOLN
500.0000 [IU] | Freq: Once | INTRAVENOUS | Status: AC | PRN
  Administered 2016-06-16: 500 [IU]
  Filled 2016-06-16: qty 5

## 2016-06-16 NOTE — Patient Instructions (Signed)
Dehydration, Adult Dehydration is a condition in which there is not enough fluid or water in the body. This happens when you lose more fluids than you take in. Important organs, such as the kidneys, brain, and heart, cannot function without a proper amount of fluids. Any loss of fluids from the body can lead to dehydration. Dehydration can range from mild to severe. This condition should be treated right away to prevent it from becoming severe. What are the causes? This condition may be caused by:  Vomiting.  Diarrhea.  Excessive sweating, such as from heat exposure or exercise.  Not drinking enough fluid, especially:  When ill.  While doing activity that requires a lot of energy.  Excessive urination.  Fever.  Infection.  Certain medicines, such as medicines that cause the body to lose excess fluid (diuretics).  Inability to access safe drinking water.  Reduced physical ability to get adequate water and food. What increases the risk? This condition is more likely to develop in people:  Who have a poorly controlled long-term (chronic) illness, such as diabetes, heart disease, or kidney disease.  Who are age 65 or older.  Who are disabled.  Who live in a place with high altitude.  Who play endurance sports. What are the signs or symptoms? Symptoms of mild dehydration may include:   Thirst.  Dry lips.  Slightly dry mouth.  Dry, warm skin.  Dizziness. Symptoms of moderate dehydration may include:   Very dry mouth.  Muscle cramps.  Dark urine. Urine may be the color of tea.  Decreased urine production.  Decreased tear production.  Heartbeat that is irregular or faster than normal (palpitations).  Headache.  Light-headedness, especially when you stand up from a sitting position.  Fainting (syncope). Symptoms of severe dehydration may include:   Changes in skin, such as:  Cold and clammy skin.  Blotchy (mottled) or pale skin.  Skin that does  not quickly return to normal after being lightly pinched and released (poor skin turgor).  Changes in body fluids, such as:  Extreme thirst.  No tear production.  Inability to sweat when body temperature is high, such as in hot weather.  Very little urine production.  Changes in vital signs, such as:  Weak pulse.  Pulse that is more than 100 beats a minute when sitting still.  Rapid breathing.  Low blood pressure.  Other changes, such as:  Sunken eyes.  Cold hands and feet.  Confusion.  Lack of energy (lethargy).  Difficulty waking up from sleep.  Short-term weight loss.  Unconsciousness. How is this diagnosed? This condition is diagnosed based on your symptoms and a physical exam. Blood and urine tests may be done to help confirm the diagnosis. How is this treated? Treatment for this condition depends on the severity. Mild or moderate dehydration can often be treated at home. Treatment should be started right away. Do not wait until dehydration becomes severe. Severe dehydration is an emergency and it needs to be treated in a hospital. Treatment for mild dehydration may include:   Drinking more fluids.  Replacing salts and minerals in your blood (electrolytes) that you may have lost. Treatment for moderate dehydration may include:   Drinking an oral rehydration solution (ORS). This is a drink that helps you replace fluids and electrolytes (rehydrate). It can be found at pharmacies and retail stores. Treatment for severe dehydration may include:   Receiving fluids through an IV tube.  Receiving an electrolyte solution through a feeding tube that is   passed through your nose and into your stomach (nasogastric tube, or NG tube).  Correcting any abnormalities in electrolytes.  Treating the underlying cause of dehydration. Follow these instructions at home:  If directed by your health care provider, drink an ORS:  Make an ORS by following instructions on the  package.  Start by drinking small amounts, about  cup (120 mL) every 5-10 minutes.  Slowly increase how much you drink until you have taken the amount recommended by your health care provider.  Drink enough clear fluid to keep your urine clear or pale yellow. If you were told to drink an ORS, finish the ORS first, then start slowly drinking other clear fluids. Drink fluids such as:  Water. Do not drink only water. Doing that can lead to having too little salt (sodium) in the body (hyponatremia).  Ice chips.  Fruit juice that you have added water to (diluted fruit juice).  Low-calorie sports drinks.  Avoid:  Alcohol.  Drinks that contain a lot of sugar. These include high-calorie sports drinks, fruit juice that is not diluted, and soda.  Caffeine.  Foods that are greasy or contain a lot of fat or sugar.  Take over-the-counter and prescription medicines only as told by your health care provider.  Do not take sodium tablets. This can lead to having too much sodium in the body (hypernatremia).  Eat foods that contain a healthy balance of electrolytes, such as bananas, oranges, potatoes, tomatoes, and spinach.  Keep all follow-up visits as told by your health care provider. This is important. Contact a health care provider if:  You have abdominal pain that:  Gets worse.  Stays in one area (localizes).  You have a rash.  You have a stiff neck.  You are more irritable than usual.  You are sleepier or more difficult to wake up than usual.  You feel weak or dizzy.  You feel very thirsty.  You have urinated only a small amount of very dark urine over 6-8 hours. Get help right away if:  You have symptoms of severe dehydration.  You cannot drink fluids without vomiting.  Your symptoms get worse with treatment.  You have a fever.  You have a severe headache.  You have vomiting or diarrhea that:  Gets worse.  Does not go away.  You have blood or green matter  (bile) in your vomit.  You have blood in your stool. This may cause stool to look black and tarry.  You have not urinated in 6-8 hours.  You faint.  Your heart rate while sitting still is over 100 beats a minute.  You have trouble breathing. This information is not intended to replace advice given to you by your health care provider. Make sure you discuss any questions you have with your health care provider. Document Released: 02/15/2005 Document Revised: 09/12/2015 Document Reviewed: 04/11/2015 Elsevier Interactive Patient Education  2017 [employer]  

## 2016-06-17 ENCOUNTER — Ambulatory Visit (HOSPITAL_BASED_OUTPATIENT_CLINIC_OR_DEPARTMENT_OTHER): Payer: Medicare Other

## 2016-06-17 VITALS — BP 146/75 | HR 94 | Temp 98.9°F | Resp 16

## 2016-06-17 DIAGNOSIS — C786 Secondary malignant neoplasm of retroperitoneum and peritoneum: Secondary | ICD-10-CM

## 2016-06-17 DIAGNOSIS — C561 Malignant neoplasm of right ovary: Secondary | ICD-10-CM | POA: Diagnosis not present

## 2016-06-17 DIAGNOSIS — E43 Unspecified severe protein-calorie malnutrition: Secondary | ICD-10-CM

## 2016-06-17 DIAGNOSIS — C7800 Secondary malignant neoplasm of unspecified lung: Secondary | ICD-10-CM

## 2016-06-17 DIAGNOSIS — R110 Nausea: Secondary | ICD-10-CM

## 2016-06-17 DIAGNOSIS — C7951 Secondary malignant neoplasm of bone: Secondary | ICD-10-CM | POA: Diagnosis not present

## 2016-06-17 DIAGNOSIS — Z95828 Presence of other vascular implants and grafts: Secondary | ICD-10-CM

## 2016-06-17 DIAGNOSIS — C801 Malignant (primary) neoplasm, unspecified: Secondary | ICD-10-CM

## 2016-06-17 MED ORDER — HEPARIN SOD (PORK) LOCK FLUSH 100 UNIT/ML IV SOLN
250.0000 [IU] | Freq: Once | INTRAVENOUS | Status: DC | PRN
  Filled 2016-06-17: qty 5

## 2016-06-17 MED ORDER — SODIUM CHLORIDE 0.9% FLUSH
10.0000 mL | INTRAVENOUS | Status: DC | PRN
  Administered 2016-06-17: 10 mL
  Filled 2016-06-17: qty 10

## 2016-06-17 MED ORDER — HEPARIN SOD (PORK) LOCK FLUSH 100 UNIT/ML IV SOLN
500.0000 [IU] | Freq: Once | INTRAVENOUS | Status: AC | PRN
  Administered 2016-06-17: 500 [IU]
  Filled 2016-06-17: qty 5

## 2016-06-17 MED ORDER — HYDROMORPHONE HCL 4 MG/ML IJ SOLN
INTRAMUSCULAR | Status: AC
  Filled 2016-06-17: qty 1

## 2016-06-17 MED ORDER — SODIUM CHLORIDE 0.9 % IV SOLN
Freq: Once | INTRAVENOUS | Status: AC
  Administered 2016-06-17: 10:00:00 via INTRAVENOUS

## 2016-06-17 MED ORDER — HYDROMORPHONE HCL 4 MG/ML IJ SOLN
1.0000 mg | INTRAMUSCULAR | Status: DC | PRN
  Administered 2016-06-17: 1 mg via INTRAVENOUS

## 2016-06-17 MED ORDER — ALTEPLASE 2 MG IJ SOLR
2.0000 mg | Freq: Once | INTRAMUSCULAR | Status: DC | PRN
Start: 2016-06-17 — End: 2016-06-17
  Filled 2016-06-17: qty 2

## 2016-06-17 NOTE — Patient Instructions (Signed)
Dehydration, Adult Dehydration is a condition in which there is not enough fluid or water in the body. This happens when you lose more fluids than you take in. Important organs, such as the kidneys, brain, and heart, cannot function without a proper amount of fluids. Any loss of fluids from the body can lead to dehydration. Dehydration can range from mild to severe. This condition should be treated right away to prevent it from becoming severe. What are the causes? This condition may be caused by:  Vomiting.  Diarrhea.  Excessive sweating, such as from heat exposure or exercise.  Not drinking enough fluid, especially:  When ill.  While doing activity that requires a lot of energy.  Excessive urination.  Fever.  Infection.  Certain medicines, such as medicines that cause the body to lose excess fluid (diuretics).  Inability to access safe drinking water.  Reduced physical ability to get adequate water and food. What increases the risk? This condition is more likely to develop in people:  Who have a poorly controlled long-term (chronic) illness, such as diabetes, heart disease, or kidney disease.  Who are age 65 or older.  Who are disabled.  Who live in a place with high altitude.  Who play endurance sports. What are the signs or symptoms? Symptoms of mild dehydration may include:   Thirst.  Dry lips.  Slightly dry mouth.  Dry, warm skin.  Dizziness. Symptoms of moderate dehydration may include:   Very dry mouth.  Muscle cramps.  Dark urine. Urine may be the color of tea.  Decreased urine production.  Decreased tear production.  Heartbeat that is irregular or faster than normal (palpitations).  Headache.  Light-headedness, especially when you stand up from a sitting position.  Fainting (syncope). Symptoms of severe dehydration may include:   Changes in skin, such as:  Cold and clammy skin.  Blotchy (mottled) or pale skin.  Skin that does  not quickly return to normal after being lightly pinched and released (poor skin turgor).  Changes in body fluids, such as:  Extreme thirst.  No tear production.  Inability to sweat when body temperature is high, such as in hot weather.  Very little urine production.  Changes in vital signs, such as:  Weak pulse.  Pulse that is more than 100 beats a minute when sitting still.  Rapid breathing.  Low blood pressure.  Other changes, such as:  Sunken eyes.  Cold hands and feet.  Confusion.  Lack of energy (lethargy).  Difficulty waking up from sleep.  Short-term weight loss.  Unconsciousness. How is this diagnosed? This condition is diagnosed based on your symptoms and a physical exam. Blood and urine tests may be done to help confirm the diagnosis. How is this treated? Treatment for this condition depends on the severity. Mild or moderate dehydration can often be treated at home. Treatment should be started right away. Do not wait until dehydration becomes severe. Severe dehydration is an emergency and it needs to be treated in a hospital. Treatment for mild dehydration may include:   Drinking more fluids.  Replacing salts and minerals in your blood (electrolytes) that you may have lost. Treatment for moderate dehydration may include:   Drinking an oral rehydration solution (ORS). This is a drink that helps you replace fluids and electrolytes (rehydrate). It can be found at pharmacies and retail stores. Treatment for severe dehydration may include:   Receiving fluids through an IV tube.  Receiving an electrolyte solution through a feeding tube that is   passed through your nose and into your stomach (nasogastric tube, or NG tube).  Correcting any abnormalities in electrolytes.  Treating the underlying cause of dehydration. Follow these instructions at home:  If directed by your health care provider, drink an ORS:  Make an ORS by following instructions on the  package.  Start by drinking small amounts, about  cup (120 mL) every 5-10 minutes.  Slowly increase how much you drink until you have taken the amount recommended by your health care provider.  Drink enough clear fluid to keep your urine clear or pale yellow. If you were told to drink an ORS, finish the ORS first, then start slowly drinking other clear fluids. Drink fluids such as:  Water. Do not drink only water. Doing that can lead to having too little salt (sodium) in the body (hyponatremia).  Ice chips.  Fruit juice that you have added water to (diluted fruit juice).  Low-calorie sports drinks.  Avoid:  Alcohol.  Drinks that contain a lot of sugar. These include high-calorie sports drinks, fruit juice that is not diluted, and soda.  Caffeine.  Foods that are greasy or contain a lot of fat or sugar.  Take over-the-counter and prescription medicines only as told by your health care provider.  Do not take sodium tablets. This can lead to having too much sodium in the body (hypernatremia).  Eat foods that contain a healthy balance of electrolytes, such as bananas, oranges, potatoes, tomatoes, and spinach.  Keep all follow-up visits as told by your health care provider. This is important. Contact a health care provider if:  You have abdominal pain that:  Gets worse.  Stays in one area (localizes).  You have a rash.  You have a stiff neck.  You are more irritable than usual.  You are sleepier or more difficult to wake up than usual.  You feel weak or dizzy.  You feel very thirsty.  You have urinated only a small amount of very dark urine over 6-8 hours. Get help right away if:  You have symptoms of severe dehydration.  You cannot drink fluids without vomiting.  Your symptoms get worse with treatment.  You have a fever.  You have a severe headache.  You have vomiting or diarrhea that:  Gets worse.  Does not go away.  You have blood or green matter  (bile) in your vomit.  You have blood in your stool. This may cause stool to look black and tarry.  You have not urinated in 6-8 hours.  You faint.  Your heart rate while sitting still is over 100 beats a minute.  You have trouble breathing. This information is not intended to replace advice given to you by your health care provider. Make sure you discuss any questions you have with your health care provider. Document Released: 02/15/2005 Document Revised: 09/12/2015 Document Reviewed: 04/11/2015 Elsevier Interactive Patient Education  2017 [employer]  

## 2016-06-18 ENCOUNTER — Telehealth: Payer: Self-pay | Admitting: Hematology and Oncology

## 2016-06-18 ENCOUNTER — Ambulatory Visit (HOSPITAL_BASED_OUTPATIENT_CLINIC_OR_DEPARTMENT_OTHER): Payer: Medicare Other

## 2016-06-18 ENCOUNTER — Inpatient Hospital Stay (HOSPITAL_COMMUNITY)
Admission: AD | Admit: 2016-06-18 | Discharge: 2016-06-30 | DRG: 754 | Disposition: A | Discharge status: Hospice | Payer: Medicare Other | Source: Ambulatory Visit | Attending: Hematology and Oncology | Admitting: Hematology and Oncology

## 2016-06-18 ENCOUNTER — Ambulatory Visit (HOSPITAL_COMMUNITY)
Admission: RE | Admit: 2016-06-18 | Discharge: 2016-06-18 | Disposition: A | Discharge status: Home / Self Care | Payer: Medicare Other | Source: Ambulatory Visit | Attending: Hematology and Oncology | Admitting: Hematology and Oncology

## 2016-06-18 ENCOUNTER — Other Ambulatory Visit: Payer: Self-pay | Admitting: Hematology and Oncology

## 2016-06-18 VITALS — BP 133/75 | HR 88 | Temp 98.6°F | Resp 16

## 2016-06-18 DIAGNOSIS — Z6821 Body mass index (BMI) 21.0-21.9, adult: Secondary | ICD-10-CM

## 2016-06-18 DIAGNOSIS — E876 Hypokalemia: Secondary | ICD-10-CM

## 2016-06-18 DIAGNOSIS — C786 Secondary malignant neoplasm of retroperitoneum and peritoneum: Secondary | ICD-10-CM

## 2016-06-18 DIAGNOSIS — C801 Malignant (primary) neoplasm, unspecified: Secondary | ICD-10-CM | POA: Diagnosis not present

## 2016-06-18 DIAGNOSIS — Z0189 Encounter for other specified special examinations: Secondary | ICD-10-CM

## 2016-06-18 DIAGNOSIS — I10 Essential (primary) hypertension: Secondary | ICD-10-CM | POA: Diagnosis not present

## 2016-06-18 DIAGNOSIS — Z90710 Acquired absence of both cervix and uterus: Secondary | ICD-10-CM

## 2016-06-18 DIAGNOSIS — Z515 Encounter for palliative care: Secondary | ICD-10-CM | POA: Diagnosis present

## 2016-06-18 DIAGNOSIS — K5900 Constipation, unspecified: Secondary | ICD-10-CM | POA: Diagnosis not present

## 2016-06-18 DIAGNOSIS — K9423 Gastrostomy malfunction: Secondary | ICD-10-CM | POA: Diagnosis not present

## 2016-06-18 DIAGNOSIS — E785 Hyperlipidemia, unspecified: Secondary | ICD-10-CM | POA: Diagnosis not present

## 2016-06-18 DIAGNOSIS — R410 Disorientation, unspecified: Secondary | ICD-10-CM | POA: Diagnosis not present

## 2016-06-18 DIAGNOSIS — Z66 Do not resuscitate: Secondary | ICD-10-CM | POA: Diagnosis present

## 2016-06-18 DIAGNOSIS — R339 Retention of urine, unspecified: Secondary | ICD-10-CM | POA: Diagnosis not present

## 2016-06-18 DIAGNOSIS — Z923 Personal history of irradiation: Secondary | ICD-10-CM

## 2016-06-18 DIAGNOSIS — T451X5A Adverse effect of antineoplastic and immunosuppressive drugs, initial encounter: Secondary | ICD-10-CM | POA: Diagnosis present

## 2016-06-18 DIAGNOSIS — G893 Neoplasm related pain (acute) (chronic): Secondary | ICD-10-CM | POA: Diagnosis not present

## 2016-06-18 DIAGNOSIS — T17920A Food in respiratory tract, part unspecified causing asphyxiation, initial encounter: Secondary | ICD-10-CM | POA: Diagnosis not present

## 2016-06-18 DIAGNOSIS — E860 Dehydration: Secondary | ICD-10-CM

## 2016-06-18 DIAGNOSIS — C561 Malignant neoplasm of right ovary: Secondary | ICD-10-CM

## 2016-06-18 DIAGNOSIS — D6481 Anemia due to antineoplastic chemotherapy: Secondary | ICD-10-CM | POA: Diagnosis not present

## 2016-06-18 DIAGNOSIS — T17908A Unspecified foreign body in respiratory tract, part unspecified causing other injury, initial encounter: Secondary | ICD-10-CM

## 2016-06-18 DIAGNOSIS — C7800 Secondary malignant neoplasm of unspecified lung: Secondary | ICD-10-CM

## 2016-06-18 DIAGNOSIS — R110 Nausea: Secondary | ICD-10-CM | POA: Diagnosis not present

## 2016-06-18 DIAGNOSIS — K219 Gastro-esophageal reflux disease without esophagitis: Secondary | ICD-10-CM

## 2016-06-18 DIAGNOSIS — C7951 Secondary malignant neoplasm of bone: Secondary | ICD-10-CM

## 2016-06-18 DIAGNOSIS — E119 Type 2 diabetes mellitus without complications: Secondary | ICD-10-CM | POA: Diagnosis present

## 2016-06-18 DIAGNOSIS — Z79899 Other long term (current) drug therapy: Secondary | ICD-10-CM

## 2016-06-18 DIAGNOSIS — R0602 Shortness of breath: Secondary | ICD-10-CM | POA: Diagnosis not present

## 2016-06-18 DIAGNOSIS — C482 Malignant neoplasm of peritoneum, unspecified: Secondary | ICD-10-CM

## 2016-06-18 DIAGNOSIS — K3189 Other diseases of stomach and duodenum: Secondary | ICD-10-CM | POA: Diagnosis not present

## 2016-06-18 DIAGNOSIS — K209 Esophagitis, unspecified: Secondary | ICD-10-CM | POA: Diagnosis present

## 2016-06-18 DIAGNOSIS — Z87891 Personal history of nicotine dependence: Secondary | ICD-10-CM

## 2016-06-18 DIAGNOSIS — R64 Cachexia: Secondary | ICD-10-CM

## 2016-06-18 DIAGNOSIS — D638 Anemia in other chronic diseases classified elsewhere: Secondary | ICD-10-CM | POA: Diagnosis present

## 2016-06-18 DIAGNOSIS — E43 Unspecified severe protein-calorie malnutrition: Secondary | ICD-10-CM | POA: Diagnosis not present

## 2016-06-18 DIAGNOSIS — Z7189 Other specified counseling: Secondary | ICD-10-CM | POA: Diagnosis not present

## 2016-06-18 DIAGNOSIS — R079 Chest pain, unspecified: Secondary | ICD-10-CM | POA: Diagnosis not present

## 2016-06-18 DIAGNOSIS — Z4682 Encounter for fitting and adjustment of non-vascular catheter: Secondary | ICD-10-CM | POA: Diagnosis not present

## 2016-06-18 DIAGNOSIS — Z885 Allergy status to narcotic agent status: Secondary | ICD-10-CM

## 2016-06-18 DIAGNOSIS — R4182 Altered mental status, unspecified: Secondary | ICD-10-CM | POA: Diagnosis not present

## 2016-06-18 DIAGNOSIS — C569 Malignant neoplasm of unspecified ovary: Secondary | ICD-10-CM

## 2016-06-18 DIAGNOSIS — Z794 Long term (current) use of insulin: Secondary | ICD-10-CM

## 2016-06-18 DIAGNOSIS — Z95828 Presence of other vascular implants and grafts: Secondary | ICD-10-CM

## 2016-06-18 DIAGNOSIS — E878 Other disorders of electrolyte and fluid balance, not elsewhere classified: Secondary | ICD-10-CM | POA: Diagnosis not present

## 2016-06-18 DIAGNOSIS — Z9221 Personal history of antineoplastic chemotherapy: Secondary | ICD-10-CM

## 2016-06-18 DIAGNOSIS — Z4659 Encounter for fitting and adjustment of other gastrointestinal appliance and device: Secondary | ICD-10-CM

## 2016-06-18 LAB — CBC WITH DIFFERENTIAL/PLATELET
BASO%: 0.1 % (ref 0.0–2.0)
BASOS ABS: 0 10*3/uL (ref 0.0–0.1)
EOS ABS: 0 10*3/uL (ref 0.0–0.5)
EOS%: 0.3 % (ref 0.0–7.0)
HEMATOCRIT: 21 % — AB (ref 34.8–46.6)
HEMOGLOBIN: 6.5 g/dL — AB (ref 11.6–15.9)
LYMPH%: 3.3 % — ABNORMAL LOW (ref 14.0–49.7)
MCH: 23.8 pg — AB (ref 25.1–34.0)
MCHC: 31.1 g/dL — ABNORMAL LOW (ref 31.5–36.0)
MCV: 76.3 fL — AB (ref 79.5–101.0)
MONO#: 0.9 10*3/uL (ref 0.1–0.9)
MONO%: 6.3 % (ref 0.0–14.0)
NEUT#: 13 10*3/uL — ABNORMAL HIGH (ref 1.5–6.5)
NEUT%: 90 % — ABNORMAL HIGH (ref 38.4–76.8)
Platelets: 202 10*3/uL (ref 145–400)
RBC: 2.75 10*6/uL — ABNORMAL LOW (ref 3.70–5.45)
RDW: 15.1 % — AB (ref 11.2–14.5)
WBC: 14.4 10*3/uL — ABNORMAL HIGH (ref 3.9–10.3)
lymph#: 0.5 10*3/uL — ABNORMAL LOW (ref 0.9–3.3)

## 2016-06-18 LAB — URINALYSIS, MICROSCOPIC - CHCC
BLOOD: NEGATIVE
Bilirubin (Urine): NEGATIVE
GLUCOSE UR CHCC: NEGATIVE mg/dL
Ketones: 40 mg/dL
LEUKOCYTE ESTERASE: NEGATIVE
NITRITE: NEGATIVE
PH: 6 (ref 4.6–8.0)
Protein: 30 mg/dL
RBC / HPF: NEGATIVE (ref 0–2)
Specific Gravity, Urine: 1.03 (ref 1.003–1.035)
UROBILINOGEN UR: 0.2 mg/dL (ref 0.2–1)

## 2016-06-18 LAB — COMPREHENSIVE METABOLIC PANEL
ALBUMIN: 2.1 g/dL — AB (ref 3.5–5.0)
ALK PHOS: 110 U/L (ref 40–150)
ALT: 22 U/L (ref 0–55)
ANION GAP: 11 meq/L (ref 3–11)
AST: 33 U/L (ref 5–34)
BUN: 14.4 mg/dL (ref 7.0–26.0)
CALCIUM: 9.5 mg/dL (ref 8.4–10.4)
CO2: 26 mEq/L (ref 22–29)
Chloride: 107 mEq/L (ref 98–109)
Creatinine: 0.6 mg/dL (ref 0.6–1.1)
Glucose: 231 mg/dl — ABNORMAL HIGH (ref 70–140)
POTASSIUM: 3.4 meq/L — AB (ref 3.5–5.1)
SODIUM: 144 meq/L (ref 136–145)
Total Bilirubin: 0.79 mg/dL (ref 0.20–1.20)
Total Protein: 5.8 g/dL — ABNORMAL LOW (ref 6.4–8.3)

## 2016-06-18 LAB — PREPARE RBC (CROSSMATCH)

## 2016-06-18 LAB — GLUCOSE, CAPILLARY: Glucose-Capillary: 159 mg/dL — ABNORMAL HIGH (ref 65–99)

## 2016-06-18 MED ORDER — ONDANSETRON HCL 4 MG PO TABS: 4.0000 mg | ORAL_TABLET | Freq: Three times a day (TID) | ORAL | Status: DC | PRN

## 2016-06-18 MED ORDER — LORATADINE 10 MG PO TABS: 10.0000 mg | ORAL_TABLET | Freq: Every day | ORAL | Status: DC | PRN

## 2016-06-18 MED ORDER — MORPHINE SULFATE (PF) 4 MG/ML IV SOLN
1.0000 mg | INTRAVENOUS | Status: DC | PRN
  Administered 2016-06-21 – 2016-06-30 (×38): 1 mg via INTRAVENOUS
  Filled 2016-06-18 (×39): qty 1

## 2016-06-18 MED ORDER — ACETAMINOPHEN 325 MG PO TABS
650.0000 mg | ORAL_TABLET | Freq: Once | ORAL | Status: AC
  Administered 2016-06-18: 650 mg via ORAL

## 2016-06-18 MED ORDER — DRONABINOL 2.5 MG PO CAPS
2.5000 mg | ORAL_CAPSULE | Freq: Two times a day (BID) | ORAL | Status: DC
  Administered 2016-06-19 – 2016-06-22 (×5): 2.5 mg via ORAL
  Filled 2016-06-18 (×5): qty 1

## 2016-06-18 MED ORDER — ONDANSETRON HCL 4 MG/2ML IJ SOLN
4.0000 mg | Freq: Three times a day (TID) | INTRAMUSCULAR | Status: DC | PRN
  Administered 2016-06-20 – 2016-06-28 (×6): 4 mg via INTRAVENOUS
  Filled 2016-06-18 (×6): qty 2

## 2016-06-18 MED ORDER — DEXAMETHASONE SODIUM PHOSPHATE 4 MG/ML IJ SOLN
4.0000 mg | Freq: Every day | INTRAMUSCULAR | Status: DC
  Administered 2016-06-18 – 2016-06-28 (×11): 4 mg via INTRAVENOUS
  Filled 2016-06-18 (×12): qty 1

## 2016-06-18 MED ORDER — MELATONIN 3 MG PO CAPS: 3.0000 mg | ORAL_CAPSULE | Freq: Every evening | ORAL | Status: DC | PRN

## 2016-06-18 MED ORDER — INSULIN ASPART 100 UNIT/ML ~~LOC~~ SOLN
0.0000 [IU] | Freq: Three times a day (TID) | SUBCUTANEOUS | Status: DC
  Administered 2016-06-18: 2 [IU] via SUBCUTANEOUS
  Administered 2016-06-19: 4 [IU] via SUBCUTANEOUS
  Administered 2016-06-19: 8 [IU] via SUBCUTANEOUS
  Administered 2016-06-19 – 2016-06-20 (×2): 4 [IU] via SUBCUTANEOUS
  Administered 2016-06-20: 2 [IU] via SUBCUTANEOUS
  Administered 2016-06-20 – 2016-06-21 (×3): 4 [IU] via SUBCUTANEOUS
  Administered 2016-06-22: 2 [IU] via SUBCUTANEOUS
  Administered 2016-06-22: 16 [IU] via SUBCUTANEOUS
  Administered 2016-06-22: 4 [IU] via SUBCUTANEOUS
  Administered 2016-06-23: 8 [IU] via SUBCUTANEOUS
  Administered 2016-06-23: 12 [IU] via SUBCUTANEOUS
  Administered 2016-06-23: 8 [IU] via SUBCUTANEOUS
  Administered 2016-06-24 (×3): 12 [IU] via SUBCUTANEOUS
  Administered 2016-06-25: 16 [IU] via SUBCUTANEOUS
  Administered 2016-06-25 (×2): 12 [IU] via SUBCUTANEOUS
  Administered 2016-06-26: 8 [IU] via SUBCUTANEOUS
  Administered 2016-06-26: 12 [IU] via SUBCUTANEOUS
  Administered 2016-06-26 – 2016-06-27 (×3): 8 [IU] via SUBCUTANEOUS
  Administered 2016-06-27: 16 [IU] via SUBCUTANEOUS
  Administered 2016-06-28: 8 [IU] via SUBCUTANEOUS
  Administered 2016-06-28: 2 [IU] via SUBCUTANEOUS
  Administered 2016-06-29: 16 [IU] via SUBCUTANEOUS

## 2016-06-18 MED ORDER — DIPHENHYDRAMINE HCL 25 MG PO CAPS
25.0000 mg | ORAL_CAPSULE | Freq: Once | ORAL | Status: AC
  Administered 2016-06-18: 25 mg via ORAL

## 2016-06-18 MED ORDER — ENSURE ENLIVE PO LIQD: 237.0000 mL | Freq: Two times a day (BID) | ORAL | Status: DC

## 2016-06-18 MED ORDER — SENNOSIDES-DOCUSATE SODIUM 8.6-50 MG PO TABS
1.0000 | ORAL_TABLET | Freq: Two times a day (BID) | ORAL | Status: DC
  Administered 2016-06-19 – 2016-06-20 (×2): 1 via ORAL
  Filled 2016-06-18 (×4): qty 1

## 2016-06-18 MED ORDER — ONDANSETRON 4 MG PO TBDP: 8.0000 mg | ORAL_TABLET | Freq: Three times a day (TID) | ORAL | Status: DC | PRN

## 2016-06-18 MED ORDER — DIPHENHYDRAMINE HCL 25 MG PO CAPS
ORAL_CAPSULE | ORAL | Status: AC
  Filled 2016-06-18: qty 1

## 2016-06-18 MED ORDER — ENOXAPARIN SODIUM 40 MG/0.4ML ~~LOC~~ SOLN
40.0000 mg | SUBCUTANEOUS | Status: DC
  Administered 2016-06-18 – 2016-06-25 (×8): 40 mg via SUBCUTANEOUS
  Filled 2016-06-18 (×11): qty 0.4

## 2016-06-18 MED ORDER — ADULT MULTIVITAMIN W/MINERALS CH
1.0000 | ORAL_TABLET | Freq: Every day | ORAL | Status: DC
  Filled 2016-06-18: qty 1

## 2016-06-18 MED ORDER — ONDANSETRON 4 MG PO TBDP: 4.0000 mg | ORAL_TABLET | Freq: Three times a day (TID) | ORAL | Status: DC | PRN

## 2016-06-18 MED ORDER — HYDROMORPHONE HCL 4 MG PO TABS
4.0000 mg | ORAL_TABLET | ORAL | Status: DC | PRN
  Administered 2016-06-19 – 2016-06-20 (×4): 4 mg via ORAL
  Filled 2016-06-18 (×4): qty 1

## 2016-06-18 MED ORDER — MIRTAZAPINE 15 MG PO TABS
15.0000 mg | ORAL_TABLET | Freq: Every day | ORAL | Status: DC
  Administered 2016-06-19 – 2016-06-20 (×2): 15 mg via ORAL
  Filled 2016-06-18 (×2): qty 1

## 2016-06-18 MED ORDER — ONDANSETRON HCL 40 MG/20ML IJ SOLN
8.0000 mg | Freq: Three times a day (TID) | INTRAMUSCULAR | Status: DC | PRN
  Filled 2016-06-18: qty 4

## 2016-06-18 MED ORDER — ACETAMINOPHEN 325 MG PO TABS
650.0000 mg | ORAL_TABLET | ORAL | Status: DC | PRN
  Administered 2016-06-19: 650 mg via ORAL
  Filled 2016-06-18: qty 2

## 2016-06-18 MED ORDER — HYDROMORPHONE HCL 4 MG/ML IJ SOLN
INTRAMUSCULAR | Status: AC
  Filled 2016-06-18: qty 1

## 2016-06-18 MED ORDER — SODIUM CHLORIDE 0.9% FLUSH
10.0000 mL | INTRAVENOUS | Status: DC | PRN
  Filled 2016-06-18: qty 10

## 2016-06-18 MED ORDER — HYDROMORPHONE HCL 4 MG/ML IJ SOLN
1.0000 mg | INTRAMUSCULAR | Status: DC | PRN
  Administered 2016-06-18: 1 mg via INTRAVENOUS

## 2016-06-18 MED ORDER — MAGNESIUM HYDROXIDE 400 MG/5ML PO SUSP: 30.0000 mL | Freq: Every day | ORAL | Status: DC | PRN

## 2016-06-18 MED ORDER — ONDANSETRON HCL 4 MG/2ML IJ SOLN
8.0000 mg | Freq: Once | INTRAMUSCULAR | Status: AC
  Administered 2016-06-18: 8 mg via INTRAVENOUS

## 2016-06-18 MED ORDER — POLYETHYLENE GLYCOL 3350 17 G PO PACK: 17.0000 g | PACK | Freq: Every day | ORAL | Status: DC | PRN

## 2016-06-18 MED ORDER — ACETAMINOPHEN 325 MG PO TABS
ORAL_TABLET | ORAL | Status: AC
  Filled 2016-06-18: qty 2

## 2016-06-18 MED ORDER — SODIUM CHLORIDE 0.9 % IV SOLN
Freq: Once | INTRAVENOUS | Status: AC
  Administered 2016-06-18: 21:00:00 via INTRAVENOUS

## 2016-06-18 MED ORDER — ONDANSETRON HCL 4 MG/2ML IJ SOLN
INTRAMUSCULAR | Status: AC
  Filled 2016-06-18: qty 4

## 2016-06-18 MED ORDER — SODIUM CHLORIDE 0.9 % IV SOLN
250.0000 mL | Freq: Once | INTRAVENOUS | Status: DC
Start: 2016-06-18 — End: 2016-06-18

## 2016-06-18 MED ORDER — SODIUM CHLORIDE 0.9 % IV SOLN
INTRAVENOUS | Status: DC
  Administered 2016-06-18: 21:00:00 via INTRAVENOUS
  Administered 2016-06-19 – 2016-06-20 (×2): 1000 mL via INTRAVENOUS
  Administered 2016-06-22: 22:00:00 via INTRAVENOUS
  Administered 2016-06-23: 1000 mL via INTRAVENOUS
  Administered 2016-06-25 – 2016-06-28 (×4): via INTRAVENOUS

## 2016-06-18 MED ORDER — HEPARIN SOD (PORK) LOCK FLUSH 100 UNIT/ML IV SOLN
500.0000 [IU] | Freq: Once | INTRAVENOUS | Status: DC | PRN
  Filled 2016-06-18: qty 5

## 2016-06-18 MED ORDER — SODIUM CHLORIDE 0.9 % IV SOLN
Freq: Once | INTRAVENOUS | Status: AC
  Administered 2016-06-18: 10:00:00 via INTRAVENOUS

## 2016-06-18 MED ORDER — LORAZEPAM 0.5 MG PO TABS
0.5000 mg | ORAL_TABLET | Freq: Four times a day (QID) | ORAL | Status: DC | PRN
  Administered 2016-06-19 – 2016-06-20 (×3): 0.5 mg via ORAL
  Filled 2016-06-18 (×3): qty 1

## 2016-06-18 MED ORDER — LIDOCAINE-PRILOCAINE 2.5-2.5 % EX CREA: TOPICAL_CREAM | Freq: Once | CUTANEOUS | Status: DC

## 2016-06-18 MED ORDER — HEPARIN SOD (PORK) LOCK FLUSH 100 UNIT/ML IV SOLN
500.0000 [IU] | Freq: Every day | INTRAVENOUS | Status: DC | PRN
  Filled 2016-06-18: qty 5

## 2016-06-18 NOTE — Progress Notes (Signed)
Blood transfusion complete Vitals to chart. Patient resting with eyes closed. Family at bedside all questions answered. Will continue to monitor.

## 2016-06-18 NOTE — Progress Notes (Signed)
1545 - Report called to [facility]. Patient will be taken to 1322 via wheelchair. 1 unit of PRBC's will be returned to blood bank per blood bank request.

## 2016-06-18 NOTE — H&P (Signed)
[place] ADMISSION NOTE  Patient Care Team: Hoyt Koch, MD as PCP - General (Internal Medicine) Fredderick Erb, MD as Consulting Physician (Ophthalmology)  CHIEF COMPLAINTS/PURPOSE OF ADMISSION Management for severe anemia, dehydration, malignant hypercalcemia, with recurrent metastatic ovarian cancer  HISTORY OF PRESENTING ILLNESS:  Leslie Rowe 81 y.o. female is admitted for management of all symptoms above Summary of oncologic history as follows:   Primary peritoneal carcinomatosis ([place])   11/14/2015 Initial Diagnosis    Primary peritoneal carcinomatosis ([place]). Stage IV based on pulmonary nodules.       Ovarian cancer on right [facility])   11/07/2015 Imaging    Multiple pulmonary nodules bilaterally compatible with pulmonary metastases. Peritoneal carcinomatosis with tumor deposits throughout omentum and at the pericolic gutters as well as within the pelvis. BILATERAL cystic and solid ovarian masses, predominately cystic on LEFT and predominately solid on RIGHT, question ovarian neoplasms versus tumor deposits at the ovaries bilaterally from peritoneal carcinomatosis. Low-attenuation retrocrural nodes. Small RIGHT pleural effusion and minimal perihepatic ascites. Question metastasis at pancreatic tail. Sigmoid diverticulosis. BILATERAL inguinal hernias containing fat. Aortic atherosclerosis.       11/12/2015 Tumor Marker    Patient's tumor was tested for the following markers: CA125. Results of the tumor marker test revealed 301.8.      11/14/2015 Procedure    Peritoneal tumor just deep to the right abdominal wall was targeted. A tangential approach was performed with needle insertion to the left of midline. Three core biopsy needle passes resulted in 2 intact core biopsy specimens and 1 partial tissue fragment.  IMPRESSION: CT-guided core biopsy performed of peritoneal tumor.      11/14/2015 Pathology Results    Soft Tissue Needle Core Biopsy,  Peritoneal Cavity - ADENOCARCINOMA. - SEE MICROSCOPIC DESCRIPTION. Microscopic Comment The morphologic features are compatible with metastatic ovarian cancer. There is sufficient material for additional studies if requested. (JDP:gt, 11/17/15) PROGNOSTIC INDICATORS Results: IMMUNOHISTOCHEMICAL AND MORPHOMETRIC ANALYSIS PERFORMED MANUALLY Estrogen Receptor: 80%, POSITIVE, STRONG STAINING INTENSITY Progesterone Receptor: 10%, POSITIVE, STRONG STAINING INTENSITY REFERENCE RANGE ESTROGEN RECEPTOR NEGATIVE 0% POSITIVE =>1% REFERENCE RANGE PROGESTERONE RECEPTOR NEGATIVE 0% POSITIVE =>1% All controls stained appropriately      12/05/2015 Tumor Marker    Patient's tumor was tested for the following markers: CA125 Results of the tumor marker test revealed 469.2      12/10/2015 Imaging    Innumerable pulmonary nodules of variable size compatible with diffuse pulmonary metastatic disease. Nodules appear minimally larger and there are some new tiny pulmonary nodules in the lung bases compared to 11/07/2015. 2. Moderate and enlarging right pleural effusion. 3. Extensive peritoneal carcinomatosis in the upper abdomen. Possible tumor involving the pancreatic tail. 4. Borderline prominent right hilar lymph node.      12/12/2015 - 03/26/2016 Chemotherapy    She has received 6 cycles of carboplatin & taxol       12/25/2015 Tumor Marker    Patient's tumor was tested for the following markers: CA125 Results of the tumor marker test revealed 739.7      01/01/2016 Procedure    Ultrasound and fluoroscopically guided right internal jugular single lumen power port catheter insertion. Tip in the SVC/RA junction. Catheter ready for use      01/19/2016 Tumor Marker    Patient's tumor was tested for the following markers: CA125 Results of the tumor marker test revealed 421.9      02/04/2016 Imaging    CT CHEST IMPRESSION  1. Slight improvement in pulmonary metastasis. 2. Decrease in  low left  cervical/supraclavicular adenopathy, favoring response to therapy. 3. Decrease in small right pleural effusion.  CT ABDOMEN AND PELVIS IMPRESSION  1. Improvement in omental/peritoneal metastasis. 2. Enlarged cystic lesion within the left hemipelvis could represent a primary ovarian malignancy or metastasis. Right ovarian soft tissue fullness and heterogeneity is not significantly changed. 3. Pancreatic body/tail junction hypoattenuation is difficult to quantify secondary to morphology. However, felt to be increased in conspicuity today. Considerations include metastatic disease or a somewhat atypical distribution of pancreatic adenocarcinoma with metastasis. 4. Slight improvement in right common iliac high adenopathy. 5. Aortic atherosclerosis.       02/04/2016 Genetic Testing    Genetics testing normal 02-04-16 (298 gene myRisk panel by [employer]). Tumor BRACAnalysis and Genomic Instability Status Testing was also performed through [employer], negative      02/05/2016 Tumor Marker    Patient's tumor was tested for the following markers: CA125 Results of the tumor marker test revealed 307.1      03/04/2016 Tumor Marker    Patient's tumor was tested for the following markers: CA125 Results of the tumor marker test revealed 180.9      03/26/2016 Tumor Marker    Patient's tumor was tested for the following markers: CA125 Results of the tumor marker test revealed 132.8      04/15/2016 Tumor Marker    Patient's tumor was tested for the following markers: CA125 Results of the tumor marker test revealed 109.4      04/22/2016 Imaging    Today's study demonstrates a mixed response to therapy. Specifically, while the primary ovarian lesions and the omental caking has regressed compared to the prior examination, there is an increased number and size of pulmonary nodules, possible new hepatic lesion, enlarging lesion in the C7 vertebral body, and an enlarging lesion in the distal  body of the pancreas. 2. Lesion in the distal body of the pancreas has nonspecific imaging characteristics. This could represent a metastatic lesion. Alternatively, a primary pancreatic neoplasm could be considered. In the appropriate clinical setting, the possibility of a benign lesion such as a pancreatic pseudocyst could be considered, particularly if there is a recent history of pancreatitis.      05/11/2016 Tumor Marker    Patient's tumor was tested for the following markers: CA125 Results of the tumor marker test revealed 137.6      05/17/2016 - 06/03/2016 Radiation Therapy    Site/dose:   The cervical spine C7 was treated to 35 Gy in 14 fractions at 2.5 Gy per fraction.  Beams/energy:   3D // 10X, 6X      06/01/2016 Tumor Marker    Patient's tumor was tested for the following markers: CA125 Results of the tumor marker test revealed 182      The patient is profoundly weak.  Her oral intake is very little and according to her daughter, estimated intake daily is less than 400 kcal per day.  Her daughter also noted intermittent confusion at home She has nausea, anorexia and constipation.  She has been getting daily IV fluids in the outpatient clinic. Today, she is noted to have mild hypoxemia, hypotension and just feel very ill overall.   blood work came back profoundly abnormal with severe anemia, severe protein calorie malnutrition and relative hypercalcemia.  She is being admitted for management of all her symptoms above She also started to have some back pain without any radiation  MEDICAL HISTORY:  Past Medical History:  Diagnosis Date  . Diabetes mellitus  type II, mild  . Hyperlipidemia   . Hypertension    borderline  . Ovarian cancer [facility])     SURGICAL HISTORY: Past Surgical History:  Procedure Laterality Date  . ABDOMINAL HYSTERECTOMY     total  . IR GENERIC HISTORICAL  01/01/2016   IR US GUIDE VASC ACCESS RIGHT 01/01/2016 Greggory Keen, MD [facility]  . IR  GENERIC HISTORICAL  01/01/2016   IR FLUORO GUIDE PORT INSERTION RIGHT 01/01/2016 Greggory Keen, MD [facility]    SOCIAL HISTORY: Social History   Social History  . Marital status: Divorced    Spouse name: N/A  . Number of children: 2  . Years of education: 12   Occupational History  . retired Astronomer [employer]   Social History Main Topics  . Smoking status: Former Smoker    Packs/day: 0.25    Years: 7.00    Types: Cigarettes    Quit date: 03/01/1978  . Smokeless tobacco: Never Used     Comment: smoked 3-5 cigs per day for 7-8 yrs  . Alcohol use Not on file  . Drug use: Unknown  . Sexual activity: Not Currently   Other Topics Concern  . Not on file   Social History Narrative   HSG. Married '60 - 66yr/divorced. Married '80's - 364yrs/divorced. 1 dtr - '70, 1 son - '64. 2 grandchildren. Retired - [employer]. Lives in own home, son is autistic and lives with her. Staying busy.              FAMILY HISTORY: Family History  Problem Relation Age of Onset  . Hypertension Mother   . Other Mother     CVa  . Stroke Mother   . Cancer Brother     NOS cancer w/ metastasis; d. 636s . Prostate cancer Brother   . Other Son     Autistic  . Hypertension Father   . Stroke Father   . Hypertension Sister   . Breast cancer Sister 73 . Ovarian cysts Sister     TAH-BSO for benign ovarian cyst  . Seizures Brother     d. 717 . Diabetes Brother   . Prostate cancer Brother     dx "awhile ago"  . Cancer Cousin     maternal 1st cousin, once-removed dx NOS cancer in his late 547s-early 60s   ALLERGIES:  is allergic to codeine.  MEDICATIONS:  Current Facility-Administered Medications  Medication Dose Route Frequency Provider Last Rate Last Dose  . 0.9 %  sodium chloride infusion   Intravenous Continuous NHeath Lark MD      . acetaminophen (TYLENOL) tablet 650 mg  650 mg Oral Q4H PRN NHeath Lark MD      . dexamethasone (DECADRON) injection 4 mg  4 mg Intravenous Daily Artin Mceuen,  MD      . enoxaparin (LOVENOX) injection 40 mg  40 mg Subcutaneous Q24H NHeath Lark MD      . [Derrill MemoON 06/19/2016] feeding supplement (ENSURE ENLIVE) (ENSURE ENLIVE) liquid 237 mL  237 mL Oral BID BM Tresia Revolorio, MD      . HYDROmorphone (DILAUDID) tablet 4 mg  4 mg Oral Q4H PRN Piers Baade, MD      . insulin aspart (novoLOG) injection 0-24 Units  0-24 Units Subcutaneous TID WC Lelia Jons, MD      . lidocaine-prilocaine (EMLA) cream   Topical Once NHeath Lark MD      . loratadine (CLARITIN) tablet 10 mg  10 mg  Oral Daily PRN Heath Lark, MD      . LORazepam (ATIVAN) tablet 0.5 mg  0.5 mg Oral Q6H PRN Heath Lark, MD      . magnesium hydroxide (MILK OF MAGNESIA) suspension 30 mL  30 mL Oral Daily PRN Heath Lark, MD      . Melatonin CAPS 3 mg  3 mg Oral QHS PRN Heath Lark, MD      . mirtazapine (REMERON) tablet 15 mg  15 mg Oral QHS Vilma Will, MD      . morphine 4 MG/ML injection 1 mg  1 mg Intravenous Q2H PRN Heath Lark, MD      . multivitamin with minerals tablet 1 tablet  1 tablet Oral Daily Dael Howland, MD      . ondansetron (ZOFRAN) tablet 4-8 mg  4-8 mg Oral Q8H PRN Heath Lark, MD       Or  . ondansetron (ZOFRAN-ODT) disintegrating tablet 4-8 mg  4-8 mg Oral Q8H PRN Heath Lark, MD       Or  . ondansetron (ZOFRAN) injection 4 mg  4 mg Intravenous Q8H PRN Heath Lark, MD       Or  . ondansetron (ZOFRAN) 8 mg in sodium chloride 0.9 % 50 mL IVPB  8 mg Intravenous Q8H PRN Rolanda Campa, MD      . ondansetron (ZOFRAN-ODT) disintegrating tablet 8 mg  8 mg Oral Q8H PRN Jonatha Gagen, MD      . polyethylene glycol (MIRALAX / GLYCOLAX) packet 17 g  17 g Oral Daily PRN Heath Lark, MD      . senna-docusate (Senokot-S) tablet 1 tablet  1 tablet Oral BID Heath Lark, MD       Facility-Administered Medications Ordered in Other Encounters  Medication Dose Route Frequency Provider Last Rate Last Dose  . 0.9 %  sodium chloride infusion   Intravenous Continuous Jacqulynn Cadet, MD      . 0.9 %  sodium chloride  infusion  250 mL Intravenous Once Heath Lark, MD      . heparin lock flush 100 unit/mL  500 Units Intracatheter Once PRN Heath Lark, MD      . heparin lock flush 100 unit/mL  500 Units Intracatheter Daily PRN Heath Lark, MD      . HYDROmorphone (DILAUDID) injection 1 mg  1 mg Intravenous Q2H PRN Heath Lark, MD   1 mg at 06/18/16 1049  . sodium chloride flush (NS) 0.9 % injection 10 mL  10 mL Intracatheter PRN Heath Lark, MD      . sodium chloride flush (NS) 0.9 % injection 10 mL  10 mL Intracatheter PRN Heath Lark, MD        REVIEW OF SYSTEMS:   Constitutional: Denies fevers, chills or abnormal night sweats Eyes: Denies blurriness of vision, double vision or watery eyes Ears, nose, mouth, throat, and face: Denies mucositis or sore throat Respiratory: Denies cough, dyspnea or wheezes Cardiovascular: Denies palpitation, chest discomfort or lower extremity swelling Skin: Denies abnormal skin rashes Lymphatics: Denies new lymphadenopathy or easy bruising Behavioral/Psych: Mood is stable, no new changes  All other systems were reviewed with the patient and are negative.  PHYSICAL EXAMINATION: ECOG PERFORMANCE STATUS: 3 - Symptomatic, >50% confined to bed  Vitals:   06/18/16 1623  BP: (!) 142/81  Pulse: 88  Resp: 18  Temp: 98.4 F (36.9 C)   Filed Weights   06/18/16 1623  Weight: 135 lb (61.2 kg)    GENERAL:alert, no distress and comfortable.  She looks thin and cachectic SKIN: skin color, texture, turgor are normal, no rashes or significant lesions EYES: normal, conjunctiva are pale and non-injected, sclera clear OROPHARYNX: She has dry mucous membrane NECK: supple, thyroid normal size, non-tender, without nodularity LYMPH:  no palpable lymphadenopathy in the cervical, axillary or inguinal LUNGS: clear to auscultation and percussion with normal breathing effort HEART: Tachycardia, no murmurs and no lower extremity edema ABDOMEN:abdomen soft, non-tender and normal bowel  sounds Musculoskeletal:no cyanosis of digits and no clubbing  PSYCH: alert & oriented x 3 with fluent speech NEURO: no focal motor/sensory deficits  LABORATORY DATA:  I have reviewed the data as listed Lab Results  Component Value Date   WBC 14.4 (H) 06/18/2016   HGB 6.5 (LL) 06/18/2016   HCT 21.0 (L) 06/18/2016   MCV 76.3 (L) 06/18/2016   PLT 202 06/18/2016    Recent Labs  01/01/16 1007  05/12/16 0534 05/13/16 0500 05/26/16 1030 06/01/16 0826 06/18/16 1049  NA 136  < > 134* 139 136 137 144  K 4.4  < > 4.1 4.1 4.6 4.0 3.4*  CL 102  --  101 106  --   --   --   CO2 25  < > '26 25 27 26 26  '$ GLUCOSE 151*  < > 168* 174* 254* 263* 231*  BUN 20  < > 17 13 26.0 17.0 14.4  CREATININE 0.74  < > 0.94 0.71 1.2* 0.8 0.6  CALCIUM 9.9  < > 8.8* 9.0 10.8* 10.4 9.5  GFRNONAA >60  --  57* >60  --   --   --   GFRAA >60  --  >60 >60  --   --   --   PROT  --   < > 6.5 6.3* 7.8 7.0 5.8*  ALBUMIN  --   < > 2.6* 2.7* 3.1* 2.9* 2.1*  AST  --   < > '22 29 22 24 '$ 33  ALT  --   < > '22 23 22 21 22  '$ ALKPHOS  --   < > 66 65 91 77 110  BILITOT  --   < > 0.8 0.4 0.62 0.49 0.79  < > = values in this interval not displayed.  ASSESSMENT & PLAN:   Ovarian cancer on right [facility]) The patient is very frail and is experiencing a lot of side effects from recent radiation treatment I have a long discussion with the daughter With her progressive decline in performance status, the patient is not a candidate for further palliative chemotherapy Once she is admitted, I will get palliative care to talk to the daughter and discussed options related to home-based palliative care versus residential hospice  Anemia due to antineoplastic chemotherapy There is a component of anemia of chronic disease from recent chemotheraoy. The patient denies recent history of bleeding such as epistaxis, hematuria or hematochezia.  We discussed some of the risks, benefits, and alternatives of blood transfusions. The patient is  symptomatic from anemia and the hemoglobin level is critically low.  Some of the side-effects to be expected including risks of transfusion reactions, chills, infection, syndrome of volume overload and risk of hospitalization from various reasons and the patient is willing to proceed and went ahead to sign consent today. I have ordered 2 units of blood transfusion for her  Malignant cachexia ([place]) Her appetite is very poor due to malignant cachexia and esophagitis She has not seen much benefit with Remeron Recommend addition of low-dose prednisone daily, but appetite remains poor  With ongoing nausea, I will add Marinol  Metastasis to bone [facility]) She has painful bone lesion.  CT demonstrated T7 involvement. She has recently completed radiation treatment. She will continue narcotic prescription as needed  Controlled type 2 diabetes mellitus without complication ([place]) She will continue insulin treatment and I plan to hold oral hypoglycemics due to poor oral intake  Cancer associated pain She has intermittent abdominal pain, likely due to cancer It could also be contributed by constipation We discussed aggressive laxative therapy For now, she will continue taking narcotic prescription  Severe protein calorie malnutrition Severe nausea We will consult dietitian Nausea is multifactorial We will try Marinol  Malignant hypercalcemia Corrected serum calcium is high I will restart dexamethasone Continue IV fluid resuscitation  Intermittent confusion Likely due to malignant hypercalcemia Observe for now  CODE STATUS I have discussed with the patient's daughter I recommend DNR  Goals of care I have spent significant amount of time with the patient's daughter today to discuss goals of care, prognosis, CODE STATUS.  She has shared with me that she is unlikely going to be able to provide home-based palliative care due to her 2 younger daughters, her full time job and a brother who is  disabled We will consult palliative care to have further discussion with the patient and family members related to palliative care options, optimization of symptom management and discussion with possibility of transitioning her care to comfort measures in the future upon discharge  Discharge planning I recommend she stay over the weekend to treat all her symptoms above  All questions were answered. The patient knows to call the clinic with any problems, questions or concerns.    Heath Lark, MD 06/18/2016 5:10 PM

## 2016-06-18 NOTE — Telephone Encounter (Signed)
Per desk nurse disregard 4/18 schedule message re fluids - patient being admitted.

## 2016-06-18 NOTE — Progress Notes (Signed)
PHARMACIST - PHYSICIAN ORDER COMMUNICATION  CONCERNING: P&T Medication Policy on Herbal Medications  DESCRIPTION:  This patient's order for:  Melatonin  has been noted.  This product(s) is classified as an "herbal" or natural product. Due to a lack of definitive safety studies or FDA approval, nonstandard manufacturing practices, plus the potential risk of unknown drug-drug interactions while on inpatient medications, the Pharmacy and Therapeutics Committee does not permit the use of "herbal" or natural products of this type within [facility].   ACTION TAKEN: The pharmacy department is unable to verify this order at this time and your patient has been informed of this safety policy. Please reevaluate patient's clinical condition at discharge and address if the herbal or natural product(s) should be resumed at that time.  Thank you,   Minda Ditto PharmD Pager [phone] 06/18/2016, 8:20 PM

## 2016-06-19 ENCOUNTER — Encounter (HOSPITAL_COMMUNITY): Payer: Self-pay | Admitting: *Deleted

## 2016-06-19 DIAGNOSIS — Z515 Encounter for palliative care: Secondary | ICD-10-CM

## 2016-06-19 LAB — COMPREHENSIVE METABOLIC PANEL
ALT: 21 U/L (ref 14–54)
AST: 27 U/L (ref 15–41)
Albumin: 2.1 g/dL — ABNORMAL LOW (ref 3.5–5.0)
Alkaline Phosphatase: 84 U/L (ref 38–126)
Anion gap: 9 (ref 5–15)
BILIRUBIN TOTAL: 0.9 mg/dL (ref 0.3–1.2)
BUN: 15 mg/dL (ref 6–20)
CO2: 26 mmol/L (ref 22–32)
CREATININE: 0.64 mg/dL (ref 0.44–1.00)
Calcium: 9.1 mg/dL (ref 8.9–10.3)
Chloride: 106 mmol/L (ref 101–111)
GFR calc Af Amer: >60 mL/min (ref >60)
Glucose, Bld: 232 mg/dL — ABNORMAL HIGH (ref 65–99)
Potassium: 3.9 mmol/L (ref 3.5–5.1)
Sodium: 141 mmol/L (ref 135–145)
TOTAL PROTEIN: 5.9 g/dL — AB (ref 6.5–8.1)

## 2016-06-19 LAB — GLUCOSE, CAPILLARY
GLUCOSE-CAPILLARY: 176 mg/dL — AB (ref 65–99)
GLUCOSE-CAPILLARY: 184 mg/dL — AB (ref 65–99)
Glucose-Capillary: 211 mg/dL — ABNORMAL HIGH (ref 65–99)

## 2016-06-19 LAB — CBC WITH DIFFERENTIAL/PLATELET
BASOS ABS: 0 10*3/uL (ref 0.0–0.1)
Basophils Relative: 0 %
EOS ABS: 0 10*3/uL (ref 0.0–0.7)
EOS PCT: 0 %
HCT: 27.8 % — ABNORMAL LOW (ref 36.0–46.0)
Hemoglobin: 9 g/dL — ABNORMAL LOW (ref 12.0–15.0)
Lymphocytes Relative: 4 %
Lymphs Abs: 0.6 10*3/uL — ABNORMAL LOW (ref 0.7–4.0)
MCH: 25.1 pg — AB (ref 26.0–34.0)
MCHC: 32.4 g/dL (ref 30.0–36.0)
MCV: 77.4 fL — ABNORMAL LOW (ref 78.0–100.0)
MONO ABS: 0.3 10*3/uL (ref 0.1–1.0)
Monocytes Relative: 2 %
Neutro Abs: 15.7 10*3/uL — ABNORMAL HIGH (ref 1.7–7.7)
Neutrophils Relative %: 94 %
PLATELETS: 175 10*3/uL (ref 150–400)
RBC: 3.59 MIL/uL — AB (ref 3.87–5.11)
RDW: 15.4 % (ref 11.5–15.5)
WBC: 16.6 10*3/uL — AB (ref 4.0–10.5)

## 2016-06-19 MED ORDER — LORAZEPAM 2 MG/ML IJ SOLN
0.5000 mg | INTRAMUSCULAR | Status: DC | PRN
  Administered 2016-06-28 – 2016-06-30 (×8): 0.5 mg via INTRAVENOUS
  Filled 2016-06-19 (×8): qty 1

## 2016-06-19 NOTE — Progress Notes (Signed)
Leslie Rowe   DOB:1936-02-28   #:[id number]    Subjective: The patient is sleeping.  She was noted to be agitated overnight with pain and a low-grade fever She had problems swallowing and difficulties going to the bathroom A family friend, Shirlean Mylar is with her throughout yesterday  Objective:  Vitals:   06/18/16 2330 06/19/16 0519  BP:  128/76  Pulse:  84  Resp:  16  Temp: 99.1 F (37.3 C) 98.2 F (36.8 C)     Intake/Output Summary (Last 24 hours) at 06/19/16 0801 Last data filed at 06/19/16 3818  Gross per 24 hour  Intake           718.33 ml  Output                0 ml  Net           718.33 ml    GENERAL: She is sleeping, appears comfortable   Labs:  Lab Results  Component Value Date   WBC 16.6 (H) 06/19/2016   HGB 9.0 (L) 06/19/2016   HCT 27.8 (L) 06/19/2016   MCV 77.4 (L) 06/19/2016   PLT 175 06/19/2016   NEUTROABS 15.7 (H) 06/19/2016    Lab Results  Component Value Date   NA 141 06/19/2016   K 3.9 06/19/2016   CL 106 06/19/2016   CO2 26 06/19/2016    Assessment & Plan:   Ovarian cancer on right [facility]) The patient is very frail and is experiencing a lot of side effects from recent radiation treatment I have a long discussion with the daughter With her progressive decline in performance status, the patient is not a candidate for further palliative chemotherapy I will get palliative care to talk to the daughter and discussed options related to home-based palliative care versus residential hospice; I would prefer for her to go to residential hospice due to family dynamics  Anemia due to antineoplastic chemotherapy She had received 2 units of blood transfusion on 06/18/2016  Malignant cachexia ([place]) Her appetite is very poor due to malignant cachexia and esophagitis She has not seen much benefit with Remeron Even with recent steroid treatment her appetite remained poor With ongoing nausea, I will add Marinol  Metastasis to bone [facility]) She has painful  bone lesion. CT demonstrated T7 involvement. She has recently completed radiation treatment. She will continue pain medication prn  Controlled type 2 diabetes mellitus without complication ([place]) She will continue insulin treatment and I plan to hold oral hypoglycemics due to poor oral intake We will consider stopping that if comfort care decision is made  Cancer associated pain and constipation She has intermittent abdominal pain, likely due to cancer It could also be contributed by constipation We discussed aggressive laxative therapy For now, she will continue taking narcotic prescription  Severe protein calorie malnutrition Severe nausea We will consult dietitian Nausea is multifactorial We will try Marinol  Malignant hypercalcemia Corrected serum calcium is high I will restart dexamethasone Continue IV fluid resuscitation  Intermittent confusion Likely due to malignant hypercalcemia Observe for now  CODE STATUS I have discussed with the patient's daughter I recommend DNR  Goals of care I have spent significant amount of time with the patient's daughter today to discuss goals of care, prognosis, CODE STATUS.  She has shared with me that she is unlikely going to be able to provide home-based palliative care due to her 2 younger daughters, her full time job and a brother who is disabled We discussed this morning  again about goals of care and she is interested to discuss further with palliative care service  Discharge planning I recommend she stay over the weekend to treat all her symptoms above  Heath Lark, MD 06/19/2016  8:01 AM

## 2016-06-19 NOTE — Progress Notes (Signed)
Paged Oncology on call to have route of Tylenol changed from tablet form to oral solution because patient states it hurts to swallow.No response. 650 mg Tylenol was administered after crushing and adding apple sauce. No further issues noted. Will continue to monitor. Daughter at bedside.

## 2016-06-19 NOTE — Consult Note (Signed)
Consultation Note Date: 06/19/2016   Patient Name: Leslie Rowe  DOB: Aug 26, 1935  MRN: [id number]  Age / Sex: 81 y.o., female  PCP: Hoyt Koch, MD Referring Physician: Heath Lark, MD  Reason for Consultation: Establishing goals of care  HPI/Patient Profile: 81 y.o. female  with past medical history of metastatic ovarian cancer  admitted on 06/18/2016 with severe anemia, dehydration, malignant hypercalcemia, declining functional status.    Clinical Assessment and Goals of Care:  A palliative consult has been requested for disposition options, additional goals of care discussions.  Patient is an elderly lady resting in bed. She had fevers overnight. She did not rest well overnight. Patient's family friend Shirlean Mylar is present at the bedside. Chart reviewed in detail, discussed with Robyn in detail, appreciate information given by Dr. Alvy Bimler from oncology. Call placed and discussed with daughter in detail. See recommendations below. Thank you for the consult.  HCPOA  daughter  SUMMARY OF RECOMMENDATIONS    Agree with DNR DNI Agree with current symptom management options   Disposition: Discussed with patient's daughter over the phone regarding the patient's current condition, prognosis and hospice philosophy, options for home with hospice versus residential hospice discussed in detail. All information about residential hospice given to patient's daughter, all questions answered to the best of my ability. Daughter is now leaning towards residential hospice, I will follow up in am.   We discussed about artificial nutrition and hydration, end of life signs and symptoms discussed in detail, for now, ok to continue with IVF and current management, daughter understands that hospice will provide comfort feedings but not IVF, not transfusions.   Code Status/Advance Care Planning:  DNR    Symptom  Management:      Palliative Prophylaxis:   Bowel Regimen     Psycho-social/Spiritual:   Desire for further Chaplaincy support:no  Additional Recommendations: Education on Hospice  Prognosis:   < 2 weeks  Discharge Planning: Hospice facility      Primary Diagnoses: Present on Admission: . Ovarian cancer on right ([place]) . Malignant neoplasm metastatic to lung ([place]) . Anemia due to antineoplastic chemotherapy . Metastasis to bone ([place]) . Hypercalcemia   I have reviewed the medical record, interviewed the patient and family, and examined the patient. The following aspects are pertinent.  Past Medical History:  Diagnosis Date  . Diabetes mellitus    type II, mild  . Hyperlipidemia   . Hypertension    borderline  . Ovarian cancer [facility])    Social History   Social History  . Marital status: Divorced    Spouse name: N/A  . Number of children: 2  . Years of education: 12   Occupational History  . retired Astronomer [employer]   Social History Main Topics  . Smoking status: Former Smoker    Packs/day: 0.25    Years: 7.00    Types: Cigarettes    Quit date: 03/01/1978  . Smokeless tobacco: Never Used     Comment: smoked 3-5 cigs per day for 7-8 yrs  .  Alcohol use Not on file  . Drug use: Unknown  . Sexual activity: Not Currently   Other Topics Concern  . Not on file   Social History Narrative   HSG. Married '60 - 68yrs/divorced. Married '80's - 48 yrs/divorced. 1 dtr - '70, 1 son - '64. 2 grandchildren. Retired - [employer]. Lives in own home, son is autistic and lives with her. Staying busy.             Family History  Problem Relation Age of Onset  . Hypertension Mother   . Other Mother     CVa  . Stroke Mother   . Cancer Brother     NOS cancer w/ metastasis; d. 83s  . Prostate cancer Brother   . Other Son     Autistic  . Hypertension Father   . Stroke Father   . Hypertension Sister   . Breast cancer Sister 48  . Ovarian cysts Sister     TAH-BSO  for benign ovarian cyst  . Seizures Brother     d. 75  . Diabetes Brother   . Prostate cancer Brother     dx "awhile ago"  . Cancer Cousin     maternal 1st cousin, once-removed dx NOS cancer in his late 50s-early 60s   Scheduled Meds: . dexamethasone  4 mg Intravenous Daily  . dronabinol  2.5 mg Oral BID AC  . enoxaparin (LOVENOX) injection  40 mg Subcutaneous Q24H  . feeding supplement (ENSURE ENLIVE)  237 mL Oral BID BM  . insulin aspart  0-24 Units Subcutaneous TID WC  . lidocaine-prilocaine   Topical Once  . mirtazapine  15 mg Oral QHS  . multivitamin with minerals  1 tablet Oral Daily  . senna-docusate  1 tablet Oral BID   Continuous Infusions: . sodium chloride 50 mL/hr at 06/18/16 2129  . ondansetron (ZOFRAN) IV     PRN Meds:.acetaminophen, HYDROmorphone, loratadine, LORazepam, LORazepam, magnesium hydroxide, morphine injection, ondansetron **OR** ondansetron **OR** ondansetron (ZOFRAN) IV **OR** ondansetron (ZOFRAN) IV, ondansetron, polyethylene glycol Medications Prior to Admission:  Prior to Admission medications   Medication Sig Start Date End Date Taking? Authorizing Provider  feeding supplement, ENSURE ENLIVE, (ENSURE ENLIVE) LIQD Take 237 mLs by mouth 2 (two) times daily between meals. Patient taking differently: Take 237 mLs by mouth daily.  05/13/16  Yes Heath Lark, MD  HYDROmorphone (DILAUDID) 4 MG tablet Take 1 tablet (4 mg total) by mouth every 4 (four) hours as needed for severe pain. 06/15/16  Yes Heath Lark, MD  ibuprofen (ADVIL,MOTRIN) 200 MG tablet Take 200 mg by mouth every 6 (six) hours as needed for headache, mild pain or moderate pain.    Yes Historical Provider, MD  insulin lispro (HUMALOG KWIKPEN) 100 UNIT/ML KiwkPen Check sugars and if: 0-150 take no insulin, 151-250 take 2 units, 251-300 take 4 units, 301-400, take 6 units, >400 call office. Patient taking differently: Inject 0-6 Units into the skin at bedtime as needed (for high blood sugar). Check  sugars and if: 0-150 take no insulin, 151-250 take 2 units, 251-300 take 4 units, 301-400, take 6 units, >400 call office. 12/09/15  Yes Hoyt Koch, MD  lidocaine-prilocaine (EMLA) cream Apply to PortaCath 1-2 hrs prior to access as directed to numb area. 12/05/15  Yes Lennis Marion Downer, MD  lisinopril-hydrochlorothiazide (PRINZIDE,ZESTORETIC) 20-12.5 MG tablet take 1 tablet by mouth once daily 12/29/15  Yes Hoyt Koch, MD  loratadine (CLARITIN) 10 MG tablet Take 10 mg by mouth daily  as needed for allergies.   Yes Historical Provider, MD  LORazepam (ATIVAN) 0.5 MG tablet Place 1 tablet under the tongue or swallow every 6 hrs as needed for nausea. Will make drowsy. Patient taking differently: Take 0.5 mg by mouth every 6 (six) hours as needed (nausea).  06/07/16  Yes Heath Lark, MD  magnesium hydroxide (MILK OF MAGNESIA) 400 MG/5ML suspension Take 30 mLs by mouth daily as needed for mild constipation.   Yes Historical Provider, MD  Melatonin 3 MG CAPS Take 3 mg by mouth at bedtime as needed (for sleep).    Yes Historical Provider, MD  metFORMIN (GLUCOPHAGE) 1000 MG tablet take 1 tablet by mouth twice a day with food Patient taking differently: Take 1,000 mg by mouth twice daily with food 01/27/16  Yes Hoyt Koch, MD  Multiple Vitamin (MULTIVITAMIN WITH MINERALS) TABS tablet Take 1 tablet by mouth daily.   Yes Historical Provider, MD  ondansetron (ZOFRAN ODT) 8 MG disintegrating tablet Take 1 tablet (8 mg total) by mouth every 8 (eight) hours as needed for nausea or vomiting. 06/07/16  Yes Heath Lark, MD  ondansetron (ZOFRAN) 8 MG tablet Take 1 tablet (8 mg total) by mouth every 8 (eight) hours as needed for nausea or vomiting (Will not make drowsy.). 12/05/15  Yes Lennis Marion Downer, MD  polyethylene glycol (MIRALAX / GLYCOLAX) packet Take 17 g by mouth every morning.    Yes Historical Provider, MD  sucralfate (CARAFATE) 1 GM/10ML suspension Take 10 mLs (1 g total) by mouth 4 (four)  times daily -  with meals and at bedtime. Patient taking differently: Take 1 g by mouth daily.  06/01/16  Yes Gery Pray, MD  ciprofloxacin (CIPRO) 250 MG tablet Take 1 tablet (250 mg total) by mouth 2 (two) times daily. Patient not taking: Reported on 06/18/2016 05/26/16   Heath Lark, MD  lidocaine (XYLOCAINE) 2 % solution Use as directed 10 mLs in the mouth or throat 3 (three) times daily before meals. Patient not taking: Reported on 06/18/2016 06/01/16   Gery Pray, MD  mirtazapine (REMERON) 15 MG tablet Take 1 tablet (15 mg total) by mouth at bedtime. Patient not taking: Reported on 06/18/2016 05/13/16   Heath Lark, MD  predniSONE (DELTASONE) 10 MG tablet Take 1 tablet (10 mg total) by mouth daily with breakfast. Patient not taking: Reported on 06/18/2016 05/26/16   Heath Lark, MD   Allergies  Allergen Reactions  . Codeine Nausea And Vomiting   Review of Systems Generalized weakness  Physical Exam Elderly frail lady NAD S1 S2 Regular Abdomen soft  No edema Not awake  Vital Signs: BP (!) 141/90 (BP Location: Right Arm)   Pulse 91   Temp 98.6 F (37 C) (Oral)   Resp 18   Ht 5\' 6"  (1.676 m)   Wt 61.2 kg (135 lb)   SpO2 96%   BMI 21.79 kg/m  Pain Assessment: 0-10   Pain Score: 0-No pain   SpO2: SpO2: 96 % O2 Device:SpO2: 96 % O2 Flow Rate: .   IO: Intake/output summary:  Intake/Output Summary (Last 24 hours) at 06/19/16 1433 Last data filed at 06/19/16 1418  Gross per 24 hour  Intake          1238.33 ml  Output                0 ml  Net          1238.33 ml    LBM: Last BM Date: 06/18/16 Baseline Weight:  Weight: 61.2 kg (135 lb) Most recent weight: Weight: 61.2 kg (135 lb)     Palliative Assessment/Data:   Flowsheet Rows     Most Recent Value  Intake Tab  Referral Department  Oncology  Unit at Time of Referral  Oncology Unit  Palliative Care Primary Diagnosis  Cancer  Palliative Care Type  New Palliative care  Reason for referral  Clarify Goals of Care    Date first seen by Palliative Care  06/19/16  Clinical Assessment  Palliative Performance Scale Score  20%  Pain Max last 24 hours  4  Pain Min Last 24 hours  3  Dyspnea Max Last 24 Hours  4  Dyspnea Min Last 24 hours  3  Nausea Max Last 24 Hours  4  Nausea Min Last 24 Hours  3  Psychosocial & Spiritual Assessment  Palliative Care Outcomes  Patient/Family meeting held?  Yes  Who was at the meeting?  daughter and friend.   Palliative Care Outcomes  Clarified goals of care      Time In:  11 Time Out:  12.10 Time Total:  70 min  Greater than 50%  of this time was spent counseling and coordinating care related to the above assessment and plan.  Signed by: Loistine Chance, MD  [phone]  Please contact Palliative Medicine Team phone at [phone] for questions and concerns.  For individual provider: See Shea Evans

## 2016-06-20 LAB — TYPE AND SCREEN
ABO/RH(D): O POS
ANTIBODY SCREEN: NEGATIVE
UNIT DIVISION: 0
Unit division: 0

## 2016-06-20 LAB — GLUCOSE, CAPILLARY
GLUCOSE-CAPILLARY: 149 mg/dL — AB (ref 65–99)
Glucose-Capillary: 208 mg/dL — ABNORMAL HIGH (ref 65–99)
Glucose-Capillary: 187 mg/dL — ABNORMAL HIGH (ref 65–99)
Glucose-Capillary: 163 mg/dL — ABNORMAL HIGH (ref 65–99)
Glucose-Capillary: 186 mg/dL — ABNORMAL HIGH (ref 65–99)

## 2016-06-20 LAB — BPAM RBC
Blood Product Expiration Date: 201805102359
Blood Product Expiration Date: 201805162359
ISSUE DATE / TIME: 201804201300
ISSUE DATE / TIME: 201804201716
Unit Type and Rh: 5100
Unit Type and Rh: 5100

## 2016-06-20 NOTE — Progress Notes (Signed)
Leslie Rowe   DOB:[birth year]   VZ#:[id number]    Subjective: The patient is very tired and did not say much.  She denies nausea. She have esophageal pain and back pain, controlled with prescription IV pain medicine She has minimum oral intake.  Objective:  Vitals:   06/19/16 2030 06/20/16 0444  BP: (!) 152/78 139/73  Pulse: 94 96  Resp: 18 18  Temp: 98.1 F (36.7 C) 98.6 F (37 C)     Intake/Output Summary (Last 24 hours) at 06/20/16 4128 Last data filed at 06/20/16 7867  Gross per 24 hour  Intake             1480 ml  Output                0 ml  Net             1480 ml    GENERAL:alert, no distress and comfortable SKIN: skin color, texture, turgor are normal, no rashes or significant lesions EYES: normal, Conjunctiva are pink and non-injected, sclera clear Musculoskeletal:no cyanosis of digits and no clubbing  NEURO: alert & oriented x 3 with fluent speech, appears very weak, need full assistance to get out of bed  Labs:  Lab Results  Component Value Date   WBC 16.6 (H) 06/19/2016   HGB 9.0 (L) 06/19/2016   HCT 27.8 (L) 06/19/2016   MCV 77.4 (L) 06/19/2016   PLT 175 06/19/2016   NEUTROABS 15.7 (H) 06/19/2016    Lab Results  Component Value Date   NA 141 06/19/2016   K 3.9 06/19/2016   CL 106 06/19/2016   CO2 26 06/19/2016    Assessment & Plan:   Ovarian cancer on right [facility]) The patient is very frail and is experiencing a lot of side effects from recent radiation treatment I have a long discussion with the daughter With her progressive decline in performance status, the patient is not a candidate for further palliative chemotherapy I will get palliative care to talk to the daughter and discussed options related to home-based palliative care versus residential hospice; however, it is clear to me neither the daughter or the patient is willing to sign up to hospice.  Anemia due to antineoplastic chemotherapy She had received 2 units of blood transfusion on  06/18/2016  Malignant cachexia ([place]) Her appetite is very poor due to malignant cachexia and esophagitis She has not seen much benefit with Remeron Even with recent steroid treatment her appetite remained poor With ongoing nausea, I will add Marinol  Metastasis to bone [facility]) She haspainful bone lesion. CT demonstrated T7 involvement. She has recently completed radiation treatment. She will continue pain medication prn  Controlled type 2 diabetes mellitus without complication ([place]) She will continue insulin treatment and I plan to hold oral hypoglycemics due to poor oral intake  Cancer associated pain and constipation She has intermittent abdominal pain, likely due to cancer It could also be contributed by constipation We discussed aggressive laxative therapy For now, she will continue taking narcotic prescription  Severe protein calorie malnutrition Severe nausea, resolving We will consult dietitian Nausea is multifactorial We will try Marinol  Malignant hypercalcemia Corrected serum calcium is high I will restart dexamethasone Continue IV fluid resuscitation Recheck tomorrow  Intermittent confusion, improved Likely due to malignant hypercalcemia Observe for now  CODE STATUS I have discussed with the patient's daughter I recommend DNR  Goals of care I have extensive goals of care discussion with the patient and her daughter over the  phone. With mild symptomatic improvement, the patient wants to go home. Her daughter wants to take her home and bring her back daily to the cancer center for continuous IV fluid support and blood transfusion as needed to keep her alive longer I told the patient is constituted aggressive care and she would not qualify for hospice at this point The patient's and her daughter wants to continue aggressive careuntil she deteriorates again before enrollment into hospice I appreciate palliative care to continue follow on the  patient  Discharge planning I recommend she stays over the weekend to treat all her symptoms above   Heath Lark, MD 06/20/2016  8:39 AM

## 2016-06-20 NOTE — Progress Notes (Signed)
Daily Progress Note   Patient Name: Leslie Rowe       Date: 06/20/2016 DOB: 12/14/1935  Age: 81 y.o. MRN#: [id number] Attending Physician: Heath Lark, MD Primary Care Physician: Hoyt Koch, MD Admit Date: 06/18/2016  Reason for Consultation/Follow-up: Establishing goals of care  Subjective:  awake alert Daughter not at bedside See below:  Length of Stay: 2  Current Medications: Scheduled Meds:  . dexamethasone  4 mg Intravenous Daily  . dronabinol  2.5 mg Oral BID AC  . enoxaparin (LOVENOX) injection  40 mg Subcutaneous Q24H  . feeding supplement (ENSURE ENLIVE)  237 mL Oral BID BM  . insulin aspart  0-24 Units Subcutaneous TID WC  . lidocaine-prilocaine   Topical Once  . mirtazapine  15 mg Oral QHS  . multivitamin with minerals  1 tablet Oral Daily  . senna-docusate  1 tablet Oral BID    Continuous Infusions: . sodium chloride 1,000 mL (06/20/16 0747)  . ondansetron (ZOFRAN) IV      PRN Meds: acetaminophen, HYDROmorphone, loratadine, LORazepam, LORazepam, magnesium hydroxide, morphine injection, ondansetron **OR** ondansetron **OR** ondansetron (ZOFRAN) IV **OR** ondansetron (ZOFRAN) IV, ondansetron, polyethylene glycol  Physical Exam         NAD Weak frail S1 S2 Clear Abdomen soft Trace edema Awake alert Able to converse meaningfully  Vital Signs: BP 139/73 (BP Location: Right Arm)   Pulse 96   Temp 98.6 F (37 C) (Oral)   Resp 18   Ht 5\' 6"  (1.676 m)   Wt 61.2 kg (135 lb)   SpO2 97%   BMI 21.79 kg/m  SpO2: SpO2: 97 % O2 Device: O2 Device: Not Delivered O2 Flow Rate:    Intake/output summary:  Intake/Output Summary (Last 24 hours) at 06/20/16 1400 Last data filed at 06/20/16 1115  Gross per 24 hour  Intake             1120 ml  Output                 0 ml  Net             1120 ml   LBM: Last BM Date: 06/18/16 Baseline Weight: Weight: 61.2 kg (135 lb) Most recent weight: Weight: 61.2 kg (135 lb)       Palliative Assessment/Data:    Flowsheet  Rows     Most Recent Value  Intake Tab  Referral Department  Oncology  Unit at Time of Referral  Oncology Unit  Palliative Care Primary Diagnosis  Cancer  Palliative Care Type  New Palliative care  Reason for referral  Clarify Goals of Care  Date first seen by Palliative Care  06/19/16  Clinical Assessment  Palliative Performance Scale Score  20%  Pain Max last 24 hours  4  Pain Min Last 24 hours  3  Dyspnea Max Last 24 Hours  4  Dyspnea Min Last 24 hours  3  Nausea Max Last 24 Hours  4  Nausea Min Last 24 Hours  3  Psychosocial & Spiritual Assessment  Palliative Care Outcomes  Patient/Family meeting held?  Yes  Who was at the meeting?  daughter and friend.   Palliative Care Outcomes  Clarified goals of care      Patient Active Problem List   Diagnosis Date Noted  . Cancer associated pain 06/02/2016  . UTI (urinary tract infection) 05/26/2016  . Malignant cachexia ([place]) 05/26/2016  . Hypercalcemia 05/11/2016  . Metastasis to bone ([place]) 04/29/2016  . Goals of care, counseling/discussion 04/29/2016  . Pancreatic lesion 04/29/2016  . Iron deficiency anemia 04/15/2016  . Chemotherapy-induced peripheral neuropathy ([place]) 03/19/2016  . International Federation of Gynecology and Obstetrics (FIGO) stage IVB epithelial ovarian cancer ([place]) 02/28/2016  . Genetic testing 02/25/2016  . Family history of breast cancer in sister 01/27/2016  . Anemia due to antineoplastic chemotherapy 01/20/2016  . Port catheter in place 01/08/2016  . Chemotherapy induced neutropenia ([place]) 12/27/2015  . Malignant neoplasm metastatic to lung ([place]) 12/20/2015  . Ovarian cancer on right ([place]) 12/20/2015  . Pleural effusion 12/20/2015  . Pulmonary nodules/lesions, multiple 12/07/2015  .  Peritoneal carcinomatosis ([place]) 12/07/2015  . Primary peritoneal carcinomatosis ([place]) 12/03/2015  . Lower abdominal pain 10/10/2015  . GERD (gastroesophageal reflux disease) 04/16/2014  . Memory loss 12/06/2012  . Routine health maintenance 10/09/2011  . Controlled type 2 diabetes mellitus without complication ([place]) 40/81/4481  . Hyperlipidemia 12/21/2006  . Essential hypertension 12/21/2006    Palliative Care Assessment & Plan   Patient Profile:    Assessment:  ovarian cancer Frailty Functional decline Malignant cachexia Malignant pain Constipation Malignant hypercalcemia  Recommendations/Plan:   Goals of care: Leslie Rowe was quite awake/alert this morning, resting in her bed. She states she is fully aware of her serious illness and her limited prognosis, and that she and her daughter are having conversations regularly trying to figure out next steps. I did discuss with her about hospice and how they can help keep her comfortable. Patient thanked me for coming, stated that she will discuss further with her daughter. I will follow up in am.  Continue current mode of care for now.    Code Status:    Code Status Orders        Start     Ordered   06/18/16 1705  Do not attempt resuscitation (DNR)  Continuous    Question Answer Comment  In the event of cardiac or respiratory ARREST Do not call a "code blue"   In the event of cardiac or respiratory ARREST Do not perform Intubation, CPR, defibrillation or ACLS   In the event of cardiac or respiratory ARREST Use medication by any route, position, wound care, and other measures to relive pain and suffering. May use oxygen, suction and manual treatment of airway obstruction as needed for comfort.      06/18/16  1706    Code Status History    Date Active Date Inactive Code Status Order ID Comments User Context   05/11/2016 10:35 AM 05/13/2016  6:54 PM Full Code [id number]  Heath Lark, MD Inpatient    Advance Directive Documentation      Most Recent Value  Type of Advance Directive  Healthcare Power of [place]  Pre-existing out of facility DNR order (yellow form or pink MOST form)  -  "MOST" Form in Place?  -       Prognosis:   guarded.   Discharge Planning:  To Be Determined  Care plan was discussed with  Patient.   Thank you for allowing the Palliative Medicine Team to assist in the care of this patient.   Time In:  10 Time Out: 10.25 Total Time 25 Prolonged Time Billed  no       Greater than 50%  of this time was spent counseling and coordinating care related to the above assessment and plan.  Loistine Chance, MD [phone]  Please contact Palliative Medicine Team phone at [phone] for questions and concerns.

## 2016-06-21 ENCOUNTER — Encounter (HOSPITAL_COMMUNITY): Payer: Self-pay | Admitting: *Deleted

## 2016-06-21 ENCOUNTER — Inpatient Hospital Stay (HOSPITAL_COMMUNITY): Payer: Medicare Other

## 2016-06-21 LAB — COMPREHENSIVE METABOLIC PANEL
ALK PHOS: 100 U/L (ref 38–126)
ALT: 22 U/L (ref 14–54)
ANION GAP: 9 (ref 5–15)
AST: 36 U/L (ref 15–41)
Albumin: 2 g/dL — ABNORMAL LOW (ref 3.5–5.0)
BILIRUBIN TOTAL: 0.8 mg/dL (ref 0.3–1.2)
BUN: 16 mg/dL (ref 6–20)
CALCIUM: 9.2 mg/dL (ref 8.9–10.3)
CO2: 26 mmol/L (ref 22–32)
Chloride: 106 mmol/L (ref 101–111)
Creatinine, Ser: 0.56 mg/dL (ref 0.44–1.00)
GFR calc Af Amer: >60 mL/min (ref >60)
Glucose, Bld: 177 mg/dL — ABNORMAL HIGH (ref 65–99)
POTASSIUM: 3.3 mmol/L — AB (ref 3.5–5.1)
SODIUM: 141 mmol/L (ref 135–145)
TOTAL PROTEIN: 5.5 g/dL — AB (ref 6.5–8.1)

## 2016-06-21 LAB — GLUCOSE, CAPILLARY
GLUCOSE-CAPILLARY: 178 mg/dL — AB (ref 65–99)
GLUCOSE-CAPILLARY: 136 mg/dL — AB (ref 65–99)
GLUCOSE-CAPILLARY: 115 mg/dL — AB (ref 65–99)
GLUCOSE-CAPILLARY: 135 mg/dL — AB (ref 65–99)
GLUCOSE-CAPILLARY: 187 mg/dL — AB (ref 65–99)

## 2016-06-21 LAB — CBC WITH DIFFERENTIAL/PLATELET
BASOS PCT: 0 %
Basophils Absolute: 0 10*3/uL (ref 0.0–0.1)
EOS ABS: 0 10*3/uL (ref 0.0–0.7)
EOS PCT: 0 %
HCT: 28.3 % — ABNORMAL LOW (ref 36.0–46.0)
Hemoglobin: 9 g/dL — ABNORMAL LOW (ref 12.0–15.0)
LYMPHS ABS: 0.8 10*3/uL (ref 0.7–4.0)
Lymphocytes Relative: 4 %
MCH: 24.3 pg — AB (ref 26.0–34.0)
MCHC: 31.8 g/dL (ref 30.0–36.0)
MCV: 76.3 fL — ABNORMAL LOW (ref 78.0–100.0)
Monocytes Absolute: 1.4 10*3/uL — ABNORMAL HIGH (ref 0.1–1.0)
Monocytes Relative: 8 %
Neutro Abs: 14.7 10*3/uL — ABNORMAL HIGH (ref 1.7–7.7)
Neutrophils Relative %: 88 %
PLATELETS: 126 10*3/uL — AB (ref 150–400)
RBC: 3.71 MIL/uL — AB (ref 3.87–5.11)
RDW: 16.2 % — ABNORMAL HIGH (ref 11.5–15.5)
WBC: 16.9 10*3/uL — AB (ref 4.0–10.5)

## 2016-06-21 LAB — URINE CULTURE

## 2016-06-21 MED ORDER — OSMOLITE 1.5 CAL PO LIQD
1000.0000 mL | ORAL | Status: DC
  Administered 2016-06-21 – 2016-06-22 (×2): 1000 mL
  Filled 2016-06-21 (×2): qty 1000

## 2016-06-21 MED ORDER — VITAL HIGH PROTEIN PO LIQD: 1000.0000 mL | ORAL | Status: DC

## 2016-06-21 NOTE — Progress Notes (Signed)
Nursing Note: Pt up to the bathroom w/ 2 person assist.Pt weak,unsteady but insisted on going to the bathroom instead of the bedside commode.Pt required repeated cues for step by step instructions for transfer,toileting and back to bed.Daughter at the bedside says pt has been getting weaker each day.Pt a little disoriented in that she required a lot of cues for simple tasks.Pt assisted back to bed and was SOB after.Bed alrm on and daughter is at the bedside who has stayed for the past 2 nights.wbb

## 2016-06-21 NOTE — Progress Notes (Signed)
Daily Progress Note   Patient Name: Leslie Rowe       Date: 06/21/2016 DOB: 1935-08-29  Age: 81 y.o. MRN#: [id number] Attending Physician: Heath Lark, MD Primary Care Physician: Hoyt Koch, MD Admit Date: 06/18/2016  Reason for Consultation/Follow-up: Establishing goals of care  Subjective:   Ms Boltz is resting in bed, she does not open eyes to my voice command, sister is present at the bedside Call placed, unable to reach Ms Fenecia at [phone]  Length of Stay: 3  Current Medications: Scheduled Meds:  . dexamethasone  4 mg Intravenous Daily  . dronabinol  2.5 mg Oral BID AC  . enoxaparin (LOVENOX) injection  40 mg Subcutaneous Q24H  . feeding supplement (ENSURE ENLIVE)  237 mL Oral BID BM  . insulin aspart  0-24 Units Subcutaneous TID WC  . lidocaine-prilocaine   Topical Once  . mirtazapine  15 mg Oral QHS  . multivitamin with minerals  1 tablet Oral Daily  . senna-docusate  1 tablet Oral BID    Continuous Infusions: . sodium chloride 1,000 mL (06/20/16 0747)  . ondansetron (ZOFRAN) IV      PRN Meds: acetaminophen, HYDROmorphone, loratadine, LORazepam, LORazepam, magnesium hydroxide, morphine injection, ondansetron **OR** ondansetron **OR** ondansetron (ZOFRAN) IV **OR** ondansetron (ZOFRAN) IV, ondansetron, polyethylene glycol  Physical Exam         NAD Weak frail Breathing regular pattern  asleep, does not open eyes to voice command.   Vital Signs: BP (!) 155/88 (BP Location: Right Arm)   Pulse 68   Temp 98.4 F (36.9 C) (Oral)   Resp 18   Ht 5\' 6"  (1.676 m)   Wt 61.2 kg (135 lb)   SpO2 100%   BMI 21.79 kg/m  SpO2: SpO2: 100 % O2 Device: O2 Device: Not Delivered O2 Flow Rate:    Intake/output summary:   Intake/Output Summary (Last 24 hours)  at 06/21/16 1042 Last data filed at 06/21/16 0736  Gross per 24 hour  Intake          1338.33 ml  Output                0 ml  Net          1338.33 ml   LBM: Last BM Date: 06/18/16 Baseline Weight: Weight: 61.2 kg (135  lb) Most recent weight: Weight: 61.2 kg (135 lb)       Palliative Assessment/Data:    Flowsheet Rows     Most Recent Value  Intake Tab  Referral Department  Oncology  Unit at Time of Referral  Oncology Unit  Palliative Care Primary Diagnosis  Cancer  Palliative Care Type  New Palliative care  Reason for referral  Clarify Goals of Care  Date first seen by Palliative Care  06/19/16  Clinical Assessment  Palliative Performance Scale Score  20%  Pain Max last 24 hours  4  Pain Min Last 24 hours  3  Dyspnea Max Last 24 Hours  4  Dyspnea Min Last 24 hours  3  Nausea Max Last 24 Hours  4  Nausea Min Last 24 Hours  3  Psychosocial & Spiritual Assessment  Palliative Care Outcomes  Patient/Family meeting held?  Yes  Who was at the meeting?  daughter and friend.   Palliative Care Outcomes  Clarified goals of care      Patient Active Problem List   Diagnosis Date Noted  . Cancer associated pain 06/02/2016  . UTI (urinary tract infection) 05/26/2016  . Malignant cachexia ([place]) 05/26/2016  . Hypercalcemia 05/11/2016  . Metastasis to bone ([place]) 04/29/2016  . Goals of care, counseling/discussion 04/29/2016  . Pancreatic lesion 04/29/2016  . Iron deficiency anemia 04/15/2016  . Chemotherapy-induced peripheral neuropathy ([place]) 03/19/2016  . [employer] Federation of Gyne[employer] and Obstetric[employer] (FIGO) stage IVB epithelial ovarian cancer ([place]) 02/28/2016  . Genetic testing 02/25/2016  . Family history of breast cancer in sister 01/27/2016  . Anemia due to antineoplastic chemotherapy 01/20/2016  . Port catheter in place 01/08/2016  . Chemotherapy induced neutropenia ([place]) 12/27/2015  . Malignant neoplasm metastatic to lung ([place]) 12/20/2015  . Ovarian cancer on  right ([place]) 12/20/2015  . Pleural effusion 12/20/2015  . Pulmonary nodules/lesions, multiple 12/07/2015  . Peritoneal carcinomatosis ([place]) 12/07/2015  . Primary peritoneal carcinomatosis ([place]) 12/03/2015  . Lower abdominal pain 10/10/2015  . GERD (gastroesophageal reflux disease) 04/16/2014  . Memory loss 12/06/2012  . Routine health maintenance 10/09/2011  . Controlled type 2 diabetes mellitus without complication ([place]) 69/48/5462  . Hyperlipidemia 12/21/2006  . Essential hypertension 12/21/2006    Palliative Care Assessment & Plan   Patient Profile:    Assessment:  ovarian cancer Frailty Functional decline Malignant cachexia Malignant pain Constipation Malignant hypercalcemia  Recommendations/Plan:  Goals of care: [name] is unable/unwilling to discuss with me this am, call placed, unable to reach daughter.    We will continue efforts to have gentle honest conversations with patient and her family with regards to next steps and overall goals of care Continue current mode of care for now.    Code Status:    Code Status Orders        Start     Ordered   06/18/16 1705  Do not attempt resuscitation (DNR)  Continuous    Question Answer Comment  In the event of cardiac or respiratory ARREST Do not call a "code blue"   In the event of cardiac or respiratory ARREST Do not perform Intubation, CPR, defibrillation or ACLS   In the event of cardiac or respiratory ARREST Use medication by any route, position, wound care, and other measures to relive pain and suffering. May use oxygen, suction and manual treatment of airway obstruction as needed for comfort.      06/18/16 1706    Code Status History    Date Active Date Inactive  Code Status Order ID Comments User Context   05/11/2016 10:35 AM 05/13/2016  6:54 PM Full Code [id number]  Heath Lark, MD Inpatient    Advance Directive Documentation     Most Recent Value  Type of Advance Directive  Healthcare Power of Attorney    Pre-existing out of facility DNR order (yellow form or pink MOST form)  -  "MOST" Form in Place?  -       Prognosis:   guarded.   Discharge Planning:  To Be Determined  Care plan was discussed with  Patient's sister and sister in law present in the room  Thank you for allowing the Palliative Medicine Team to assist in the care of this patient.   Time In:  10 Time Out: 10.15 Total Time 15 Prolonged Time Billed  no       Greater than 50%  of this time was spent counseling and coordinating care related to the above assessment and plan.  Loistine Chance, MD [phone]  Please contact Palliative Medicine Team phone at [phone] for questions and concerns.

## 2016-06-21 NOTE — Progress Notes (Signed)
Nursing Note: Unable to draw labs from port.A; Paged IV team for assistance.wbb

## 2016-06-21 NOTE — Progress Notes (Signed)
Initial Nutrition Assessment  DOCUMENTATION CODES:   Severe malnutrition in context of chronic illness  INTERVENTION:   Monitor magnesium, potassium, and phosphorus daily for at least 3 days, MD to replete as needed, as pt is at risk for refeeding syndrome given severe malnutrition and poor PO intake x months.  Initiate Osmolite 1.5 @ 20 ml/hr via NGT. Will advance as tolerated.  Goal rate: Osmolite 1.5 @ 55 ml/hr.Tube feeding regimen at goal rate provides 1980 kcal (100% of needs), 82 grams of protein, and 1005 ml of H2O.   RD will continue to monitor for tolerance and ability to advance.  NUTRITION DIAGNOSIS:   Malnutrition (Severe) related to chronic illness, cancer and cancer related treatments as evidenced by energy intake < or equal to 75% for > or equal to 1 month, percent weight loss, mild depletion of body fat, severe depletion of muscle mass.  GOAL:   Patient will meet greater than or equal to 90% of their needs  MONITOR:   Labs, Weight trends, TF tolerance, I & O's, GOC  REASON FOR ASSESSMENT:   Consult Enteral/tube feeding initiation and management  ASSESSMENT:   81 y.o. female  with past medical history of metastatic ovarian cancer  admitted on 06/18/2016 with severe anemia, dehydration, malignant hypercalcemia, declining functional status.  Pt in room with daughter at bedside. RD spoke with RN prior to visit, NGT had just been placed for enteral feeds initiation. Per discussion with pt's daughter: patient has been eating poorly PTA x months. Pt has been subsisting on Ensure supplements but only consuming at the most 1 a day. During RD visit, MD visited patient. Reviewed plan with MD to start patient on trickle feeds of Osmolite 1.5. MD agreed with plan.  Nutrition-Focused physical exam completed. Findings are mild fat depletion, severe muscle depletion, and no edema. Per chart review, pt has lost 20 lb since September 2017 (13% wt loss x 7 months, significant for  time frame). Pt is at risk of refeeding syndrome given length of poor PO intake and malnutrition status.  Medications: IV Decadron daily, Marinol capsule BID, Remeron tablet daily, Multivitamin with minerals daily, Senokot-S tablet BID, IV Zofran PRN Labs reviewed: CBGs: 136 Low K  Diet Order:  Diet NPO time specified Except for: Sips with Meds  Skin:  Reviewed, no issues  Last BM:  4/23  Height:   Ht Readings from Last 1 Encounters:  06/18/16 5\' 6"  (1.676 m)    Weight:   Wt Readings from Last 1 Encounters:  06/18/16 135 lb (61.2 kg)    Ideal Body Weight:  59.1 kg  BMI:  Body mass index is 21.79 kg/m.  Estimated Nutritional Needs:   Kcal:  1850-2050  Protein:  85-95g  Fluid:  1.9L/day  EDUCATION NEEDS:   Education needs addressed  Clayton Bibles, MS, RD, LDN Pager: [phone] After Hours Pager: [phone]

## 2016-06-21 NOTE — Progress Notes (Signed)
Leslie Rowe   DOB:1935-07-27   PX#:[id number]    Subjective: The patient is evaluated several times. In the morning, she was noted to be very weak and was complaining of a lot of abdominal pain.  Her intake remained poor. I was paged to see her again when it was noted that the patient aspirated on drinking water.  Her intake is minimum.  Subsequently, multiple family members were around an extensive discussion ensued around goals of care The patient has finally come to realization that she might be too weak to go home.  Nursing staff expressed concern about mobility out of bed to go to the bathroom.  Objective:  Vitals:   06/20/16 2044 06/21/16 0433  BP: (!) 146/76 (!) 155/88  Pulse: 99 68  Resp: 18 18  Temp: 98.9 F (37.2 C) 98.4 F (36.9 C)     Intake/Output Summary (Last 24 hours) at 06/21/16 1115 Last data filed at 06/21/16 0736  Gross per 24 hour  Intake          1338.33 ml  Output                0 ml  Net          1338.33 ml    GENERAL:alert, no distress and comfortable SKIN: skin color, texture, turgor are normal, no rashes or significant lesions EYES: normal, Conjunctiva are pink and non-injected, sclera clear OROPHARYNX:no exudate, no erythema and lips, buccal mucosa, and tongue normal  NECK: supple, thyroid normal size, non-tender, without nodularity LYMPH:  no palpable lymphadenopathy in the cervical, axillary or inguinal LUNGS: clear to auscultation and percussion with normal breathing effort HEART: regular rate & rhythm and no murmurs and no lower extremity edema ABDOMEN:abdomen soft,mildly tender in her right lower quadrant and normal bowel sounds Musculoskeletal:no cyanosis of digits and no clubbing  NEURO: alert & oriented x 3 with fluent speech, no focal motor/sensory deficits   Labs:  Lab Results  Component Value Date   WBC 16.9 (H) 06/21/2016   HGB 9.0 (L) 06/21/2016   HCT 28.3 (L) 06/21/2016   MCV 76.3 (L) 06/21/2016   PLT 126 (L) 06/21/2016   NEUTROABS 14.7 (H) 06/21/2016    Lab Results  Component Value Date   NA 141 06/21/2016   K 3.3 (L) 06/21/2016   CL 106 06/21/2016   CO2 26 06/21/2016    Studies:  Dg Chest Port 1 View  Result Date: 06/21/2016 CLINICAL DATA:  Chest pain and shortness of Breath EXAM: PORTABLE CHEST 1 VIEW COMPARISON:  05/11/2016 FINDINGS: Cardiac shadow is stable. Right-sided chest wall port is again noted with the tip at the cavoatrial junction. Diffuse bilateral pulmonary nodularity is again identified and stable. IMPRESSION: Stable bilateral pulmonary nodules. No new focal infiltrate is noted. Electronically Signed   By: Inez Catalina M.D.   On: 06/21/2016 10:54    Assessment & Plan:   Ovarian cancer on right [facility]) The patient is very frail and is experiencing a lot of side effects from recent radiation treatment I have a long discussion with the daughter With her progressive decline in performance status, the patient is not a candidate for further palliative chemotherapy I will get palliative care to talk to the daughter and discussed options related to home-based palliative care versus residential hospice; however, it is clear to me neither the daughter or the patient is willing to sign up to hospice.  Anemia due to antineoplastic chemotherapy She had received 2 units of blood transfusion on  06/18/2016 Stable blood count.  Recheck in a few days  Malignant cachexia ([place]) Aspiration due to weakness Her appetite is very poor due to malignant cachexia and esophagitis She has not seen much benefit with Remeron Even with recent steroid treatment her appetite remained poor With ongoing nausea, I have added Marinol. With aspiration, thankfully, chest x-ray show no evidence of pneumonia I have placed her on n.p.o. except for medication We have further discussion about goals of care in relation to nutritional intake I did not order formal swallow evaluation because even if she needs thickened liquids,  it is unlikely she would have meaningful oral intake by herself We discussed about trial of artificial nutrition feeding through NG tube After discussing the risk and benefit of such intervention, the patient and her daughter has indicated they want to try feeding tube I would not place PEG tube due to presence of omental caking It is not even clear she can tolerate NG tube feeding a placement I will reassess again this evening Obviously, with the desire for artificial nutrition, the patient is not a hospice candidate  Metastasis to bone [facility]) She haspainful bone lesion. CT demonstrated T7 involvement. She has recently completed radiation treatment. She will continue pain medication prn  Controlled type 2 diabetes mellitus without complication ([place]) She will continue insulin treatment and I plan to hold oral hypoglycemics due to poor oral intake  Cancer associated pain and constipation She has intermittent abdominal pain, likely due to cancer It could also be contributed by constipation We discussed aggressive laxative therapy For now, she will continue taking narcotic prescription  Severe protein calorie malnutrition Severe nausea, resolving We will consult dietitian Nausea is multifactorial We will try Marinol As above, I will consult dietitian if NG feeding is successful  Malignant hypercalcemia Corrected serum calcium is high I will restart dexamethasone Continue IV fluid resuscitation  Intermittent confusion Likely due to malignant hypercalcemia Observe for now  CODE STATUS I have discussed with the patient's daughter I recommend DNR  Urinary retention and progressive weakness After much discussion, we agreed for Foley catheter placement  Goals of care I have extensive goals of care discussion with the patient and her daughter today She is noted to have progressive decline  However, it appears that both her daughter and the patient wants to continue  aggressive care and she would not qualify for hospice at this point  Discharge planning She is not ready for discharge If NG tube placement is successful, she would need continuous NG feeding for sometime before discharge home  I spent over 65 mins total contact time with the patient and family members today  Heath Lark, MD 06/21/2016  11:15 AM

## 2016-06-22 ENCOUNTER — Encounter (HOSPITAL_COMMUNITY): Payer: Self-pay

## 2016-06-22 LAB — BASIC METABOLIC PANEL
Anion gap: 10 (ref 5–15)
BUN: 16 mg/dL (ref 6–20)
CHLORIDE: 103 mmol/L (ref 101–111)
CO2: 26 mmol/L (ref 22–32)
Calcium: 9.3 mg/dL (ref 8.9–10.3)
Creatinine, Ser: 0.61 mg/dL (ref 0.44–1.00)
GFR calc Af Amer: >60 mL/min (ref >60)
GFR calc non Af Amer: >60 mL/min (ref >60)
GLUCOSE: 276 mg/dL — AB (ref 65–99)
POTASSIUM: 3.1 mmol/L — AB (ref 3.5–5.1)
SODIUM: 139 mmol/L (ref 135–145)

## 2016-06-22 LAB — GLUCOSE, CAPILLARY
GLUCOSE-CAPILLARY: 204 mg/dL — AB (ref 65–99)
GLUCOSE-CAPILLARY: 136 mg/dL — AB (ref 65–99)
Glucose-Capillary: 283 mg/dL — ABNORMAL HIGH (ref 65–99)
Glucose-Capillary: 191 mg/dL — ABNORMAL HIGH (ref 65–99)
Glucose-Capillary: 189 mg/dL — ABNORMAL HIGH (ref 65–99)

## 2016-06-22 LAB — MAGNESIUM: Magnesium: 1.6 mg/dL — ABNORMAL LOW (ref 1.7–2.4)

## 2016-06-22 LAB — PHOSPHORUS: Phosphorus: 2.6 mg/dL (ref 2.5–4.6)

## 2016-06-22 MED ORDER — SODIUM CHLORIDE 0.9 % IV SOLN
Freq: Once | INTRAVENOUS | Status: AC
  Administered 2016-06-22: 11:00:00 via INTRAVENOUS
  Filled 2016-06-22: qty 500

## 2016-06-22 MED ORDER — MAGNESIUM SULFATE 2 GM/50ML IV SOLN
2.0000 g | Freq: Once | INTRAVENOUS | Status: AC
  Administered 2016-06-22: 2 g via INTRAVENOUS
  Filled 2016-06-22: qty 50

## 2016-06-22 MED ORDER — INSULIN GLARGINE 100 UNIT/ML ~~LOC~~ SOLN
8.0000 [IU] | Freq: Every day | SUBCUTANEOUS | Status: DC
  Administered 2016-06-22: 8 [IU] via SUBCUTANEOUS
  Filled 2016-06-22 (×2): qty 0.08

## 2016-06-22 NOTE — Progress Notes (Signed)
Inpatient Diabetes Program Recommendations  AACE/ADA: New Consensus Statement on Inpatient Glycemic Control (2015)  Target Ranges:  Prepandial:   less than 140 mg/dL      Peak postprandial:   less than 180 mg/dL (1-2 hours)      Critically ill patients:  140 - 180 mg/dL   Lab Results  Component Value Date   GLUCAP 283 (H) 06/22/2016   HGBA1C 7.8 (H) 10/10/2015    Review of Glycemic Control  Started Lantus 8 units QAM today.   Inpatient Diabetes Program Recommendations:    Change Novolog to 0-15 units Q4H  Will continue to follow.  Thank you. Lorenda Peck, RD, LDN, CDE Inpatient Diabetes Coordinator [phone]

## 2016-06-22 NOTE — Progress Notes (Signed)
NT assisted pt to bathroom.  Pt requested NT leave her alone in BR.  NT explained concern for pt safety.  Pt refused further assistance.  Pt's daughter at bedside.  Per pt, NT left pt in BR alone for privacy. NT stayed in pt room but kept BR door closed. Pt given several minutes alone in BR. NT checked on pt to find her cleaning BM up off floor and toilet.  NT attempted to assist pt with cleaning but pt refused any help.  NT again expressed concern over pt's safety and asked that pt please accept help.  Pt refused and asked NT to leave.  NT remained in pt room until pt exited BR.  Once pt safety back to bed, bed alarm set, and NT reminded pt to call for help OOB.

## 2016-06-22 NOTE — Care Management Important Message (Signed)
Important Message  Patient Details  Name: ENVI EAGLESON MRN: [id number] Date of Birth: 14-Aug-1935   Medicare Important Message Given:  Yes    Kerin Salen 06/22/2016, 10:41 AMImportant Message  Patient Details  Name: CHRISHAWN KRING MRN: [id number] Date of Birth: 02-15-1936   Medicare Important Message Given:  Yes    Kerin Salen 06/22/2016, 10:41 AM

## 2016-06-22 NOTE — Progress Notes (Signed)
Nutrition Follow-up  DOCUMENTATION CODES:   Severe malnutrition in context of chronic illness  INTERVENTION:   Monitor magnesium, potassium, and phosphorus daily for at least 3 days, MD to replete as needed, as pt is at risk for refeeding syndrome given severe malnutrition and poor PO intake x months.  Continue Osmolite 1.5 @ 20 ml/hr via NGT.  *If loose stools become less frequent advance to 30 ml/hr.  *If loose stools continue, may need to to switch to a fiber containing formula.  Goal rate: Osmolite 1.5 @ 55 ml/hr.Tube feeding regimen at goal rate provides 1980 kcal (100% of needs), 82 grams of protein, and 1005 ml of H2O.   RD will continue to monitor for tolerance and ability to advance.  NUTRITION DIAGNOSIS:   Malnutrition (Severe) related to chronic illness, cancer and cancer related treatments as evidenced by energy intake < or equal to 75% for > or equal to 1 month, percent weight loss, mild depletion of body fat, severe depletion of muscle mass.  Ongoing.  GOAL:   Patient will meet greater than or equal to 90% of their needs  Progressing.  MONITOR:   Labs, Weight trends, TF tolerance, I & O's  ASSESSMENT:   81 y.o. female  with past medical history of metastatic ovarian cancer  admitted on 06/18/2016 with severe anemia, dehydration, malignant hypercalcemia, declining functional status.  Patient in room with family members at bedside. Pt resting. Pt's daughter states that the pt has had no nausea. Pt has had diarrhea overnight and once this morning per RN. Will continue Osmolite 1.5 @ 20 ml/hr and if tolerates for the rest of the day today with less frequent stools, can advance to 30 ml/hr. If frequent stools persist, will consider a fiber containing formula.   Medications: IV Decadron daily, Marinol capsule BID, Remeron tablet daily, Multivitamin with minerals daily, IV Zofran PRN Labs reviewed: Low K, Mg Phos WNL CBGs: 592-924  Diet Order:  Diet NPO time  specified Except for: Sips with Meds  Skin:  Reviewed, no issues  Last BM:  4/23  Height:   Ht Readings from Last 1 Encounters:  06/18/16 5\' 6"  (1.676 m)    Weight:   Wt Readings from Last 1 Encounters:  06/22/16 145 lb (65.8 kg)    Ideal Body Weight:  59.1 kg  BMI:  Body mass index is 23.4 kg/m.  Estimated Nutritional Needs:   Kcal:  1850-2050  Protein:  85-95g  Fluid:  1.9L/day  EDUCATION NEEDS:   Education needs addressed  Clayton Bibles, MS, RD, LDN Pager: [phone] After Hours Pager: [phone]

## 2016-06-22 NOTE — Progress Notes (Signed)
Leslie Rowe   DOB:04-28-1935   XV#:[id number]    Subjective: Noted nursing staff notes related to patient's refusal for assistance to get out of bed.  Foley catheter in situ. She had bowel movement overnight.  Abdominal pain is stable.  She had some mild nausea overnight but overall, able to tolerate slow NG feeding.  No reported fevers  Objective:  Vitals:   06/21/16 2058 06/22/16 0428  BP: (!) 168/64 137/90  Pulse: 95 (!) 121  Resp: 18 20  Temp: 99.3 F (37.4 C) 98 F (36.7 C)     Intake/Output Summary (Last 24 hours) at 06/22/16 5093 Last data filed at 06/22/16 0500  Gross per 24 hour  Intake          1267.32 ml  Output             1515 ml  Net          -247.68 ml    GENERAL:alert, no distress and comfortable SKIN: skin color, texture, turgor are normal, no rashes or significant lesions EYES: normal, Conjunctiva are pink and non-injected, sclera clear OROPHARYNX:no exudate, no erythema and lips, buccal mucosa, and tongue normal.  NG tube in situ NECK: supple, thyroid normal size, non-tender, without nodularity LYMPH:  no palpable lymphadenopathy in the cervical, axillary or inguinal LUNGS: clear to auscultation and percussion with normal breathing effort HEART: regular rate & rhythm and no murmurs and no lower extremity edema ABDOMEN:abdomen soft, non-tender and normal bowel sounds Musculoskeletal:no cyanosis of digits and no clubbing  NEURO: alert & oriented x 3 with fluent speech, no focal motor/sensory deficits   Labs:  Lab Results  Component Value Date   WBC 16.9 (H) 06/21/2016   HGB 9.0 (L) 06/21/2016   HCT 28.3 (L) 06/21/2016   MCV 76.3 (L) 06/21/2016   PLT 126 (L) 06/21/2016   NEUTROABS 14.7 (H) 06/21/2016    Lab Results  Component Value Date   NA 139 06/22/2016   K 3.1 (L) 06/22/2016   CL 103 06/22/2016   CO2 26 06/22/2016    Studies:  Dg Abd 1 View  Result Date: 06/21/2016 CLINICAL DATA:  Panda feeding tube repositioning. Weakness and in  adequate caloric intake. EXAM: ABDOMEN - 1 VIEW COMPARISON:  06/21/2016 FINDINGS: Based on positioning of the bowel, I expect that the stomach is considerably distended. The Panda type feeding tube projects in the expected location of the stomach antrum. IMPRESSION: 1. Distended stomach, with Panda feeding tube tip in the stomach antrum. Electronically Signed   By: Van Clines M.D.   On: 06/21/2016 18:05   Dg Abd 1 View  Result Date: 06/21/2016 CLINICAL DATA:  Enteric tube placement EXAM: ABDOMEN - 1 VIEW COMPARISON:  04/22/2016 CT abdomen/pelvis FINDINGS: Weighted enteric tube terminates in the proximal stomach. No disproportionately dilated small bowel loops. Minimal colonic stool. No evidence of pneumatosis or pneumoperitoneum. Re- demonstration of bibasilar pulmonary nodules. No radiopaque urolithiasis. IMPRESSION: Weighted enteric tube terminates in the proximal stomach. Nonobstructive bowel gas pattern. Re- demonstration of bibasilar pulmonary nodules. Electronically Signed   By: Ilona Sorrel M.D.   On: 06/21/2016 14:24   Dg Chest Port 1 View  Result Date: 06/21/2016 CLINICAL DATA:  Chest pain and shortness of Breath EXAM: PORTABLE CHEST 1 VIEW COMPARISON:  05/11/2016 FINDINGS: Cardiac shadow is stable. Right-sided chest wall port is again noted with the tip at the cavoatrial junction. Diffuse bilateral pulmonary nodularity is again identified and stable. IMPRESSION: Stable bilateral pulmonary nodules. No new focal infiltrate  is noted. Electronically Signed   By: Inez Catalina M.D.   On: 06/21/2016 10:54    Assessment & Plan:   Ovarian cancer on right [facility]) The patient is very frail and is experiencing a lot of side effects from recent radiation treatment I have a long discussion with the daughter With her progressive decline in performance status, the patient is not a candidate for further palliative chemotherapy I will get palliative care to talk to the daughter and discussed options  related to home-based palliative care versus residential hospice; however, it is clear to me neither the daughter or the patient is willing to sign up to hospice.  Anemia due to antineoplastic chemotherapy She had received 2 units of blood transfusion on 06/18/2016 Stable blood count.  Recheck in a few days  Malignant cachexia ([place]) Aspiration due to weakness Her appetite is very poor due to malignant cachexia and esophagitis She has not seen much benefit with Remeron Even with recent steroid treatment her appetite remained poor With ongoing nausea, I have added Marinol. With aspiration, thankfully, chest x-ray show no evidence of pneumonia I have placed her on n.p.o. except for medication; I have discontinued a lot of oral medications We have further discussion about goals of care in relation to nutritional intake I did not order formal swallow evaluation because even if she needs thickened liquids, it is unlikely she would have meaningful oral intake by herself We discussed about trial of artificial nutrition feeding through NG tube After discussing the risk and benefit of such intervention, the patient and her daughter has indicated they want to try feeding tube.  NG tube was placed on 06/21/2016 I would not place PEG tube due to presence of omental caking Obviously, with the desire for artificial nutrition, the patient is not a hospice candidate  Metastasis to bone [facility]) She haspainful bone lesion. CT demonstrated T7 involvement. She has recently completed radiation treatment. She will continue pain medication prn  Controlled type 2 diabetes mellitus without complication ([place]) She will continue insulin treatment and I plan to hold oral hypoglycemics due to poor oral intake I will add insulin Lantus in view of steroids induced hyperglycemia  Cancer associated pain and constipation She has intermittent abdominal pain, likely due to cancer It could also be contributed by  constipation We discussed aggressive laxative therapy For now, she will continue taking narcotic prescription  Severe protein calorie malnutrition Severe nausea, resolving We will consult dietitian Nausea is multifactorial We will try Marinol We will continue NG feeding as tolerated.  She is at high risk of refeeding syndrome  Significant electrolyte imbalance with hypomagnesemia and hypokalemia Likely due to refeeding syndrome and poor oral intake We will replace intravenously  Malignant hypercalcemia Corrected serum calcium is high I will restart dexamethasone Continue IV fluid resuscitation  Intermittent confusion Likely due to malignant hypercalcemia Observe for now  CODE STATUS I have discussed with the patient's daughter I recommend DNR  Urinary retention and progressive weakness After much discussion, we agreed for Foley catheter placement  Goals of care I have extensive goals of care discussion with the patient and her daughter on multiple occasions She is noted to have progressive decline  However, it appears that both her daughter and the patient wants to continue aggressive care and she would not qualify for hospice at this point  Discharge planning She is not ready for discharge She would need continuous NG feeding for sometime before discharge home, likely at the end of the week  once she has no further signs of refeeding syndrome  Heath Lark, MD 06/22/2016  8:23 AM

## 2016-06-23 ENCOUNTER — Other Ambulatory Visit: Payer: Medicare Other

## 2016-06-23 ENCOUNTER — Ambulatory Visit: Payer: Medicare Other | Admitting: Hematology and Oncology

## 2016-06-23 ENCOUNTER — Ambulatory Visit: Payer: Medicare Other

## 2016-06-23 DIAGNOSIS — E43 Unspecified severe protein-calorie malnutrition: Secondary | ICD-10-CM

## 2016-06-23 DIAGNOSIS — C7951 Secondary malignant neoplasm of bone: Secondary | ICD-10-CM

## 2016-06-23 DIAGNOSIS — R410 Disorientation, unspecified: Secondary | ICD-10-CM

## 2016-06-23 DIAGNOSIS — R110 Nausea: Secondary | ICD-10-CM

## 2016-06-23 DIAGNOSIS — D6481 Anemia due to antineoplastic chemotherapy: Secondary | ICD-10-CM

## 2016-06-23 DIAGNOSIS — K5900 Constipation, unspecified: Secondary | ICD-10-CM

## 2016-06-23 DIAGNOSIS — E878 Other disorders of electrolyte and fluid balance, not elsewhere classified: Secondary | ICD-10-CM

## 2016-06-23 DIAGNOSIS — E876 Hypokalemia: Secondary | ICD-10-CM

## 2016-06-23 DIAGNOSIS — R339 Retention of urine, unspecified: Secondary | ICD-10-CM

## 2016-06-23 DIAGNOSIS — E119 Type 2 diabetes mellitus without complications: Secondary | ICD-10-CM

## 2016-06-23 DIAGNOSIS — C561 Malignant neoplasm of right ovary: Principal | ICD-10-CM

## 2016-06-23 DIAGNOSIS — R531 Weakness: Secondary | ICD-10-CM

## 2016-06-23 DIAGNOSIS — G893 Neoplasm related pain (acute) (chronic): Secondary | ICD-10-CM

## 2016-06-23 LAB — BASIC METABOLIC PANEL
Anion gap: 6 (ref 5–15)
BUN: 16 mg/dL (ref 6–20)
CO2: 27 mmol/L (ref 22–32)
Calcium: 9.2 mg/dL (ref 8.9–10.3)
Chloride: 108 mmol/L (ref 101–111)
Creatinine, Ser: 0.55 mg/dL (ref 0.44–1.00)
Glucose, Bld: 216 mg/dL — ABNORMAL HIGH (ref 65–99)
POTASSIUM: 3.2 mmol/L — AB (ref 3.5–5.1)
Sodium: 141 mmol/L (ref 135–145)

## 2016-06-23 LAB — GLUCOSE, CAPILLARY
GLUCOSE-CAPILLARY: 202 mg/dL — AB (ref 65–99)
Glucose-Capillary: 182 mg/dL — ABNORMAL HIGH (ref 65–99)
Glucose-Capillary: 228 mg/dL — ABNORMAL HIGH (ref 65–99)
Glucose-Capillary: 258 mg/dL — ABNORMAL HIGH (ref 65–99)
Glucose-Capillary: 202 mg/dL — ABNORMAL HIGH (ref 65–99)
Glucose-Capillary: 161 mg/dL — ABNORMAL HIGH (ref 65–99)

## 2016-06-23 LAB — MAGNESIUM: Magnesium: 1.8 mg/dL (ref 1.7–2.4)

## 2016-06-23 LAB — PHOSPHORUS: PHOSPHORUS: 2.3 mg/dL — AB (ref 2.5–4.6)

## 2016-06-23 MED ORDER — OSMOLITE 1.5 CAL PO LIQD
1000.0000 mL | ORAL | Status: DC
  Administered 2016-06-23: 1000 mL
  Filled 2016-06-23 (×2): qty 1000

## 2016-06-23 MED ORDER — INSULIN GLARGINE 100 UNIT/ML ~~LOC~~ SOLN
10.0000 [IU] | Freq: Every day | SUBCUTANEOUS | Status: DC
  Administered 2016-06-23: 10 [IU] via SUBCUTANEOUS
  Filled 2016-06-23 (×2): qty 0.1

## 2016-06-23 MED ORDER — POTASSIUM PHOSPHATES 15 MMOLE/5ML IV SOLN: 30.0000 mmol | Freq: Once | INTRAVENOUS | Status: DC

## 2016-06-23 MED ORDER — PANTOPRAZOLE SODIUM 40 MG IV SOLR
40.0000 mg | INTRAVENOUS | Status: DC
  Administered 2016-06-23 – 2016-06-28 (×6): 40 mg via INTRAVENOUS
  Filled 2016-06-23 (×7): qty 40

## 2016-06-23 MED ORDER — POTASSIUM PHOSPHATES 15 MMOLE/5ML IV SOLN
30.0000 mmol | Freq: Once | INTRAVENOUS | Status: AC
  Administered 2016-06-23: 30 mmol via INTRAVENOUS
  Filled 2016-06-23: qty 10

## 2016-06-23 NOTE — Progress Notes (Signed)
Daily Progress Note   Patient Name: Leslie Rowe       Date: 06/23/2016 DOB: January 07, 1936  Age: 81 y.o. MRN#: [id number] Attending Physician: Heath Lark, MD Primary Care Physician: Hoyt Koch, MD Admit Date: 06/18/2016  Reason for Consultation/Follow-up: Establishing goals of care  Subjective: Leslie Rowe is resting in bed.  I introduced myself and that I work with from palliative medicine.    She reports not really wanting to talk today.  She does note that her family is "more optimistic than I am" at this point.  Length of Stay: 5  Current Medications: Scheduled Meds:  . dexamethasone  4 mg Intravenous Daily  . enoxaparin (LOVENOX) injection  40 mg Subcutaneous Q24H  . insulin aspart  0-24 Units Subcutaneous TID WC  . insulin glargine  10 Units Subcutaneous Daily  . lidocaine-prilocaine   Topical Once  . pantoprazole (PROTONIX) IV  40 mg Intravenous Q24H    Continuous Infusions: . sodium chloride 1,000 mL (06/23/16 1543)  . feeding supplement (OSMOLITE 1.5 CAL) 1,000 mL (06/23/16 2119)  . ondansetron (ZOFRAN) IV      PRN Meds: acetaminophen, LORazepam, morphine injection, ondansetron **OR** ondansetron **OR** ondansetron (ZOFRAN) IV **OR** ondansetron (ZOFRAN) IV, polyethylene glycol  Physical Exam         NAD Weak frail Breathing regular pattern  asleep, arouses breifly but falls back to sleep.   Vital Signs: BP (!) 171/85 (BP Location: Left Arm)   Pulse 94   Temp 99.5 F (37.5 C) (Oral)   Resp 20   Ht 5\' 6"  (1.676 m)   Wt 64.4 kg (142 lb)   SpO2 97%   BMI 22.92 kg/m  SpO2: SpO2: 97 % O2 Device: O2 Device: Not Delivered O2 Flow Rate:    Intake/output summary:   Intake/Output Summary (Last 24 hours) at 06/23/16 2236 Last data filed at 06/23/16 1908  Gross per 24 hour  Intake          2182.16 ml  Output             1700 ml  Net           482.16 ml   LBM: Last BM Date: 06/23/16 Baseline Weight: Weight: 61.2 kg (135 lb) Most recent weight: Weight: 64.4 kg (142 lb)  Palliative Assessment/Data:    Flowsheet Rows     Most Recent Value  Intake Tab  Referral Department  Oncology  Unit at Time of Referral  Oncology Unit  Palliative Care Primary Diagnosis  Cancer  Palliative Care Type  New Palliative care  Reason for referral  Clarify Goals of Care  Date first seen by Palliative Care  06/19/16  Clinical Assessment  Palliative Performance Scale Score  20%  Pain Max last 24 hours  4  Pain Min Last 24 hours  3  Dyspnea Max Last 24 Hours  4  Dyspnea Min Last 24 hours  3  Nausea Max Last 24 Hours  4  Nausea Min Last 24 Hours  3  Psychosocial & Spiritual Assessment  Palliative Care Outcomes  Patient/Family meeting held?  Yes  Who was at the meeting?  daughter and friend.   Palliative Care Outcomes  Clarified goals of care      Patient Active Problem List   Diagnosis Date Noted  . Cancer associated pain 06/02/2016  . UTI (urinary tract infection) 05/26/2016  . Malignant cachexia ([place]) 05/26/2016  . Hypercalcemia 05/11/2016  . Metastasis to bone ([place]) 04/29/2016  . Goals of care, counseling/discussion 04/29/2016  . Pancreatic lesion 04/29/2016  . Iron deficiency anemia 04/15/2016  . Chemotherapy-induced peripheral neuropathy ([place]) 03/19/2016  . International Federation of Gyne[employer] and Obstetrics (FIGO) stage IVB epithelial ovarian cancer ([place]) 02/28/2016  . Genetic testing 02/25/2016  . Family history of breast cancer in sister 01/27/2016  . Anemia due to antineoplastic chemotherapy 01/20/2016  . Port catheter in place 01/08/2016  . Chemotherapy induced neutropenia ([place]) 12/27/2015  . Malignant neoplasm metastatic to lung ([place]) 12/20/2015  . Ovarian cancer on right ([place]) 12/20/2015  . Pleural effusion 12/20/2015    . Pulmonary nodules/lesions, multiple 12/07/2015  . Peritoneal carcinomatosis ([place]) 12/07/2015  . Primary peritoneal carcinomatosis ([place]) 12/03/2015  . Lower abdominal pain 10/10/2015  . GERD (gastroesophageal reflux disease) 04/16/2014  . Memory loss 12/06/2012  . Routine health maintenance 10/09/2011  . Controlled type 2 diabetes mellitus without complication ([place]) 76/72/0947  . Hyperlipidemia 12/21/2006  . Essential hypertension 12/21/2006    Palliative Care Assessment & Plan   Patient Profile:    Assessment:  ovarian cancer Frailty Functional decline Malignant cachexia Malignant pain Constipation Malignant hypercalcemia  Recommendations/Plan:  Goals of care: Leslie Rowe is unable/unwilling to discuss with me this afternoon.    We will continue efforts to have gentle honest conversations with patient and her family with regards to next steps and overall goals of care Continue current mode of care for now.    Code Status:    Code Status Orders        Start     Ordered   06/18/16 1705  Do not attempt resuscitation (DNR)  Continuous    Question Answer Comment  In the event of cardiac or respiratory ARREST Do not call a "code blue"   In the event of cardiac or respiratory ARREST Do not perform Intubation, CPR, defibrillation or ACLS   In the event of cardiac or respiratory ARREST Use medication by any route, position, wound care, and other measures to relive pain and suffering. May use oxygen, suction and manual treatment of airway obstruction as needed for comfort.      06/18/16 1706    Code Status History    Date Active Date Inactive Code Status Order ID Comments User Context   05/11/2016 10:35 AM 05/13/2016  6:54 PM Full Code [id number]  Heath Lark, MD Inpatient    Advance Directive Documentation     Most Recent Value  Type of Advance Directive  Healthcare Power of Attorney  Pre-existing out of facility DNR order (yellow form or pink MOST form)  -  "MOST" Form in  Place?  -       Prognosis:   guarded.   Discharge Planning:  To Be Determined  Care plan was discussed with  Patient's sister and sister in law present in the room  Thank you for allowing the Palliative Medicine Team to assist in the care of this patient.   Time In: 1615 Time Out: 1630 Total Time 15 Prolonged Time Billed  no       Greater than 50%  of this time was spent counseling and coordinating care related to the above assessment and plan.  Micheline Rough, MD   Please contact Palliative Medicine Team phone at [phone] for questions and concerns.

## 2016-06-23 NOTE — Care Management Note (Signed)
Case Management Note  Patient Details  Name: JOVAN SCHICKLING MRN: [id number] Date of Birth: 02-Mar-1935  Subjective/Objective:   81 yo admitted with Ovarian cancer.                 Action/Plan: Pt from home with daughter and to return there at discharge. Choice offered for home health services for new tube feeds and [facility] chosen. Pt will need orders for [facility], tube feeding pump and tube feeds. Pt could potentially benefit from PT eval to assist with DC planning.  Expected Discharge Date:                  Expected Discharge Plan:  [place]  In-House Referral:     Discharge planning Services  CM Consult  Post Acute Care Choice:  Home Health Choice offered to:  Adult Children  DME Arranged:    DME Agency:  [place] Arranged:  RN [place] Agency:  [facility] (now [facility])  Status of Service:  In process, will continue to follow  If discussed at Long Length of Stay Meetings, dates discussed:    Additional CommentsLynnell Catalan, RN 06/23/2016, 10:14 AM  [phone]

## 2016-06-23 NOTE — Progress Notes (Signed)
Nutrition Follow-up  DOCUMENTATION CODES:   Severe malnutrition in context of chronic illness  INTERVENTION:   Monitor magnesium, potassium, and phosphorus daily for at least 3 days, MD to replete as needed, as pt is at risk for refeeding syndrome given severe malnutrition and poor PO intake x months.  Continue Osmolite 1.5 @ 30 ml/hr via NGT, advance by 10 ml every 24 hours to goal rate of 55 ml/hr. Tube feeding regimen at goal rate provides 1980kcal (100% of needs), 82grams of protein, and 1058ml of H2O.   If IVF discontinued, recommend free water flushes of 200 ml QID (800 ml total)  RD will continue to monitor for tolerance and ability to advance.  NUTRITION DIAGNOSIS:   Malnutrition (Severe) related to chronic illness, cancer and cancer related treatments as evidenced by energy intake < or equal to 75% for > or equal to 1 month, percent weight loss, mild depletion of body fat, severe depletion of muscle mass.  Ongoing.  GOAL:   Patient will meet greater than or equal to 90% of their needs  Progressing.  MONITOR:   Labs, Weight trends, TF tolerance, I & O's  ASSESSMENT:   81 y.o. female  with past medical history of metastatic ovarian cancer  admitted on 06/18/2016 with severe anemia, dehydration, malignant hypercalcemia, declining functional status.  Patient in room with family at bedside. Pt's loose stools improved from yesterday. Per daughter, pt had one BM this morning that was firmer. Pt now receiving Osmolite 1.5 @ 30 ml/hr, will continue to advance towards goal every 24 hours. Reviewed plan with family and RN.  Medications: IV Decadron daily, IV Protonix daily, IV K-PHOS once, IV Zofran PRN Labs reviewed: CBGs: 228-258 Low K, Phos Mg WNL  Diet Order:  Diet NPO time specified Except for: Sips with Meds  Skin:  Reviewed, no issues  Last BM:  4/23  Height:   Ht Readings from Last 1 Encounters:  06/18/16 5\' 6"  (1.676 m)    Weight:   Wt Readings from  Last 1 Encounters:  06/23/16 142 lb (64.4 kg)    Ideal Body Weight:  59.1 kg  BMI:  Body mass index is 22.92 kg/m.  Estimated Nutritional Needs:   Kcal:  1850-2050  Protein:  85-95g  Fluid:  1.9L/day  EDUCATION NEEDS:   Education needs addressed  Clayton Bibles, MS, RD, LDN Pager: [phone] After Hours Pager: [phone]

## 2016-06-23 NOTE — Progress Notes (Signed)
Leslie Rowe   DOB:25-May-1935   QV#:[id number]    Subjective: She is still complaining of abdominal pain.  No recent diarrhea.  She has some mild nausea but no vomiting.  She is able to tolerate NG feeding at 30 cc an hour  Objective:  Vitals:   06/22/16 2205 06/23/16 0500  BP: 135/78 138/74  Pulse: 93 96  Resp: 16 16  Temp: 98.6 F (37 C) 98.6 F (37 C)     Intake/Output Summary (Last 24 hours) at 06/23/16 3329 Last data filed at 06/23/16 5188  Gross per 24 hour  Intake          1810.67 ml  Output             1325 ml  Net           485.67 ml    GENERAL:alert, no distress and comfortable SKIN: skin color, texture, turgor are normal, no rashes or significant lesions EYES: normal, Conjunctiva are pink and non-injected, sclera clear OROPHARYNX:no exudate, no erythema and lips, buccal mucosa, and tongue normal  NECK: supple, thyroid normal size, non-tender, without nodularity LYMPH:  no palpable lymphadenopathy in the cervical, axillary or inguinal LUNGS: clear to auscultation and percussion with normal breathing effort HEART: regular rate & rhythm and no murmurs and no lower extremity edema ABDOMEN:abdomen soft, mild tenderness on palpation in the epigastric region, reduced bowel sounds Musculoskeletal:no cyanosis of digits and no clubbing  NEURO: alert & oriented x 3 with fluent speech, no focal motor/sensory deficits   Labs:  Lab Results  Component Value Date   WBC 16.9 (H) 06/21/2016   HGB 9.0 (L) 06/21/2016   HCT 28.3 (L) 06/21/2016   MCV 76.3 (L) 06/21/2016   PLT 126 (L) 06/21/2016   NEUTROABS 14.7 (H) 06/21/2016    Lab Results  Component Value Date   NA 141 06/23/2016   K 3.2 (L) 06/23/2016   CL 108 06/23/2016   CO2 27 06/23/2016    Studies:  Dg Abd 1 View  Result Date: 06/21/2016 CLINICAL DATA:  Panda feeding tube repositioning. Weakness and in adequate caloric intake. EXAM: ABDOMEN - 1 VIEW COMPARISON:  06/21/2016 FINDINGS: Based on positioning of the  bowel, I expect that the stomach is considerably distended. The Panda type feeding tube projects in the expected location of the stomach antrum. IMPRESSION: 1. Distended stomach, with Panda feeding tube tip in the stomach antrum. Electronically Signed   By: Van Clines M.D.   On: 06/21/2016 18:05   Dg Abd 1 View  Result Date: 06/21/2016 CLINICAL DATA:  Enteric tube placement EXAM: ABDOMEN - 1 VIEW COMPARISON:  04/22/2016 CT abdomen/pelvis FINDINGS: Weighted enteric tube terminates in the proximal stomach. No disproportionately dilated small bowel loops. Minimal colonic stool. No evidence of pneumatosis or pneumoperitoneum. Re- demonstration of bibasilar pulmonary nodules. No radiopaque urolithiasis. IMPRESSION: Weighted enteric tube terminates in the proximal stomach. Nonobstructive bowel gas pattern. Re- demonstration of bibasilar pulmonary nodules. Electronically Signed   By: Ilona Sorrel M.D.   On: 06/21/2016 14:24   Dg Chest Port 1 View  Result Date: 06/21/2016 CLINICAL DATA:  Chest pain and shortness of Breath EXAM: PORTABLE CHEST 1 VIEW COMPARISON:  05/11/2016 FINDINGS: Cardiac shadow is stable. Right-sided chest wall port is again noted with the tip at the cavoatrial junction. Diffuse bilateral pulmonary nodularity is again identified and stable. IMPRESSION: Stable bilateral pulmonary nodules. No new focal infiltrate is noted. Electronically Signed   By: Inez Catalina M.D.   On: 06/21/2016  10:54    Assessment & Plan:   Ovarian cancer on right [facility]) The patient is very frail and is experiencing a lot of side effects from recent radiation treatment I have a long discussion with the daughter With her progressive decline in performance status, the patient is not a candidate for further palliative chemotherapy I will get palliative care to talk to the daughter and discussed options related to home-based palliative care versus residential hospice; however, it is clear to me neither the  daughter or the patient is willing to sign up to hospice.  Anemia due to antineoplastic chemotherapy She had received 2 units of blood transfusion on 06/18/2016 Stable blood count. Recheck in a few days  Malignant cachexia ([place]) Aspiration due to weakness Her appetite is very poor due to malignant cachexia and esophagitis She has not seen much benefit with Remeron or Marinol, will stop Even with recent steroid treatment her appetite remained poor With aspiration, thankfully, chestx-ray showed no evidence of pneumonia I have placed her on n.p.o. except for medication; I have discontinued a lot of oral medications We have further discussion about goals of care in relation to nutritional intake I did not order formal swallow evaluation because even if she needs thickened liquids, it is unlikely she would have meaningful oral intake by herself We discussed about trial of artificial nutrition feeding through NG tube After discussing the risk and benefit of such intervention, the patient and her daughter has indicated they want to try feeding tube.  NG tube was placed on 06/21/2016 I would not place PEG tube due to presence of omental caking Obviously, with the desire for artificial nutrition, the patient is not a hospice candidate  Metastasis to bone [facility]) She haspainful bone lesion. CT demonstrated T7 involvement. She has recently completed radiation treatment. She will continue pain medication prn  Controlled type 2 diabetes mellitus without complication ([place]) She will continue insulin treatment and I plan to hold oral hypoglycemics due to poor oral intake I have added insulin Lantus in view of steroids induced hyperglycemia, titrating up the dose today  Cancer associated pain and constipation She has intermittent abdominal pain, likely due to cancer It could also be contributed by constipation We discussed aggressive laxative therapy For now, she will continue taking narcotic  prescription  Severe protein calorie malnutrition Severe nausea, ?component of gastritis We will consult dietitian Nausea is multifactorial, will add IV protonix We will continue NG feeding as tolerated.  She is at high risk of refeeding syndrome  Significant electrolyte imbalance with hypomagnesemia, hypophosphatemia and hypokalemia, refeeding syndrome Likely due to refeeding syndrome and poor oral intake We will replace intravenously  Malignant hypercalcemia Corrected serum calcium is high I will continue dexamethasone Continue IV fluid resuscitation  Intermittent confusion Likely due to malignant hypercalcemia Observe for now  CODE STATUS I have discussed with the patient's daughter I recommend DNR  Urinary retention and progressive weakness After much discussion, we agreed for Foley catheter placement  Goals of care I have extensive goals of care discussion with the patient and her daughter on multiple occasions She is noted to have progressive decline  However, it appears that both her daughter and the patient wants to continue aggressive care and she would not qualify for hospice at this point  Discharge planning She is not ready for discharge She would need continuous NG feeding for sometime before discharge home, likely at the end of the week once she has no further signs of refeeding syndrome  Heath Lark, MD 06/23/2016  9:37 AM

## 2016-06-24 DIAGNOSIS — Z7189 Other specified counseling: Secondary | ICD-10-CM

## 2016-06-24 LAB — GLUCOSE, CAPILLARY
GLUCOSE-CAPILLARY: 202 mg/dL — AB (ref 65–99)
GLUCOSE-CAPILLARY: 203 mg/dL — AB (ref 65–99)
GLUCOSE-CAPILLARY: 231 mg/dL — AB (ref 65–99)
GLUCOSE-CAPILLARY: 257 mg/dL — AB (ref 65–99)
Glucose-Capillary: 252 mg/dL — ABNORMAL HIGH (ref 65–99)
Glucose-Capillary: 268 mg/dL — ABNORMAL HIGH (ref 65–99)
Glucose-Capillary: 198 mg/dL — ABNORMAL HIGH (ref 65–99)

## 2016-06-24 LAB — BASIC METABOLIC PANEL
ANION GAP: 7 (ref 5–15)
BUN: 14 mg/dL (ref 6–20)
CALCIUM: 9.5 mg/dL (ref 8.9–10.3)
CO2: 27 mmol/L (ref 22–32)
CREATININE: 0.59 mg/dL (ref 0.44–1.00)
Chloride: 105 mmol/L (ref 101–111)
GFR calc Af Amer: >60 mL/min (ref >60)
GFR calc non Af Amer: >60 mL/min (ref >60)
Glucose, Bld: 216 mg/dL — ABNORMAL HIGH (ref 65–99)
Potassium: 3.7 mmol/L (ref 3.5–5.1)
SODIUM: 139 mmol/L (ref 135–145)

## 2016-06-24 LAB — MAGNESIUM: MAGNESIUM: 1.6 mg/dL — AB (ref 1.7–2.4)

## 2016-06-24 LAB — PHOSPHORUS: Phosphorus: 2.4 mg/dL — ABNORMAL LOW (ref 2.5–4.6)

## 2016-06-24 MED ORDER — MAGNESIUM SULFATE 2 GM/50ML IV SOLN
2.0000 g | Freq: Once | INTRAVENOUS | Status: AC
  Administered 2016-06-24: 2 g via INTRAVENOUS
  Filled 2016-06-24: qty 50

## 2016-06-24 MED ORDER — POTASSIUM PHOSPHATES 15 MMOLE/5ML IV SOLN
30.0000 mmol | Freq: Once | INTRAVENOUS | Status: AC
  Administered 2016-06-24: 30 mmol via INTRAVENOUS
  Filled 2016-06-24: qty 10

## 2016-06-24 MED ORDER — INSULIN GLARGINE 100 UNIT/ML ~~LOC~~ SOLN
12.0000 [IU] | Freq: Every day | SUBCUTANEOUS | Status: DC
  Administered 2016-06-24: 12 [IU] via SUBCUTANEOUS
  Filled 2016-06-24 (×2): qty 0.12

## 2016-06-24 NOTE — Progress Notes (Signed)
Leslie Rowe   DOB:06-Dec-1935   YT#:[id number]    Subjective: The patient appears sleepy.  She complained of mild abdominal discomfort.  Nausea is slightly better.  She has very little interaction with staff and has been sleeping most of the time.  Family friend is present  Objective:  Vitals:   06/23/16 2032 06/24/16 0709  BP: (!) 171/85 (!) 142/83  Pulse: 94 (!) 118  Resp: 20 20  Temp: 99.5 F (37.5 C) 99.8 F (37.7 C)     Intake/Output Summary (Last 24 hours) at 06/24/16 3557 Last data filed at 06/24/16 0320  Gross per 24 hour  Intake           724.16 ml  Output             1500 ml  Net          -775.84 ml    GENERAL:alert, no distress and comfortable SKIN: skin color, texture, turgor are normal, no rashes or significant lesions EYES: normal, Conjunctiva are pink and non-injected, sclera clear OROPHARYNX:no exudate, no erythema and lips, buccal mucosa, and tongue normal  NECK: supple, thyroid normal size, non-tender, without nodularity LYMPH:  no palpable lymphadenopathy in the cervical, axillary or inguinal LUNGS: clear to auscultation and percussion with normal breathing effort HEART: regular rate & rhythm and no murmurs and no lower extremity edema ABDOMEN:abdomen soft, mild tenderness in the epigastrium, with reduced bowel sounds Musculoskeletal:no cyanosis of digits and no clubbing  NEURO: alert & oriented x 3 with fluent speech, no focal motor/sensory deficits   Labs:  Lab Results  Component Value Date   WBC 16.9 (H) 06/21/2016   HGB 9.0 (L) 06/21/2016   HCT 28.3 (L) 06/21/2016   MCV 76.3 (L) 06/21/2016   PLT 126 (L) 06/21/2016   NEUTROABS 14.7 (H) 06/21/2016    Lab Results  Component Value Date   NA 139 06/24/2016   K 3.7 06/24/2016   CL 105 06/24/2016   CO2 27 06/24/2016    Assessment & Plan:   Ovarian cancer on right [facility]) The patient is very frail and is experiencing a lot of side effects from recent radiation treatment I have a long discussion  with the daughter With her progressive decline in performance status, the patient is not a candidate for further palliative chemotherapy I will get palliative care to talk to the daughter and discussed options related to home-based palliative care versus residential hospice; however, it is clear to me neither the daughter or the patient is willing to sign up to hospice.  Anemia due to antineoplastic chemotherapy She had received 2 units of blood transfusion on 06/18/2016 Stable blood count. Recheck tomorrow  Malignant cachexia ([place]) Aspiration due to weakness Her appetite is very poor due to malignant cachexia and esophagitis She has not seen much benefit with Remeron or Marinol, will stop Even with recent steroid treatment her appetite remained poor. With aspiration, thankfully, chestx-ray showed no evidence of pneumonia I have placed her on n.p.o. except for medication; I have discontinued a lot of oral medications She is getting most of medications intravenously We have further discussion about goals of care in relation to nutritional intake I did not order formal swallow evaluation because even if she needs thickened liquids, it is unlikely she would have meaningful oral intake by herself We discussed about trial of artificial nutrition feeding through NG tube After discussing the risk and benefit of such intervention, the patient and her daughter has indicated they want to try  feeding tube. NG tube was placed on 06/21/2016 I would not place PEG tube due to presence of omental caking Obviously, with the desire for artificial nutrition, the patient is not a hospice candidate Titrating up goals of nutritional support up to 55 mils per hour  Metastasis to bone [facility]) She haspainful bone lesion. CT demonstrated T7 involvement. She has recently completed radiation treatment. She will continue pain medication prn  Controlled type 2 diabetes mellitus without complication ([place]) She  will continue insulin treatment and I plan to hold oral hypoglycemics due to poor oral intake I have added insulin Lantus in view of steroids inducedhyperglycemia, titrating up the dose today  Cancer associated pain and constipation She has intermittent abdominal pain, likely due to cancer It could also be contributed by constipation We discussed aggressive laxative therapy For now, she will continue taking narcotic prescription  Severe protein calorie malnutrition Severe nausea, ?component of gastritis We will consult dietitian Nausea is multifactorial, will add IV protonix We will continue NG feeding as tolerated. She is at high risk of refeeding syndrome  Significant electrolyte imbalance with hypomagnesemia, hypophosphatemia and hypokalemia, refeeding syndrome Likely due to refeeding syndrome and poor oral intake We will replace intravenously  Malignant hypercalcemia Corrected serum calcium is high I will continue dexamethasone Continue IV fluid resuscitation  Intermittent confusion Likely due to malignant hypercalcemia Observe for now  CODE STATUS I have discussed with the patient's daughter I recommend DNR  Urinary retention and progressive weakness After much discussion, we agreed for Foley catheter placement  Goals of care I have extensive goals of care discussion with the patient and her daughter on multiple occasions She is noted to have progressive decline  However, it appears that both her daughter and the patient wants to continue aggressive care and she would not qualify for hospice at this point I will set up another family meeting tomorrow morning on 06/25/2016 to address goals of care again  Discharge planning She is not ready for discharge She would need continuous NG feeding for sometime before discharge home, likely next week.  She has signs of refeeding syndrome and is dependent on intravenous replacement therapy for electrolyte imbalance Leslie Lark, MD 06/24/2016  7:17 AM

## 2016-06-24 NOTE — Progress Notes (Signed)
Spoke to MD Burr Medico on call for oncology r/t patient CBG at 2006 of 231, there are no bedtime sliding scale insulin orders, Per MD recheck between 2200-2300 tonight and if CBG greater than 200 call back to obtain sliding scale insulin order. Will continue to monitor.

## 2016-06-24 NOTE — Progress Notes (Signed)
Daily Progress Note   Patient Name: Leslie Rowe       Date: 06/24/2016 DOB: Nov 07, 1935  Age: 81 y.o. MRN#: [id number] Attending Physician: Heath Lark, MD Primary Care Physician: Hoyt Koch, MD Admit Date: 06/18/2016  Reason for Consultation/Follow-up: Establishing goals of care  Subjective: Leslie Rowe is resting in bed.  I met today with her and her daughter.  See below.  Length of Stay: 6  Current Medications: Scheduled Meds:  . dexamethasone  4 mg Intravenous Daily  . enoxaparin (LOVENOX) injection  40 mg Subcutaneous Q24H  . insulin aspart  0-24 Units Subcutaneous TID WC  . insulin glargine  12 Units Subcutaneous Daily  . lidocaine-prilocaine   Topical Once  . pantoprazole (PROTONIX) IV  40 mg Intravenous Q24H    Continuous Infusions: . sodium chloride 1,000 mL (06/23/16 1543)  . feeding supplement (OSMOLITE 1.5 CAL) 1,000 mL (06/23/16 2119)  . ondansetron (ZOFRAN) IV    . potassium phosphate IVPB (mmol) 30 mmol (06/24/16 1021)    PRN Meds: acetaminophen, LORazepam, morphine injection, ondansetron **OR** ondansetron **OR** ondansetron (ZOFRAN) IV **OR** ondansetron (ZOFRAN) IV, polyethylene glycol  Physical Exam         NAD Weak frail Breathing regular pattern  asleep, arouses breifly but falls back to sleep.   Vital Signs: BP (!) 158/93 (BP Location: Left Arm) Comment: rn was told  Pulse (!) 103   Temp 98.5 F (36.9 C) (Oral)   Resp 16   Ht '5\' 6"'$  (1.676 m)   Wt 64.4 kg (142 lb)   SpO2 100%   BMI 22.92 kg/m  SpO2: SpO2: 100 % O2 Device: O2 Device: Not Delivered O2 Flow Rate:    Intake/output summary:   Intake/Output Summary (Last 24 hours) at 06/24/16 1543 Last data filed at 06/24/16 1410  Gross per 24 hour  Intake            63.33 ml  Output              1500 ml  Net         -1436.67 ml   LBM: Last BM Date: 06/23/16 Baseline Weight: Weight: 61.2 kg (135 lb) Most recent weight: Weight: 64.4 kg (142 lb)       Palliative Assessment/Data:    Flowsheet Rows     Most  Recent Value  Intake Tab  Referral Department  Oncology  Unit at Time of Referral  Oncology Unit  Palliative Care Primary Diagnosis  Cancer  Palliative Care Type  New Palliative care  Reason for referral  Clarify Goals of Care  Date first seen by Palliative Care  06/19/16  Clinical Assessment  Palliative Performance Scale Score  20%  Pain Max last 24 hours  4  Pain Min Last 24 hours  3  Dyspnea Max Last 24 Hours  4  Dyspnea Min Last 24 hours  3  Nausea Max Last 24 Hours  4  Nausea Min Last 24 Hours  3  Psychosocial & Spiritual Assessment  Palliative Care Outcomes  Patient/Family meeting held?  Yes  Who was at the meeting?  daughter and friend.   Palliative Care Outcomes  Clarified goals of care      Patient Active Problem List   Diagnosis Date Noted  . Cancer associated pain 06/02/2016  . UTI (urinary tract infection) 05/26/2016  . Malignant cachexia ([place]) 05/26/2016  . Hypercalcemia 05/11/2016  . Metastasis to bone ([place]) 04/29/2016  . Goals of care, counseling/discussion 04/29/2016  . Pancreatic lesion 04/29/2016  . Iron deficiency anemia 04/15/2016  . Chemotherapy-induced peripheral neuropathy ([place]) 03/19/2016  . [employer] Federation of Gyne[employer] and Obstetrics (FIGO) stage IVB epithelial ovarian cancer ([place]) 02/28/2016  . Genetic testing 02/25/2016  . Family history of breast cancer in sister 01/27/2016  . Anemia due to antineoplastic chemotherapy 01/20/2016  . Port catheter in place 01/08/2016  . Chemotherapy induced neutropenia ([place]) 12/27/2015  . Malignant neoplasm metastatic to lung ([place]) 12/20/2015  . Ovarian cancer on right ([place]) 12/20/2015  . Pleural effusion 12/20/2015  . Pulmonary nodules/lesions, multiple 12/07/2015  .  Peritoneal carcinomatosis ([place]) 12/07/2015  . Primary peritoneal carcinomatosis ([place]) 12/03/2015  . Lower abdominal pain 10/10/2015  . GERD (gastroesophageal reflux disease) 04/16/2014  . Memory loss 12/06/2012  . Routine health maintenance 10/09/2011  . Controlled type 2 diabetes mellitus without complication ([place]) 69/48/5462  . Hyperlipidemia 12/21/2006  . Essential hypertension 12/21/2006    Palliative Care Assessment & Plan   Patient Profile:    Assessment:  ovarian cancer Frailty Functional decline Malignant cachexia Malignant pain Constipation Malignant hypercalcemia  Recommendations/Plan:  Goals of care:  - We discussed again today that Leslie Rowe has a life limiting illness that is not going to be cured and the goal of medical interventions is to add time and quality to her life.  - We discussed that the fact that she is not a candidate for further chemo does not mean that there is not care left to give, but rather means that we need to focus her care on living each dey as well as possible through aggressive symptom management. We discussed hospice as a tool that may be beneficial in this goal as she reaches a point where we are trying to fix problems that are not fixable. - Daughter reports plan to have follow-up conversation tomorrow with Dr. Alvy Bimler.  - We will continue efforts to have gentle honest conversations with patient and her family with regards to next steps and overall goals of care - Continue current mode of care for now.    Code Status:    Code Status Orders        Start     Ordered   06/18/16 7035  Do not attempt resuscitation (DNR)  Continuous    Question Answer Comment  In the event of cardiac or respiratory ARREST Do  not call a "code blue"   In the event of cardiac or respiratory ARREST Do not perform Intubation, CPR, defibrillation or ACLS   In the event of cardiac or respiratory ARREST Use medication by any route, position, wound care, and other  measures to relive pain and suffering. May use oxygen, suction and manual treatment of airway obstruction as needed for comfort.      06/18/16 1706    Code Status History    Date Active Date Inactive Code Status Order ID Comments User Context   05/11/2016 10:35 AM 05/13/2016  6:54 PM Full Code [id number]  Heath Lark, MD Inpatient    Advance Directive Documentation     Most Recent Value  Type of Advance Directive  Healthcare Power of Attorney  Pre-existing out of facility DNR order (yellow form or pink MOST form)  -  "MOST" Form in Place?  -       Prognosis:   guarded.   Discharge Planning:  To Be Determined  Care plan was discussed with  Patient and daughter  Thank you for allowing the Palliative Medicine Team to assist in the care of this patient.   Time In: 1420 Time Out: 1500 Total Time 40 Prolonged Time Billed  no       Greater than 50%  of this time was spent counseling and coordinating care related to the above assessment and plan.  Micheline Rough, MD   Please contact Palliative Medicine Team phone at [phone] for questions and concerns.

## 2016-06-24 NOTE — Progress Notes (Signed)
Nutrition Follow-up  DOCUMENTATION CODES:   Severe malnutrition in context of chronic illness  INTERVENTION:   Monitor magnesium, potassium, and phosphorus daily for at least 3 days, MD to replete as needed, as pt is at risk for refeeding syndrome given severe malnutrition and poor PO intake x months.  Continue Osmolite 1.5 @ 30 ml/hr via NGT, advance by 10 ml every 24 hours to goal rate of 55 ml/hr. Tube feeding regimen at goal rate provides 1980kcal (100% of needs), 82grams of protein, and 1064ml of H2O.   If IVF discontinued, recommend free water flushes of 200 ml QID (800 ml total)  RD will continue to monitor for tolerance and ability to advance.  NUTRITION DIAGNOSIS:   Malnutrition (Severe) related to chronic illness, cancer and cancer related treatments as evidenced by energy intake < or equal to 75% for > or equal to 1 month, percent weight loss, mild depletion of body fat, severe depletion of muscle mass.  Ongoing.  GOAL:   Patient will meet greater than or equal to 90% of their needs  Progressing.  MONITOR:   Labs, Weight trends, TF tolerance, I & O's, GOC  ASSESSMENT:   81 y.o. female  with past medical history of metastatic ovarian cancer  admitted on 06/18/2016 with severe anemia, dehydration, malignant hypercalcemia, declining functional status.  Patient in room with family at bedside. Pt continues to receive Osmolite 1.5 @ 30 ml/hr, tolerating with no issues. Pt is showing signs of refeeding syndrome (Low Mg, Phos, elevated CBGs). MD is repleting. Will continue to advance slowly every 24 hours. RN states she will advance up to 40 ml/hr this afternoon. Will continue to monitor  Medications: IV Decadron daily, IV Protonix daily, K-PHOS once Labs reviewed: CBGs: 252-257 Low Phos, Mg  Diet Order:  Diet NPO time specified Except for: Sips with Meds  Skin:  Reviewed, no issues  Last BM:  4/25  Height:   Ht Readings from Last 1 Encounters:  06/18/16  5\' 6"  (1.676 m)    Weight:   Wt Readings from Last 1 Encounters:  06/23/16 142 lb (64.4 kg)    Ideal Body Weight:  59.1 kg  BMI:  Body mass index is 22.92 kg/m.  Estimated Nutritional Needs:   Kcal:  1850-2050  Protein:  85-95g  Fluid:  1.9L/day  EDUCATION NEEDS:   Education needs addressed  Clayton Bibles, MS, RD, LDN Pager: [phone] After Hours Pager: [phone]

## 2016-06-24 NOTE — Progress Notes (Signed)
Pt tolerating TF well at this time. During report, RN noted that pt has NG tube, and pt states "and I hate it". Requires IV morphine about every 2 hours for pain control. Family at beside, continue to monitor. Hortencia Conradi RN

## 2016-06-25 LAB — GLUCOSE, CAPILLARY
GLUCOSE-CAPILLARY: 197 mg/dL — AB (ref 65–99)
GLUCOSE-CAPILLARY: 276 mg/dL — AB (ref 65–99)
Glucose-Capillary: 233 mg/dL — ABNORMAL HIGH (ref 65–99)
Glucose-Capillary: 285 mg/dL — ABNORMAL HIGH (ref 65–99)
Glucose-Capillary: 307 mg/dL — ABNORMAL HIGH (ref 65–99)
Glucose-Capillary: 260 mg/dL — ABNORMAL HIGH (ref 65–99)

## 2016-06-25 LAB — BASIC METABOLIC PANEL
Anion gap: 9 (ref 5–15)
BUN: 13 mg/dL (ref 6–20)
CHLORIDE: 102 mmol/L (ref 101–111)
CO2: 27 mmol/L (ref 22–32)
Calcium: 9.4 mg/dL (ref 8.9–10.3)
Creatinine, Ser: 0.53 mg/dL (ref 0.44–1.00)
GFR calc Af Amer: >60 mL/min (ref >60)
GFR calc non Af Amer: >60 mL/min (ref >60)
Glucose, Bld: 256 mg/dL — ABNORMAL HIGH (ref 65–99)
POTASSIUM: 3.9 mmol/L (ref 3.5–5.1)
SODIUM: 138 mmol/L (ref 135–145)

## 2016-06-25 LAB — PHOSPHORUS: Phosphorus: 2.3 mg/dL — ABNORMAL LOW (ref 2.5–4.6)

## 2016-06-25 LAB — MAGNESIUM: Magnesium: 1.7 mg/dL (ref 1.7–2.4)

## 2016-06-25 MED ORDER — POTASSIUM PHOSPHATES 15 MMOLE/5ML IV SOLN
30.0000 mmol | Freq: Once | INTRAVENOUS | Status: AC
  Administered 2016-06-25: 30 mmol via INTRAVENOUS
  Filled 2016-06-25: qty 10

## 2016-06-25 MED ORDER — OSMOLITE 1.5 CAL PO LIQD
1000.0000 mL | ORAL | Status: DC
  Administered 2016-06-25 – 2016-06-26 (×2): 1000 mL
  Filled 2016-06-25 (×3): qty 1000

## 2016-06-25 MED ORDER — INSULIN GLARGINE 100 UNIT/ML ~~LOC~~ SOLN
14.0000 [IU] | Freq: Every day | SUBCUTANEOUS | Status: DC
  Administered 2016-06-25 – 2016-06-27 (×3): 14 [IU] via SUBCUTANEOUS
  Filled 2016-06-25 (×4): qty 0.14

## 2016-06-25 NOTE — Progress Notes (Signed)
Leslie Rowe   DOB:02/14/1936   LK#:[id number]    Subjective: She continues to have intermittent abdominal pain.  She has some nausea but no vomiting.  Had small bowel movement this morning.  Continues to feel very weak.  She is staying in bed almost 24 hours day  Objective:  Vitals:   06/24/16 2234 06/25/16 0445  BP: (!) 148/84 (!) 171/94  Pulse: 100 (!) 121  Resp: 16 18  Temp: 99.1 F (37.3 C) 100 F (37.8 C)     Intake/Output Summary (Last 24 hours) at 06/25/16 0912 Last data filed at 06/25/16 0810  Gross per 24 hour  Intake           2788.5 ml  Output             2025 ml  Net            763.5 ml    GENERAL:alert, no distress and comfortable.  She looks weak, thin and cachectic.  NG in place SKIN: skin color, texture, turgor are normal, no rashes or significant lesions EYES: normal, Conjunctiva are pink and non-injected, sclera clear ABDOMEN:abdomen soft, non-tender and normal bowel sounds Musculoskeletal:no cyanosis of digits and no clubbing  NEURO: alert & oriented x 3 with fluent speech, no focal motor/sensory deficits   Labs:  Lab Results  Component Value Date   WBC 16.9 (H) 06/21/2016   HGB 9.0 (L) 06/21/2016   HCT 28.3 (L) 06/21/2016   MCV 76.3 (L) 06/21/2016   PLT 126 (L) 06/21/2016   NEUTROABS 14.7 (H) 06/21/2016    Lab Results  Component Value Date   NA 138 06/25/2016   K 3.9 06/25/2016   CL 102 06/25/2016   CO2 27 06/25/2016   Assessment & Plan:   Ovarian cancer on right [facility]) The patient is very frail and is experiencing a lot of side effects from recent radiation treatment I have a long discussion with the daughter With her progressive decline in performance status, the patient is not a candidate for further palliative chemotherapy I will get palliative care to talk to the daughter and discussed options related to home-based palliative care versus residential hospice; however, it is clear to me neither the daughter or the patient is willing to  sign up to hospice.  Anemia due to antineoplastic chemotherapy She had received 2 units of blood transfusion on 06/18/2016 Stable blood count. Recheck tomorrow  Malignant cachexia ([place]) Aspiration due to weakness Her appetite is very poor due to malignant cachexia and esophagitis She has not seen much benefit with Remeron or Marinol, stopedp Even with recent steroid treatment her appetite remained poor. With aspiration, thankfully, chestx-ray showedno evidence of pneumonia I have placed her on n.p.o. except for medication; I have discontinued a lot of oral medications Will allow ice chips for comfort She is getting most of medications intravenously We have further discussion about goals of care in relation to nutritional intake I did not order formal swallow evaluation because even if she needs thickened liquids, it is unlikely she would have meaningful oral intake by herself We discussed about trial of artificial nutrition feeding through NG tube After discussing the risk and benefit of such intervention, the patient and her daughter has indicated they want to try feeding tube. NG tube was placed on 06/21/2016 I would not place PEG tube due to presence of omental caking Obviously, with the desire for artificial nutrition, the patient is not a hospice candidate Titrating up goals of nutritional support up  to 55 mils per hour  Metastasis to bone [facility]) She haspainful bone lesion. CT demonstrated T7 involvement. She has recently completed radiation treatment. She will continue pain medication prn  Controlled type 2 diabetes mellitus without complication ([place]) She will continue insulin treatment and I plan to hold oral hypoglycemics due to poor oral intake I have addedinsulin Lantus in view of steroids inducedhyperglycemia, titrating up the dose today  Cancer associated pain and constipation She has intermittent abdominal pain, likely due to cancer It could also be  contributed by constipation We discussed aggressive laxative therapy For now, she will continue taking narcotic prescription  Severe protein calorie malnutrition Severe nausea, ?component of gastritis Appreciate dietitian follow-up Nausea is multifactorial, will add IV protonix We will continue NG feeding as tolerated. She is at high risk of refeeding syndrome  Significant electrolyte imbalance with hypomagnesemia, hypophosphatemiaand hypokalemia, refeeding syndrome Likely due to refeeding syndrome and poor oral intake We will replace intravenously  Malignant hypercalcemia Corrected serum calcium is high I will continuedexamethasone Continue IV fluid resuscitation  Intermittent confusion Likely due to malignant hypercalcemia Observe for now  CODE STATUS I have discussed with the patient's daughter I recommend DNR  Urinary retention and progressive weakness After much discussion, we agreed for Foley catheter placement  Goals of care I have extensive goals of care discussion with the patient and her daughter on multiple occasions, again today She is noted to have progressive decline  However, it appears that both her daughter and the patient wants to continue aggressive care with artificial feeding and she would not qualify for hospice at this point The daughter would like her to go home with advanced home care to manage nutritional feeding eventually  Discharge planning She is not ready for discharge She would need continuous NG feeding for sometime before discharge home, likely next week.  She has signs of refeeding syndrome and is dependent on intravenous replacement therapy for electrolyte imbalance   Heath Lark, MD 06/25/2016  9:12 AM

## 2016-06-25 NOTE — Progress Notes (Signed)
Daily Progress Note   Patient Name: Leslie Rowe       Date: 06/25/2016 DOB: 17-Oct-1935  Age: 81 y.o. MRN#: [id number] Attending Physician: Heath Lark, MD Primary Care Physician: Hoyt Koch, MD Admit Date: 06/18/2016  Reason for Consultation/Follow-up: Establishing goals of care  Subjective: Ms. Boulos is resting in bed.  I met today with her, her daughter, and her niece.  See below.  Length of Stay: 7  Current Medications: Scheduled Meds:  . dexamethasone  4 mg Intravenous Daily  . enoxaparin (LOVENOX) injection  40 mg Subcutaneous Q24H  . insulin aspart  0-24 Units Subcutaneous TID WC  . insulin glargine  14 Units Subcutaneous Daily  . lidocaine-prilocaine   Topical Once  . pantoprazole (PROTONIX) IV  40 mg Intravenous Q24H    Continuous Infusions: . sodium chloride 50 mL/hr at 06/25/16 0838  . feeding supplement (OSMOLITE 1.5 CAL) 1,000 mL (06/25/16 1128)  . ondansetron (ZOFRAN) IV      PRN Meds: acetaminophen, LORazepam, morphine injection, ondansetron **OR** ondansetron **OR** ondansetron (ZOFRAN) IV **OR** ondansetron (ZOFRAN) IV, polyethylene glycol  Physical Exam         NAD Weak frail Breathing regular pattern More awake today.   Vital Signs: BP (!) 167/84 (BP Location: Left Arm)   Pulse (!) 104   Temp 98.6 F (37 C) (Oral)   Resp 18   Ht '5\' 6"'$  (1.676 m)   Wt 62.2 kg (137 lb 3.2 oz)   SpO2 100%   BMI 22.14 kg/m  SpO2: SpO2: 100 % O2 Device: O2 Device: Not Delivered O2 Flow Rate:    Intake/output summary:   Intake/Output Summary (Last 24 hours) at 06/25/16 1655 Last data filed at 06/25/16 1328  Gross per 24 hour  Intake           2788.5 ml  Output             1425 ml  Net           1363.5 ml   LBM: Last BM Date: 06/24/16 Baseline  Weight: Weight: 61.2 kg (135 lb) Most recent weight: Weight: 62.2 kg (137 lb 3.2 oz)       Palliative Assessment/Data:    Flowsheet Rows     Most Recent Value  Intake Tab  Referral Department  Oncology  Unit at Time of Referral  Oncology Unit  Palliative Care Primary Diagnosis  Cancer  Date Notified  06/18/16  Palliative Care Type  New Palliative care  Reason for referral  Clarify Goals of Care  Date of Admission  06/18/16  Date first seen by Palliative Care  06/19/16  # of days Palliative referral response time  1 Day(s)  # of days IP prior to Palliative referral  0  Clinical Assessment  Palliative Performance Scale Score  20%  Pain Max last 24 hours  4  Pain Min Last 24 hours  3  Dyspnea Max Last 24 Hours  4  Dyspnea Min Last 24 hours  3  Nausea Max Last 24 Hours  4  Nausea Min Last 24 Hours  3  Psychosocial & Spiritual Assessment  Palliative Care Outcomes  Patient/Family meeting held?  Yes  Who was at the meeting?  daughter and friend.   Palliative Care Outcomes  Clarified goals of care      Patient Active Problem List   Diagnosis Date Noted  . Cancer associated pain 06/02/2016  . UTI (urinary tract infection) 05/26/2016  . Malignant cachexia ([place]) 05/26/2016  . Hypercalcemia 05/11/2016  . Metastasis to bone ([place]) 04/29/2016  . Goals of care, counseling/discussion 04/29/2016  . Pancreatic lesion 04/29/2016  . Iron deficiency anemia 04/15/2016  . Chemotherapy-induced peripheral neuropathy ([place]) 03/19/2016  . International Federation of Gynecology and Obstetrics (FIGO) stage IVB epithelial ovarian cancer ([place]) 02/28/2016  . Genetic testing 02/25/2016  . Family history of breast cancer in sister 01/27/2016  . Anemia due to antineoplastic chemotherapy 01/20/2016  . Port catheter in place 01/08/2016  . Chemotherapy induced neutropenia ([place]) 12/27/2015  . Malignant neoplasm metastatic to lung ([place]) 12/20/2015  . Ovarian cancer on right ([place]) 12/20/2015  . Pleural  effusion 12/20/2015  . Pulmonary nodules/lesions, multiple 12/07/2015  . Peritoneal carcinomatosis ([place]) 12/07/2015  . Primary peritoneal carcinomatosis ([place]) 12/03/2015  . Lower abdominal pain 10/10/2015  . GERD (gastroesophageal reflux disease) 04/16/2014  . Memory loss 12/06/2012  . Routine health maintenance 10/09/2011  . Controlled type 2 diabetes mellitus without complication ([place]) 70/62/3762  . Hyperlipidemia 12/21/2006  . Essential hypertension 12/21/2006    Palliative Care Assessment & Plan   Patient Profile:    Assessment:  ovarian cancer Frailty Functional decline Malignant cachexia Malignant pain Constipation Malignant hypercalcemia  Recommendations/Plan:  Goals of care:  - Reviewed conversation with Dr. Alvy Bimler this AM, including discussion of plans for potential discharge home. - We discussed again today that current tube feeds are really a temporary measure and Ms. Ramey has a life limiting illness that is not going to be cured regardless of interventions.  We discussed again that the goal of medical interventions is to add time and quality to her life.  When asked if she feels this is what we are doing with current plan, patient stated she, "wonders about that sometimes."  - We discussed that the fact that she is not a candidate for further chemo does not mean that there is not care left to give, but rather means that we need to focus her care on living each day as well as possible through aggressive symptom management. - We will continue efforts to have gentle honest conversations with patient and her family with regards to next steps and overall goals of care.  Her daughter remains invested in pursuing current therapies. I have the impression that Ms. Kuroda is beginning to question the benefit of continuing tube feeds. -  Continue current mode of care for now.    Code Status:    Code Status Orders        Start     Ordered   06/18/16 1705  Do not attempt  resuscitation (DNR)  Continuous    Question Answer Comment  In the event of cardiac or respiratory ARREST Do not call a "code blue"   In the event of cardiac or respiratory ARREST Do not perform Intubation, CPR, defibrillation or ACLS   In the event of cardiac or respiratory ARREST Use medication by any route, position, wound care, and other measures to relive pain and suffering. May use oxygen, suction and manual treatment of airway obstruction as needed for comfort.      06/18/16 1706    Code Status History    Date Active Date Inactive Code Status Order ID Comments User Context   05/11/2016 10:35 AM 05/13/2016  6:54 PM Full Code [id number]  Heath Lark, MD Inpatient    Advance Directive Documentation     Most Recent Value  Type of Advance Directive  Healthcare Power of Attorney  Pre-existing out of facility DNR order (yellow form or pink MOST form)  -  "MOST" Form in Place?  -       Prognosis:   guarded.   Discharge Planning:  To Be Determined  Care plan was discussed with  Patient, daughter, and patient's niece  Thank you for allowing the Palliative Medicine Team to assist in the care of this patient.   Time In: 1610 Time Out: 1700 Total Time 50 Prolonged Time Billed  no       Greater than 50%  of this time was spent counseling and coordinating care related to the above assessment and plan.  Micheline Rough, MD   Please contact Palliative Medicine Team phone at [phone] for questions and concerns.

## 2016-06-26 DIAGNOSIS — R4182 Altered mental status, unspecified: Secondary | ICD-10-CM

## 2016-06-26 LAB — CBC WITH DIFFERENTIAL/PLATELET
BASOS ABS: 0 10*3/uL (ref 0.0–0.1)
Basophils Relative: 0 %
EOS ABS: 0 10*3/uL (ref 0.0–0.7)
EOS PCT: 0 %
HCT: 29.3 % — ABNORMAL LOW (ref 36.0–46.0)
Hemoglobin: 9.1 g/dL — ABNORMAL LOW (ref 12.0–15.0)
Lymphocytes Relative: 3 %
Lymphs Abs: 0.8 10*3/uL (ref 0.7–4.0)
MCH: 24.2 pg — AB (ref 26.0–34.0)
MCHC: 31.1 g/dL (ref 30.0–36.0)
MCV: 77.9 fL — ABNORMAL LOW (ref 78.0–100.0)
Monocytes Absolute: 1.7 10*3/uL — ABNORMAL HIGH (ref 0.1–1.0)
Monocytes Relative: 7 %
Neutro Abs: 22.9 10*3/uL — ABNORMAL HIGH (ref 1.7–7.7)
Neutrophils Relative %: 90 %
Platelets: 137 10*3/uL — ABNORMAL LOW (ref 150–400)
RBC: 3.76 MIL/uL — AB (ref 3.87–5.11)
RDW: 16.7 % — ABNORMAL HIGH (ref 11.5–15.5)
WBC: 25.3 10*3/uL — AB (ref 4.0–10.5)

## 2016-06-26 LAB — GLUCOSE, CAPILLARY
GLUCOSE-CAPILLARY: 241 mg/dL — AB (ref 65–99)
GLUCOSE-CAPILLARY: 239 mg/dL — AB (ref 65–99)
Glucose-Capillary: 289 mg/dL — ABNORMAL HIGH (ref 65–99)
Glucose-Capillary: 226 mg/dL — ABNORMAL HIGH (ref 65–99)
Glucose-Capillary: 238 mg/dL — ABNORMAL HIGH (ref 65–99)
Glucose-Capillary: 229 mg/dL — ABNORMAL HIGH (ref 65–99)

## 2016-06-26 LAB — BASIC METABOLIC PANEL
Anion gap: 5 (ref 5–15)
BUN: 14 mg/dL (ref 6–20)
CO2: 28 mmol/L (ref 22–32)
Calcium: 9 mg/dL (ref 8.9–10.3)
Chloride: 101 mmol/L (ref 101–111)
Creatinine, Ser: 0.49 mg/dL (ref 0.44–1.00)
GFR calc non Af Amer: >60 mL/min (ref >60)
Glucose, Bld: 242 mg/dL — ABNORMAL HIGH (ref 65–99)
POTASSIUM: 4 mmol/L (ref 3.5–5.1)
Sodium: 134 mmol/L — ABNORMAL LOW (ref 135–145)

## 2016-06-26 LAB — PHOSPHORUS: Phosphorus: 2.5 mg/dL (ref 2.5–4.6)

## 2016-06-26 LAB — MAGNESIUM: Magnesium: 1.6 mg/dL — ABNORMAL LOW (ref 1.7–2.4)

## 2016-06-26 MED ORDER — INSULIN ASPART 100 UNIT/ML ~~LOC~~ SOLN
10.0000 [IU] | Freq: Once | SUBCUTANEOUS | Status: AC
  Administered 2016-06-26: 10 [IU] via SUBCUTANEOUS

## 2016-06-26 NOTE — Progress Notes (Signed)
[place] current CBG 229 no sliding scale insulin coverage do you want me to cover? Thanks   MD. Alen Blew paged

## 2016-06-26 NOTE — Progress Notes (Signed)
Oncology on call MD Ennever paged regarding patient CBG of 289 with no coverage order. Verbal order taken to give patient 10 units of novolog subcutaneous insulin one time dose for coverage of the CBG. Will continue to monitor.

## 2016-06-26 NOTE — Progress Notes (Signed)
Daily Progress Note   Patient Name: Leslie Rowe       Date: 06/26/2016 DOB: 04/28/1935  Age: 81 y.o. MRN#: [id number] Attending Physician: Heath Lark, MD Primary Care Physician: Hoyt Koch, MD Admit Date: 06/18/2016  Reason for Consultation/Follow-up: Establishing goals of care  Subjective: Ms. Anwar is sleeping in bed.  She was having pain and just received morphine.  Reports pain improved.    She reports being tired, needing to rest, and not wanting to discuss further today.  She is agreeable to me following up either tomorrow or Monday.  See below.  Length of Stay: 8  Current Medications: Scheduled Meds:  . dexamethasone  4 mg Intravenous Daily  . enoxaparin (LOVENOX) injection  40 mg Subcutaneous Q24H  . insulin aspart  0-24 Units Subcutaneous TID WC  . insulin glargine  14 Units Subcutaneous Daily  . lidocaine-prilocaine   Topical Once  . pantoprazole (PROTONIX) IV  40 mg Intravenous Q24H    Continuous Infusions: . sodium chloride 50 mL/hr at 06/25/16 0838  . feeding supplement (OSMOLITE 1.5 CAL) 1,000 mL (06/26/16 0834)  . ondansetron (ZOFRAN) IV      PRN Meds: acetaminophen, LORazepam, morphine injection, ondansetron **OR** ondansetron **OR** ondansetron (ZOFRAN) IV **OR** ondansetron (ZOFRAN) IV, polyethylene glycol  Physical Exam         NAD Weak frail Breathing regular pattern Sleepy but arousable.  Vital Signs: BP (!) 147/90 (BP Location: Left Arm)   Pulse (!) 108   Temp 99.2 F (37.3 C) (Oral)   Resp 18   Ht 5\' 6"  (1.676 m)   Wt 62.1 kg (137 lb)   SpO2 96%   BMI 22.11 kg/m  SpO2: SpO2: 96 % O2 Device: O2 Device: Not Delivered O2 Flow Rate:    Intake/output summary:   Intake/Output Summary (Last 24 hours) at 06/26/16 0914 Last data  filed at 06/26/16 4098  Gross per 24 hour  Intake          1185.83 ml  Output             1350 ml  Net          -164.17 ml   LBM: Last BM Date: 06/24/16 Baseline Weight: Weight: 61.2 kg (135 lb) Most recent weight: Weight: 62.1 kg (137 lb)  Palliative Assessment/Data:    Flowsheet Rows     Most Recent Value  Intake Tab  Referral Department  Oncology  Unit at Time of Referral  Oncology Unit  Palliative Care Primary Diagnosis  Cancer  Date Notified  06/18/16  Palliative Care Type  New Palliative care  Reason for referral  Clarify Goals of Care  Date of Admission  06/18/16  Date first seen by Palliative Care  06/19/16  # of days Palliative referral response time  1 Day(s)  # of days IP prior to Palliative referral  0  Clinical Assessment  Palliative Performance Scale Score  20%  Pain Max last 24 hours  4  Pain Min Last 24 hours  3  Dyspnea Max Last 24 Hours  4  Dyspnea Min Last 24 hours  3  Nausea Max Last 24 Hours  4  Nausea Min Last 24 Hours  3  Psychosocial & Spiritual Assessment  Palliative Care Outcomes  Patient/Family meeting held?  Yes  Who was at the meeting?  daughter and friend.   Palliative Care Outcomes  Clarified goals of care      Patient Active Problem List   Diagnosis Date Noted  . Cancer associated pain 06/02/2016  . UTI (urinary tract infection) 05/26/2016  . Malignant cachexia ([place]) 05/26/2016  . Hypercalcemia 05/11/2016  . Metastasis to bone ([place]) 04/29/2016  . Goals of care, counseling/discussion 04/29/2016  . Pancreatic lesion 04/29/2016  . Iron deficiency anemia 04/15/2016  . Chemotherapy-induced peripheral neuropathy ([place]) 03/19/2016  . International Federation of Gynecology and Obstetrics (FIGO) stage IVB epithelial ovarian cancer ([place]) 02/28/2016  . Genetic testing 02/25/2016  . Family history of breast cancer in sister 01/27/2016  . Anemia due to antineoplastic chemotherapy 01/20/2016  . Port catheter in place 01/08/2016  .  Chemotherapy induced neutropenia ([place]) 12/27/2015  . Malignant neoplasm metastatic to lung ([place]) 12/20/2015  . Ovarian cancer on right ([place]) 12/20/2015  . Pleural effusion 12/20/2015  . Pulmonary nodules/lesions, multiple 12/07/2015  . Peritoneal carcinomatosis ([place]) 12/07/2015  . Primary peritoneal carcinomatosis ([place]) 12/03/2015  . Lower abdominal pain 10/10/2015  . GERD (gastroesophageal reflux disease) 04/16/2014  . Memory loss 12/06/2012  . Routine health maintenance 10/09/2011  . Controlled type 2 diabetes mellitus without complication ([place]) 61/95/0932  . Hyperlipidemia 12/21/2006  . Essential hypertension 12/21/2006    Palliative Care Assessment & Plan   Patient Profile:    Assessment:  ovarian cancer Frailty Functional decline Malignant cachexia Malignant pain Constipation Malignant hypercalcemia  Recommendations/Plan:  Goals of care:  - In prior encounters, I have discussed with patient and her daughter that current tube feeds are really a temporary measure and Ms. Guardiola has a life limiting illness that is not going to be cured regardless of interventions.  We discussed that the goal of medical interventions is to add time and quality to her life. - We have discussed that the fact that she is not a candidate for further chemo does not mean that there is not care left to give, but rather means that we need to focus her care on living each day as well as possible through aggressive symptom management. - We will continue efforts to have gentle honest conversations with patient and her family with regards to next steps and overall goals of care.  Her daughter remains invested in pursuing current therapies. I have the impression that Ms. Biswell is beginning to question the benefit of continuing tube feeds. - Continue current mode of care for now.  -  Patient reports today that she is too tired to discuss today.  She denies pain or having other needs.   - Will attempt f/u again  tomorrow or Monday   Code Status:    Code Status Orders        Start     Ordered   06/18/16 1705  Do not attempt resuscitation (DNR)  Continuous    Question Answer Comment  In the event of cardiac or respiratory ARREST Do not call a "code blue"   In the event of cardiac or respiratory ARREST Do not perform Intubation, CPR, defibrillation or ACLS   In the event of cardiac or respiratory ARREST Use medication by any route, position, wound care, and other measures to relive pain and suffering. May use oxygen, suction and manual treatment of airway obstruction as needed for comfort.      06/18/16 1706    Code Status History    Date Active Date Inactive Code Status Order ID Comments User Context   05/11/2016 10:35 AM 05/13/2016  6:54 PM Full Code [id number]  Heath Lark, MD Inpatient    Advance Directive Documentation     Most Recent Value  Type of Advance Directive  Healthcare Power of Attorney  Pre-existing out of facility DNR order (yellow form or pink MOST form)  -  "MOST" Form in Place?  -       Prognosis:   guarded.   Discharge Planning:  To Be Determined  Care plan was discussed with  Patient  Thank you for allowing the Palliative Medicine Team to assist in the care of this patient.   Time In: 0900 Time Out: 0915 Total Time 15 Prolonged Time Billed  no       Greater than 50%  of this time was spent counseling and coordinating care related to the above assessment and plan.  Micheline Rough, MD   Please contact Palliative Medicine Team phone at [phone] for questions and concerns.

## 2016-06-26 NOTE — Progress Notes (Signed)
Leslie Rowe   DOB:[birth year]   MV#:[id number]    Subjective: Leslie Rowe reports a rather peaceful might. She was able to rest without any major complaints. She continues to have intermittent abdominal pain which is manageable. She denied any complications related to her tube feeds. She denied any nausea, vomiting or diarrhea. She is lethargic this morning but easily arousable and answers questions appropriately.  Objective:  Vitals:   06/25/16 2130 06/26/16 0540  BP: (!) 157/97 (!) 147/90  Pulse: (!) 109 (!) 108  Resp: 16 18  Temp: 98.6 F (37 C) 99.2 F (37.3 C)     Intake/Output Summary (Last 24 hours) at 06/26/16 0817 Last data filed at 06/26/16 6283  Gross per 24 hour  Intake          1185.83 ml  Output             1350 ml  Net          -164.17 ml    GENERAL:alert, Chronically ill-appearing woman without distress.  NG in place SKIN: skin color, texture, turgor are normal, no rashes or significant lesions EYES: normal, Conjunctiva are pink and non-injected, sclera clear ABDOMEN:abdomen soft, slightly tender with palpation and normal bowel sounds Musculoskeletal:no cyanosis of digits and no clubbing  NEURO: alert & oriented x 3 with fluent speech, no focal motor/sensory deficits   Labs:  Lab Results  Component Value Date   WBC 25.3 (H) 06/26/2016   HGB 9.1 (L) 06/26/2016   HCT 29.3 (L) 06/26/2016   MCV 77.9 (L) 06/26/2016   PLT 137 (L) 06/26/2016   NEUTROABS 22.9 (H) 06/26/2016    Lab Results  Component Value Date   NA 134 (L) 06/26/2016   K 4.0 06/26/2016   CL 101 06/26/2016   CO2 28 06/26/2016   Assessment & Plan:   Ovarian cancer on right [facility]) She is status post multiple therapies in the past and approaching end-stage status. She is on supportive care only with discussions are ongoing to transition to hospice once the patient is willing.  Anemia due to antineoplastic chemotherapy She had received 2 units of blood transfusion on 06/18/2016 Hemoglobin remains  stable.  Malignant cachexia (HCC) Aspiration due to weakness Currently receiving tube feedings via NG tube. Titrating up goals of nutritional support up to 55 mils per hour  Metastasis to bone [facility]) She haspainful bone lesion. CT demonstrated T7 involvement. She has recently completed radiation treatment. Pain is manageable at this time.  Controlled type 2 diabetes mellitus without complication ([place]) She will continue insulin treatment. Blood sugar has been no running slightly high. No adjustment needed.    Cancer associated pain and constipation She has intermittent abdominal pain, likely due to cancer Present reasonably controlled.  Severe protein calorie malnutrition Severe nausea, ?component of gastritis Appreciate dietitian follow-up We will continue NG feeding as tolerated. She is at high risk of refeeding syndrome  Significant electrolyte imbalance with hypomagnesemia, hypophosphatemiaand hypokalemia, refeeding syndrome Electrolytes were reviewed from 06/26/2016 and appears to be close to normal range.  Malignant hypercalcemia Calcium is declining slowly currently at 9.0. Corrected calcium is likely higher than that.  Intermittent confusion Likely due to malignant hypercalcemia Mental status dramatically changed.  CODE STATUS She is currently DO NOT RESUSCITATE.  Urinary retention and progressive weakness After much discussion, we agreed for Foley catheter placement  Goals of care Ongoing discussions with palliative medicine team as well.  Discharge planning She is not ready for discharge    [facility], MD  06/26/2016  8:17 AM

## 2016-06-26 NOTE — Progress Notes (Signed)
1322-Duby- C/o cough and congestion can patient have something to alleviate symptoms, she is NPO. Thanks   MD [facility] paged

## 2016-06-27 LAB — BASIC METABOLIC PANEL
Anion gap: 8 (ref 5–15)
BUN: 17 mg/dL (ref 6–20)
CHLORIDE: 98 mmol/L — AB (ref 101–111)
CO2: 28 mmol/L (ref 22–32)
Calcium: 9.4 mg/dL (ref 8.9–10.3)
Creatinine, Ser: 0.52 mg/dL (ref 0.44–1.00)
GFR calc Af Amer: >60 mL/min (ref >60)
GFR calc non Af Amer: >60 mL/min (ref >60)
Glucose, Bld: 297 mg/dL — ABNORMAL HIGH (ref 65–99)
POTASSIUM: 4.4 mmol/L (ref 3.5–5.1)
SODIUM: 134 mmol/L — AB (ref 135–145)

## 2016-06-27 LAB — GLUCOSE, CAPILLARY
GLUCOSE-CAPILLARY: 189 mg/dL — AB (ref 65–99)
GLUCOSE-CAPILLARY: 231 mg/dL — AB (ref 65–99)
GLUCOSE-CAPILLARY: 310 mg/dL — AB (ref 65–99)
Glucose-Capillary: 271 mg/dL — ABNORMAL HIGH (ref 65–99)
Glucose-Capillary: 240 mg/dL — ABNORMAL HIGH (ref 65–99)
Glucose-Capillary: 229 mg/dL — ABNORMAL HIGH (ref 65–99)

## 2016-06-27 LAB — PHOSPHORUS: Phosphorus: 2.3 mg/dL — ABNORMAL LOW (ref 2.5–4.6)

## 2016-06-27 LAB — MAGNESIUM: Magnesium: 1.6 mg/dL — ABNORMAL LOW (ref 1.7–2.4)

## 2016-06-27 MED ORDER — DEXTROSE-NACL 5-0.9 % IV SOLN
INTRAVENOUS | Status: DC
  Administered 2016-06-27: 16:00:00 via INTRAVENOUS

## 2016-06-27 MED ORDER — OSMOLITE 1.5 CAL PO LIQD
1000.0000 mL | ORAL | Status: DC
  Administered 2016-06-27 – 2016-06-28 (×2): 1000 mL
  Filled 2016-06-27 (×4): qty 1000

## 2016-06-27 NOTE — Progress Notes (Signed)
Dr Alen Blew called concerning clogged Panda tube, advised that they will contact IR tomorrow to address issue, hold feeding for now and add D5NS fluids until resolved. Will continue to monitor

## 2016-06-27 NOTE — Progress Notes (Signed)
Leslie Rowe   DOB:[birth year]   VE#:[id number]    Subjective: [name] reports no changes or complaints. She reports some mild nausea but no vomiting. Her abdominal pain is controlled with morphine. She denied any complications related to her tube feeds. She is lethargic this morning but easily arousable with a confusion or altered mental status.  Objective:  Vitals:   06/26/16 1958 06/27/16 0430  BP: (!) 151/87 (!) 170/91  Pulse: 97 (!) 108  Resp: 18 18  Temp: 98.1 F (36.7 C) 99.4 F (37.4 C)     Intake/Output Summary (Last 24 hours) at 06/27/16 0800 Last data filed at 06/27/16 0334  Gross per 24 hour  Intake             4993 ml  Output             1100 ml  Net             3893 ml    GENERAL: Chronically ill-appearing woman without distress.   SKIN: skin color, texture, turgor are normal, no rashes or significant lesions EYES: No scleral icterus. ABDOMEN:abdomen soft, slightly tender with palpation and normal bowel sounds Musculoskeletal:no cyanosis of digits and no clubbing  NEURO: alert & oriented x 3 with fluent speech, appeared lethargic and distracted.   Labs:  Lab Results  Component Value Date   WBC 25.3 (H) 06/26/2016   HGB 9.1 (L) 06/26/2016   HCT 29.3 (L) 06/26/2016   MCV 77.9 (L) 06/26/2016   PLT 137 (L) 06/26/2016   NEUTROABS 22.9 (H) 06/26/2016    Lab Results  Component Value Date   NA 134 (L) 06/27/2016   K 4.4 06/27/2016   CL 98 (L) 06/27/2016   CO2 28 06/27/2016   Assessment & Plan:   Ovarian cancer on right [facility]) She is status post multiple therapies in the past and approaching end-stage status. She is on supportive care only with  No change In No Active Treatment Indicated at This Time.  Anemia due to antineoplastic chemotherapy She had received 2 units of blood transfusion on 06/18/2016 Hemoglobin remains stable.  Malignant cachexia (HCC) Aspiration due to weakness Currently receiving tube feedings via NG tube. Titrating up goals of  nutritional support up to 55 mils per hour  Metastasis to bone [facility]) She haspainful bone lesion. CT demonstrated T7 involvement. She has recently completed radiation treatment. Pain is manageable at this time with morphine.  Controlled type 2 diabetes mellitus without complication ([place]) She will continue insulin treatment. Blood sugar has been no running slightly high but she is asymptomatic at this time.    Cancer associated pain and constipation She has intermittent abdominal pain, likely due to cancer Present reasonably controlled.  Severe protein calorie malnutrition Severe nausea, ?component of gastritis Appreciate dietitian follow-up We will continue NG feeding as tolerated. She is at high risk of refeeding syndrome  Significant electrolyte imbalance with hypomagnesemia, hypophosphatemiaand hypokalemia, refeeding syndrome Electrolytes were reviewed from 06/27/2016 and appears to be close to normal range.  Malignant hypercalcemia Calcium is declining slowly currently at 9.4  Intermittent confusion Likely due to malignant hypercalcemia Mental status is unchanged.  CODE STATUS She is currently DO NOT RESUSCITATE.  Urinary retention and progressive weakness Foley catheter remains in place.  Goals of care Ongoing discussions with palliative medicine team as well.  Discharge planning She is not ready for discharge    Leslie Crislip, MD 06/27/2016  8:00 AM

## 2016-06-27 NOTE — Progress Notes (Signed)
Nutrition Follow-up  DOCUMENTATION CODES:   Severe malnutrition in context of chronic illness  INTERVENTION:   Continue Continue Osmolite 1.5 @ 50 ml/hr via NGT. Would not advance further given expected EOL care now.  Tube feeding regimen at goal rate provides 1800kcal (97% of needs), 75grams of protein, and 961ml of H2O.   If IVF discontinued, recommend free water flushes of 200 ml QID (800 ml total)  RD will continue to monitor  NUTRITION DIAGNOSIS:   Malnutrition (Severe) related to chronic illness, cancer and cancer related treatments as evidenced by energy intake < or equal to 75% for > or equal to 1 month, percent weight loss, mild depletion of body fat, severe depletion of muscle mass.  Ongoing.  GOAL:   Patient will meet greater than or equal to 90% of their needs  Meeting.  MONITOR:   Labs, Weight trends, TF tolerance, I & O's  ASSESSMENT:   81 y.o. female  with past medical history of metastatic ovarian cancer  admitted on 06/18/2016 with severe anemia, dehydration, malignant hypercalcemia, declining functional status.  Patient in room with no family at bedside at time of visit. Pt now receiving Osmolite 1.5 at 50 ml/hr. Will not advance further given suggestion from [place] RD given pt's palliative care and expected EOL. Pt has been c/o nausea but no vomiting.  Pt expected to discharge 4/30. Pt will discharge with NGT feeds.   Medications: IV Decadron daily, IV Protonix daily, IV Zofran PRN Labs reviewed: CBGs: 240-310 Low Na, Mg, Phos  Diet Order:  Diet NPO time specified Except for: Sips with Meds, Ice Chips  Skin:  Reviewed, no issues  Last BM:  4/26  Height:   Ht Readings from Last 1 Encounters:  06/18/16 5\' 6"  (1.676 m)    Weight:   Wt Readings from Last 1 Encounters:  06/27/16 139 lb 4.8 oz (63.2 kg)    Ideal Body Weight:  59.1 kg  BMI:  Body mass index is 22.48 kg/m.  Estimated Nutritional Needs:   Kcal:   1850-2050  Protein:  85-95g  Fluid:  1.9L/day  EDUCATION NEEDS:   Education needs addressed  Clayton Bibles, MS, RD, LDN Pager: [phone] After Hours Pager: [phone]

## 2016-06-28 ENCOUNTER — Inpatient Hospital Stay (HOSPITAL_COMMUNITY): Payer: Medicare Other

## 2016-06-28 DIAGNOSIS — K9423 Gastrostomy malfunction: Secondary | ICD-10-CM

## 2016-06-28 LAB — GLUCOSE, CAPILLARY
GLUCOSE-CAPILLARY: 111 mg/dL — AB (ref 65–99)
GLUCOSE-CAPILLARY: 140 mg/dL — AB (ref 65–99)
GLUCOSE-CAPILLARY: 236 mg/dL — AB (ref 65–99)
Glucose-Capillary: 112 mg/dL — ABNORMAL HIGH (ref 65–99)
Glucose-Capillary: 128 mg/dL — ABNORMAL HIGH (ref 65–99)
Glucose-Capillary: 245 mg/dL — ABNORMAL HIGH (ref 65–99)

## 2016-06-28 LAB — PHOSPHORUS: PHOSPHORUS: 3 mg/dL (ref 2.5–4.6)

## 2016-06-28 LAB — BASIC METABOLIC PANEL
Anion gap: 7 (ref 5–15)
BUN: 14 mg/dL (ref 6–20)
CALCIUM: 9.5 mg/dL (ref 8.9–10.3)
CO2: 28 mmol/L (ref 22–32)
CREATININE: 0.45 mg/dL (ref 0.44–1.00)
Chloride: 101 mmol/L (ref 101–111)
GFR calc Af Amer: >60 mL/min (ref >60)
GFR calc non Af Amer: >60 mL/min (ref >60)
Glucose, Bld: 295 mg/dL — ABNORMAL HIGH (ref 65–99)
POTASSIUM: 3.8 mmol/L (ref 3.5–5.1)
SODIUM: 136 mmol/L (ref 135–145)

## 2016-06-28 LAB — MAGNESIUM: MAGNESIUM: 1.4 mg/dL — AB (ref 1.7–2.4)

## 2016-06-28 MED ORDER — MAGNESIUM SULFATE 2 GM/50ML IV SOLN
2.0000 g | Freq: Once | INTRAVENOUS | Status: AC
  Administered 2016-06-28: 2 g via INTRAVENOUS
  Filled 2016-06-28: qty 50

## 2016-06-28 MED ORDER — SODIUM BICARBONATE 650 MG PO TABS
650.0000 mg | ORAL_TABLET | Freq: Once | ORAL | Status: AC
  Administered 2016-06-28: 650 mg via ORAL

## 2016-06-28 MED ORDER — PANCRELIPASE (LIP-PROT-AMYL) 12000-38000 UNITS PO CPEP
24000.0000 [IU] | ORAL_CAPSULE | Freq: Once | ORAL | Status: AC
  Administered 2016-06-28: 24000 [IU] via ORAL
  Filled 2016-06-28: qty 2

## 2016-06-28 NOTE — Progress Notes (Signed)
Leslie Rowe   DOB:12/03/1935   RS#:[id number]    Subjective: Her NG tube is clogged.  She has not received any feeding since yesterday afternoon.  She continues to complain of significant pain in her abdomen along with nausea.  She had no bowel movement for over 3 days.  She appears weak and has been spending most of her time in the bed  Objective:  Vitals:   06/27/16 2205 06/28/16 0428  BP: (!) 149/97 (!) 161/84  Pulse: 97 (!) 105  Resp: 18 (!) 22  Temp: 98.6 F (37 C) 99.8 F (37.7 C)     Intake/Output Summary (Last 24 hours) at 06/28/16 0914 Last data filed at 06/27/16 1700  Gross per 24 hour  Intake           880.84 ml  Output             1200 ml  Net          -319.16 ml    GENERAL:alert, no distress and comfortable.  She looks thin and cachectic SKIN: skin color, texture, turgor are normal, no rashes or significant lesions EYES: normal, Conjunctiva are pink and non-injected, sclera clear. OROPHARYNX:no exudate, no erythema and lips, buccal mucosa, and tongue normal.  NG tube in situ NECK: supple, thyroid normal size, non-tender, without nodularity LYMPH:  no palpable lymphadenopathy in the cervical, axillary or inguinal LUNGS: clear to auscultation and percussion with normal breathing effort HEART: regular rate & rhythm and no murmurs and no lower extremity edema ABDOMEN:abdomen soft, mild diffuse tenderness, minimum bowel sounds Musculoskeletal:no cyanosis of digits and no clubbing  NEURO: alert but appears sleepy, with fluent speech, no focal motor/sensory deficits   Labs:  Lab Results  Component Value Date   WBC 25.3 (H) 06/26/2016   HGB 9.1 (L) 06/26/2016   HCT 29.3 (L) 06/26/2016   MCV 77.9 (L) 06/26/2016   PLT 137 (L) 06/26/2016   NEUTROABS 22.9 (H) 06/26/2016    Lab Results  Component Value Date   NA 136 06/28/2016   K 3.8 06/28/2016   CL 101 06/28/2016   CO2 28 06/28/2016    Assessment & Plan:   Ovarian cancer on right [facility]) The patient is very  frail and is experiencing a lot of side effects from recent radiation treatment I have a long discussion with the daughter With her progressive decline in performance status, the patient is not a candidate for further palliative chemotherapy I will get palliative care to talk to the daughter and discussed options related to home-based palliative care versus residential hospice; however, it is clear to me neither the daughter or the patient is willing to sign up to hospice.  Anemia due to antineoplastic chemotherapy She had received 2 units of blood transfusion on 06/18/2016 Stable blood count. Recheck at the end of the week  Malignant cachexia ([place]) Aspiration due to weakness Her appetite is very poor due to malignant cachexia and esophagitis She has not seen much benefit with Remeron or Marinol, stopped Even with recent steroid treatment her appetite remained poor. With aspiration, thankfully, chestx-ray showedno evidence of pneumonia I have placed her on n.p.o. except for medication; I have discontinued a lot of oral medications Will allow ice chips for comfort She is getting most of medications intravenously We have further discussion about goals of care in relation to nutritional intake I did not order formal swallow evaluation because even if she needs thickened liquids, it is unlikely she would have meaningful oral intake  by herself We discussed about trial of artificial nutrition feeding through NG tube After discussing the risk and benefit of such intervention, the patient and her daughter has indicated they want to try feeding tube. NG tube was placed on 06/21/2016 I would not place PEG tube due to presence of omental caking Obviously, with the desire for artificial nutrition, the patient is not a hospice candidate Titrating up goals of nutritional support up to 50 mils per hour  NG tube malfunction Her NG tube appears clogged on 06/27/2016, feeding discontinued because of  this Attempting to unclog the feeding tube. I spoke with her daughter about replacement of feeding tube or stopping NG feeding Her daughter wants her mom to continue NG feeding for now until the patient verbalized desire to discontinue NG feeding  Metastasis to bone [facility]) She haspainful bone lesion. CT demonstrated T7 involvement. She has recently completed radiation treatment. She will continue pain medication prn  Controlled type 2 diabetes mellitus without complication ([name]) She will continue insulin treatment and I plan to hold oral hypoglycemics due to poor oral intake I have addedinsulin Lantus in view of steroids inducedhyperglycemia, titrating up the dose last week I stopped the Lantus today because of discontinuation of feeding yesterday We will resume once we have consistent feeding We will continue sliding scale for now  Cancer associated pain and constipation She has intermittent abdominal pain, likely due to cancer It could also be contributed by constipation We discussed aggressive laxative therapy For now, she will continue taking narcotic prescription I have not ordered any enema/suppository Again, discuss goals of care with patient's daughter If we can reestablish NG access, will give her MiraLAX through NG tube  Severe protein calorie malnutrition Severe nausea, ?component of gastritis Appreciate dietitian follow-up Nausea is multifactorial, will add IV protonix We will continue NG feeding as tolerated. She is at high risk of refeeding syndrome  Significant electrolyte imbalance with hypomagnesemia, hypophosphatemiaand hypokalemia, refeeding syndrome Likely due to refeeding syndrome and poor oral intake We will replace intravenously  Malignant hypercalcemia Corrected serum calcium is high I will continuedexamethasone Continue IV fluid resuscitation  Intermittent confusion Likely due to malignant hypercalcemia Observe for now  CODE  STATUS I have discussed with the patient's daughter I recommend DNR  Urinary retention and progressive weakness After much discussion, we agreed for Foley catheter placement  Goals of care I have extensive goals of care discussion with the patient and her daughter on multiple occasions, again today She is noted to have progressive decline  However, it appears that both her daughter and the patient wants to continue aggressive care with artificial feeding and she would not qualify for hospice at this point The daughter would like her to go home with advanced home care to manage nutritional feeding eventually However, with clogging of NG tube and multiple issues and clinical signs of deterioration, I told her daughter today that going home on advanced home care is not an option Her daughter has indicated she would feel guilty if she were to stop her mother's feeding  She wants her mother to continue everything as is right now including NG tube replacement if needed unless the patient verbally indicated that she wants to stop everything and go home/hospice  Discharge planning She is not ready for discharge She would need continuous NG feeding and she has persistent electrolyte imbalance  I spent 35 minutes on patient's care and addressing goals of care today  Heath Lark, MD 06/28/2016  9:14 AM

## 2016-06-28 NOTE — Care Management Important Message (Signed)
Important Message  Patient Details  Name: Leslie Rowe MRN: [id number] Date of Birth: 09/22/1935   Medicare Important Message Given:  Yes    Kerin Salen 06/28/2016, 11:23 [place] Message  Patient Details  Name: Leslie Rowe MRN: [id number] Date of Birth: 07-15-1935   Medicare Important Message Given:  Yes    Kerin Salen 06/28/2016, 11:23 AM

## 2016-06-28 NOTE — Progress Notes (Signed)
Daily Progress Note   Patient Name: Leslie Rowe       Date: 06/28/2016 DOB: May 06, 1935  Age: 81 y.o. MRN#: [id number] Attending Physician: Heath Lark, MD Primary Care Physician: Hoyt Koch, MD Admit Date: 06/18/2016  Reason for Consultation/Follow-up: Establishing goals of care  Subjective: Ms. Keehan is sleeping in bed.  She appears sleepier to me than the past several days.  Arouses, but cannot really engage in conversation. Reports pain improved.    I had another long conversation with her daughter regarding her clinical course, decline, and wishes moving forward as she approaches the end of her life.  See below.  Length of Stay: 10  Current Medications: Scheduled Meds:  . dexamethasone  4 mg Intravenous Daily  . enoxaparin (LOVENOX) injection  40 mg Subcutaneous Q24H  . insulin aspart  0-24 Units Subcutaneous TID WC  . lidocaine-prilocaine   Topical Once  . pantoprazole (PROTONIX) IV  40 mg Intravenous Q24H    Continuous Infusions: . sodium chloride 50 mL/hr at 06/28/16 1334  . feeding supplement (OSMOLITE 1.5 CAL) 1,000 mL (06/28/16 1331)  . ondansetron (ZOFRAN) IV      PRN Meds: acetaminophen, LORazepam, morphine injection, ondansetron **OR** ondansetron **OR** ondansetron (ZOFRAN) IV **OR** ondansetron (ZOFRAN) IV, polyethylene glycol  Physical Exam         NAD Weak frail Breathing regular pattern Sleepy but arousable.  Vital Signs: BP 114/70 (BP Location: Right Arm)   Pulse 99   Temp 97.6 F (36.4 C) (Axillary)   Resp 16   Ht 5\' 6"  (1.676 m)   Wt 62.7 kg (138 lb 3 oz)   SpO2 100%   BMI 22.30 kg/m  SpO2: SpO2: 100 % O2 Device: O2 Device: Not Delivered O2 Flow Rate:    Intake/output summary:   Intake/Output Summary (Last 24 hours) at 06/28/16  2211 Last data filed at 06/28/16 1500  Gross per 24 hour  Intake                0 ml  Output             2400 ml  Net            -2400 ml   LBM: Last BM Date: 06/27/16 Baseline Weight: Weight: 61.2 kg (135 lb) Most recent weight: Weight: 62.7 kg (  138 lb 3 oz)       Palliative Assessment/Data:    Flowsheet Rows     Most Recent Value  Intake Tab  Referral Department  Oncology  Unit at Time of Referral  Oncology Unit  Palliative Care Primary Diagnosis  Cancer  Date Notified  06/18/16  Palliative Care Type  New Palliative care  Reason for referral  Clarify Goals of Care  Date of Admission  06/18/16  Date first seen by Palliative Care  06/19/16  # of days Palliative referral response time  1 Day(s)  # of days IP prior to Palliative referral  0  Clinical Assessment  Palliative Performance Scale Score  20%  Pain Leslie last 24 hours  4  Pain Min Last 24 hours  3  Dyspnea Leslie Last 24 Hours  4  Dyspnea Min Last 24 hours  3  Nausea Leslie Last 24 Hours  4  Nausea Min Last 24 Hours  3  Psychosocial & Spiritual Assessment  Palliative Care Outcomes  Patient/Family meeting held?  Yes  Who was at the meeting?  daughter and friend.   Palliative Care Outcomes  Clarified goals of care      Patient Active Problem List   Diagnosis Date Noted  . Cancer associated pain 06/02/2016  . UTI (urinary tract infection) 05/26/2016  . Malignant cachexia ([place]) 05/26/2016  . Hypercalcemia 05/11/2016  . Metastasis to bone ([place]) 04/29/2016  . Goals of care, counseling/discussion 04/29/2016  . Pancreatic lesion 04/29/2016  . Iron deficiency anemia 04/15/2016  . Chemotherapy-induced peripheral neuropathy ([place]) 03/19/2016  . International Federation of Gynecology and Obstetrics (FIGO) stage IVB epithelial ovarian cancer ([place]) 02/28/2016  . Genetic testing 02/25/2016  . Family history of breast cancer in sister 01/27/2016  . Anemia due to antineoplastic chemotherapy 01/20/2016  . Port catheter in  place 01/08/2016  . Chemotherapy induced neutropenia ([place]) 12/27/2015  . Malignant neoplasm metastatic to lung ([place]) 12/20/2015  . Ovarian cancer on right ([place]) 12/20/2015  . Pleural effusion 12/20/2015  . Pulmonary nodules/lesions, multiple 12/07/2015  . Peritoneal carcinomatosis ([place]) 12/07/2015  . Primary peritoneal carcinomatosis ([place]) 12/03/2015  . Lower abdominal pain 10/10/2015  . GERD (gastroesophageal reflux disease) 04/16/2014  . Memory loss 12/06/2012  . Routine health maintenance 10/09/2011  . Controlled type 2 diabetes mellitus without complication ([place]) 95/18/8416  . Hyperlipidemia 12/21/2006  . Essential hypertension 12/21/2006    Palliative Care Assessment & Plan   Patient Profile:    Assessment:  ovarian cancer Frailty Functional decline Malignant cachexia Malignant pain Constipation Malignant hypercalcemia  Recommendations/Plan:  Goals of care:  - In prior encounters, I have discussed with patient and her daughter that current tube feeds are really a temporary measure and Ms. Hoar has a life limiting illness that is not going to be cured regardless of interventions.  We discussed that the goal of medical interventions is to add time and quality to her life.  Today, Ms. Desta was too lethargic to participate and I spoke with her daughter at length.  We focused on the fact that she has continued to decline despite tube feeds.  I shared that, if the goal is for her mother to spend time outside of the hospital before her death, the window of opportunity for that to happen is coming to a close.  She agreed that she sees her declining and "today she just seems different." - We will continue efforts to have gentle honest conversations with patient and her family with regards to next  steps and overall goals of care.  I am hopeful after conversation today that her daughter is beginning to accept the severity of her situation. - Continue current mode of care for now.    Code  Status:    Code Status Orders        Start     Ordered   06/18/16 1705  Do not attempt resuscitation (DNR)  Continuous    Question Answer Comment  In the event of cardiac or respiratory ARREST Do not call a "code blue"   In the event of cardiac or respiratory ARREST Do not perform Intubation, CPR, defibrillation or ACLS   In the event of cardiac or respiratory ARREST Use medication by any route, position, wound care, and other measures to relive pain and suffering. May use oxygen, suction and manual treatment of airway obstruction as needed for comfort.      06/18/16 1706    Code Status History    Date Active Date Inactive Code Status Order ID Comments User Context   05/11/2016 10:35 AM 05/13/2016  6:54 PM Full Code [id number]  Heath Lark, MD Inpatient    Advance Directive Documentation     Most Recent Value  Type of Advance Directive  Healthcare Power of Attorney  Pre-existing out of facility DNR order (yellow form or pink MOST form)  -  "MOST" Form in Place?  -       Prognosis:   Poor  Discharge Planning:  To Be Determined  Care plan was discussed with  Patient and her daughter  Thank you for allowing the Palliative Medicine Team to assist in the care of this patient.   Time In: 1820 Time Out: 1900 Total Time 40 Prolonged Time Billed  no       Greater than 50%  of this time was spent counseling and coordinating care related to the above assessment and plan.  Micheline Rough, MD   Please contact Palliative Medicine Team phone at [phone] for questions and concerns.

## 2016-06-29 LAB — PHOSPHORUS: PHOSPHORUS: 1.5 mg/dL — AB (ref 2.5–4.6)

## 2016-06-29 LAB — GLUCOSE, CAPILLARY: Glucose-Capillary: 343 mg/dL — ABNORMAL HIGH (ref 65–99)

## 2016-06-29 LAB — BASIC METABOLIC PANEL
ANION GAP: 8 (ref 5–15)
BUN: 19 mg/dL (ref 6–20)
CALCIUM: 8.8 mg/dL — AB (ref 8.9–10.3)
CO2: 25 mmol/L (ref 22–32)
Chloride: 104 mmol/L (ref 101–111)
Creatinine, Ser: 0.52 mg/dL (ref 0.44–1.00)
Glucose, Bld: 288 mg/dL — ABNORMAL HIGH (ref 65–99)
POTASSIUM: 3.7 mmol/L (ref 3.5–5.1)
Sodium: 137 mmol/L (ref 135–145)

## 2016-06-29 LAB — MAGNESIUM: MAGNESIUM: 1.5 mg/dL — AB (ref 1.7–2.4)

## 2016-06-29 MED ORDER — BIOTENE DRY MOUTH MT LIQD: 15.0000 mL | OROMUCOSAL | Status: DC | PRN

## 2016-06-29 MED ORDER — HALOPERIDOL 0.5 MG PO TABS
0.5000 mg | ORAL_TABLET | ORAL | Status: DC | PRN
  Filled 2016-06-29: qty 1

## 2016-06-29 MED ORDER — POTASSIUM PHOSPHATES 15 MMOLE/5ML IV SOLN
30.0000 mmol | Freq: Once | INTRAVENOUS | Status: DC
  Filled 2016-06-29: qty 10

## 2016-06-29 MED ORDER — ACETAMINOPHEN 650 MG RE SUPP: 650.0000 mg | Freq: Four times a day (QID) | RECTAL | Status: DC | PRN

## 2016-06-29 MED ORDER — SODIUM CHLORIDE 0.9 % IV SOLN: 250.0000 mL | INTRAVENOUS | Status: DC | PRN

## 2016-06-29 MED ORDER — ONDANSETRON HCL 4 MG/2ML IJ SOLN: 4.0000 mg | Freq: Four times a day (QID) | INTRAMUSCULAR | Status: DC | PRN

## 2016-06-29 MED ORDER — MAGNESIUM SULFATE 2 GM/50ML IV SOLN
2.0000 g | Freq: Once | INTRAVENOUS | Status: AC
  Administered 2016-06-29: 2 g via INTRAVENOUS
  Filled 2016-06-29: qty 50

## 2016-06-29 MED ORDER — POLYVINYL ALCOHOL 1.4 % OP SOLN: 1.0000 [drp] | Freq: Four times a day (QID) | OPHTHALMIC | Status: DC | PRN

## 2016-06-29 MED ORDER — GLYCOPYRROLATE 0.2 MG/ML IJ SOLN
0.2000 mg | INTRAMUSCULAR | Status: DC | PRN
  Filled 2016-06-29: qty 1

## 2016-06-29 MED ORDER — ONDANSETRON 4 MG PO TBDP: 4.0000 mg | ORAL_TABLET | Freq: Four times a day (QID) | ORAL | Status: DC | PRN

## 2016-06-29 MED ORDER — HALOPERIDOL LACTATE 5 MG/ML IJ SOLN: 0.5000 mg | INTRAMUSCULAR | Status: DC | PRN

## 2016-06-29 MED ORDER — ACETAMINOPHEN 325 MG PO TABS: 650.0000 mg | ORAL_TABLET | Freq: Four times a day (QID) | ORAL | Status: DC | PRN

## 2016-06-29 MED ORDER — INSULIN GLARGINE 100 UNIT/ML ~~LOC~~ SOLN
12.0000 [IU] | Freq: Every day | SUBCUTANEOUS | Status: DC
  Filled 2016-06-29: qty 0.12

## 2016-06-29 MED ORDER — GLYCOPYRROLATE 1 MG PO TABS
1.0000 mg | ORAL_TABLET | ORAL | Status: DC | PRN
  Filled 2016-06-29: qty 1

## 2016-06-29 MED ORDER — HALOPERIDOL LACTATE 2 MG/ML PO CONC
0.5000 mg | ORAL | Status: DC | PRN
  Filled 2016-06-29: qty 0.3

## 2016-06-29 NOTE — Clinical Social Work Note (Signed)
Clinical Social Work Assessment  Patient Details  Name: Leslie Rowe MRN: [id number] Date of Birth: [birth year]  Date of referral:  06/29/16               Reason for consult:  End of Life/Hospice                Permission sought to share information with:  Family Supports Permission granted to share information::     Name::     Julisa Flippo  Agency::     Relationship::  daughter  Contact Information:  [phone]  Housing/Transportation Living arrangements for the past 2 months:  Single Family Home Source of Information:  Adult Children, Medical Team Patient Interpreter Needed:  None Criminal Activity/Legal Involvement Pertinent to Current Situation/Hospitalization:  No - Comment as needed Significant Relationships:  Adult Children, Siblings Lives with:  Adult Children Do you feel safe going back to the place where you live?  Yes Need for family participation in patient care:  Yes (Comment) (daughter primary decision maker)  Care giving concerns:  Pt from Home with daughter. Palliative care involvement has yielded family's decision to pursue Hospice care. Pt's daughter Dorris Carnes reports she feels hospice home is best option for pt to receive comfort care. Pt's sister at bedside also and concurs.    Social Worker assessment / plan:  CSW consulted for residential hospice placement. Met with pt and family at bedside. Family preference is Optometrist.  Made referral to [facility]; Harmon Pier took pt's information and will follow up with CSW.  Will continue following for residential hospice placement.   Employment status:  Retired Nurse, adult PT Recommendations:  Not assessed at this time Information / Referral to community resources:   [facility] )  Patient/Family's Response to care:  Family accepting and appreciative of care received.   Patient/Family's Understanding of and Emotional Response to Diagnosis, Current Treatment, and Prognosis:   Family demonstrates adequate understanding and acceptance of prognosis. Are calm and hopeful for transition to hospice care.   Emotional Assessment Appearance:  Appears stated age Attitude/Demeanor/Rapport:   (sleeping) Affect (typically observed):   (UTA- sleeping) Orientation:  Oriented to Self, Oriented to Place Alcohol / Substance use:  Not Applicable Psych involvement (Current and /or in the community):  No (Comment)  Discharge Needs  Concerns to be addressed:  No discharge needs identified Readmission within the last 30 days:  No Current discharge risk:  Terminally ill Barriers to Discharge:  No Barriers Identified   Nila Nephew, LCSW 06/29/2016, 10:50 AM

## 2016-06-29 NOTE — Progress Notes (Signed)
Spoke with pt's family and Harmon Pier with [facility]. Pt being reviewed for admission to [facility].   Will continue following for DC planning.   Sharren Bridge, MSW, LCSW Clinical Social Work 06/29/2016 [phone]

## 2016-06-29 NOTE — Progress Notes (Signed)
Leslie Rowe   DOB:12/01/1935   YT#:[id number]    Subjective: The patient is very sleepy and not much alert today.  Objective:  Vitals:   06/28/16 2025 06/29/16 0448  BP: 114/70 (!) 158/77  Pulse: 99 (!) 115  Resp: 16 18  Temp: 97.6 F (36.4 C) 97.7 F (36.5 C)     Intake/Output Summary (Last 24 hours) at 06/29/16 0957 Last data filed at 06/29/16 0449  Gross per 24 hour  Intake             3500 ml  Output             1600 ml  Net             1900 ml    GENERAL:alert, not distressed, somewhat unresponsive SKIN: skin color, texture, turgor are normal, no rashes or significant lesions NEURO: Not alert, agitated    Labs:  Lab Results  Component Value Date   WBC 25.3 (H) 06/26/2016   HGB 9.1 (L) 06/26/2016   HCT 29.3 (L) 06/26/2016   MCV 77.9 (L) 06/26/2016   PLT 137 (L) 06/26/2016   NEUTROABS 22.9 (H) 06/26/2016    Lab Results  Component Value Date   NA 137 06/29/2016   K 3.7 06/29/2016   CL 104 06/29/2016   CO2 25 06/29/2016    Studies:  Dg Abd 1 View  Result Date: 06/28/2016 CLINICAL DATA:  Feeding tube placement. EXAM: ABDOMEN - 1 VIEW COMPARISON:  KUB of June 21, 2016 FINDINGS: The radiodense tipped feeding tube is partially coiled in the fundus but the tip re- current kiss and extends toward the cardia. Further dance min is needed. The bowel gas pattern is normal. There are nodular densities throughout both lung bases. IMPRESSION: The tip of the feeding tube lies in the region of the gastric cardia. Advancement may be facilitated by placing the patient in the right side down decubitus position if the patient can tolerate this positioning. Electronically Signed   By: David  Martinique M.D.   On: 06/28/2016 12:35    Assessment & Plan:   Ovarian cancer on right ([place]) Anemia due to antineoplastic chemotherapy Malignant cachexia ([facility]) Aspiration due to weakness Metastasis to bone ([facility]) Controlled type 2 diabetes mellitus without complication ([facility]) Cancer  associated pain and constipation Severe protein calorie malnutrition Severe nausea, ?component of gastritis Significant electrolyte imbalance with hypomagnesemia, hypophosphatemiaand hypokalemia, refeeding syndrome Malignant hypercalcemia Intermittent confusion Urinary retention and progressive weakness  CODE STATUS DNR  Goals of care She has progressive clinical deterioration Spoke with daughter, will transition to comfort care; she agrees I will discontinue blood draws, vitals signs check and will initiate comfort measure order set  Discharge planning Awaiting residential hospice placement Appreciate case management and social worker assistance  Heath Lark, MD 06/29/2016  9:57 AM

## 2016-06-29 NOTE — Progress Notes (Signed)
Nutrition Brief Note  Chart reviewed. Pt now transitioning to comfort care.  No further nutrition interventions warranted at this time.   Elena Davia, MS, RD, LDN Pager: [phone] After Hours Pager: [phone]    

## 2016-06-29 NOTE — Progress Notes (Addendum)
Daily Progress Note   Patient Name: Leslie Rowe       Date: 06/29/2016 DOB: [birth year]  Age: 81 y.o. MRN#: [id number] Attending Physician: Heath Lark, MD Primary Care Physician: Hoyt Koch, MD Admit Date: 06/18/2016  Reason for Consultation/Follow-up: Establishing goals of care  Subjective: [name] is sleeping in bed.  She appears lethargic, does not arouse, does not verbalize See below   Length of Stay: 11  Current Medications: Scheduled Meds:  . dexamethasone  4 mg Intravenous Daily  . enoxaparin (LOVENOX) injection  40 mg Subcutaneous Q24H  . insulin aspart  0-24 Units Subcutaneous TID WC  . insulin glargine  12 Units Subcutaneous Daily  . lidocaine-prilocaine   Topical Once  . pantoprazole (PROTONIX) IV  40 mg Intravenous Q24H    Continuous Infusions: . sodium chloride 50 mL/hr at 06/28/16 1334  . feeding supplement (OSMOLITE 1.5 CAL) 1,000 mL (06/28/16 1331)  . ondansetron (ZOFRAN) IV    . potassium phosphate IVPB (mmol)      PRN Meds: acetaminophen, LORazepam, morphine injection, ondansetron **OR** ondansetron **OR** ondansetron (ZOFRAN) IV **OR** ondansetron (ZOFRAN) IV, polyethylene glycol  Physical Exam         NAD Weak frail Breathing labored pattern Sleepy not arousable. Abdomen distended Does not open eyes to voice command  Vital Signs: BP (!) 158/77 (BP Location: Right Arm)   Pulse (!) 115   Temp 97.7 F (36.5 C) (Axillary)   Resp 18   Ht 5\' 6"  (1.676 m)   Wt 61.7 kg (136 lb)   SpO2 99%   BMI 21.95 kg/m  SpO2: SpO2: 99 % O2 Device: O2 Device: Not Delivered O2 Flow Rate:    Intake/output summary:   Intake/Output Summary (Last 24 hours) at 06/29/16 0929 Last data filed at 06/29/16 0449  Gross per 24 hour  Intake             3500 ml    Output             1600 ml  Net             1900 ml   LBM: Last BM Date: 06/27/16 Baseline Weight: Weight: 61.2 kg (135 lb) Most recent weight: Weight: 61.7 kg (136 lb)       Palliative Assessment/Data:    Flowsheet Rows  Most Recent Value  Intake Tab  Referral Department  Oncology  Unit at Time of Referral  Oncology Unit  Palliative Care Primary Diagnosis  Cancer  Date Notified  06/18/16  Palliative Care Type  New Palliative care  Reason for referral  Clarify Goals of Care  Date of Admission  06/18/16  Date first seen by Palliative Care  06/19/16  # of days Palliative referral response time  1 Day(s)  # of days IP prior to Palliative referral  0  Clinical Assessment  Palliative Performance Scale Score  20%  Pain Max last 24 hours  4  Pain Min Last 24 hours  3  Dyspnea Max Last 24 Hours  4  Dyspnea Min Last 24 hours  3  Nausea Max Last 24 Hours  4  Nausea Min Last 24 Hours  3  Psychosocial & Spiritual Assessment  Palliative Care Outcomes  Patient/Family meeting held?  Yes  Who was at the meeting?  daughter and friend.   Palliative Care Outcomes  Clarified goals of care      Patient Active Problem List   Diagnosis Date Noted  . Cancer associated pain 06/02/2016  . UTI (urinary tract infection) 05/26/2016  . Malignant cachexia ([place]) 05/26/2016  . Hypercalcemia 05/11/2016  . Metastasis to bone ([place]) 04/29/2016  . Goals of care, counseling/discussion 04/29/2016  . Pancreatic lesion 04/29/2016  . Iron deficiency anemia 04/15/2016  . Chemotherapy-induced peripheral neuropathy ([place]) 03/19/2016  . International Federation of Gyne[employer] and Obstetrics (FIGO) stage IVB epithelial ovarian cancer ([place]) 02/28/2016  . Genetic testing 02/25/2016  . Family history of breast cancer in sister 01/27/2016  . Anemia due to antineoplastic chemotherapy 01/20/2016  . Port catheter in place 01/08/2016  . Chemotherapy induced neutropenia ([place]) 12/27/2015  . Malignant neoplasm  metastatic to lung ([place]) 12/20/2015  . Ovarian cancer on right ([place]) 12/20/2015  . Pleural effusion 12/20/2015  . Pulmonary nodules/lesions, multiple 12/07/2015  . Peritoneal carcinomatosis ([place]) 12/07/2015  . Primary peritoneal carcinomatosis ([place]) 12/03/2015  . Lower abdominal pain 10/10/2015  . GERD (gastroesophageal reflux disease) 04/16/2014  . Memory loss 12/06/2012  . Routine health maintenance 10/09/2011  . Controlled type 2 diabetes mellitus without complication ([place]) 94/70/9628  . Hyperlipidemia 12/21/2006  . Essential hypertension 12/21/2006    Palliative Care Assessment & Plan   Patient Profile:    Assessment:  ovarian cancer Frailty Functional decline Malignant cachexia Malignant pain Constipation Malignant hypercalcemia  Recommendations/Plan:  Goals of care:  -=call placed and reviewed patient's current condition as well as her hospitalization course from this past week with daughter Dorris Carnes over the phone this morning. Dorris Carnes recognizes that her mother is declining, and is at end of life. She is thankful for the tube feeds trial, she states the patient did get a few good days where she was more awake alert.   Dorris Carnes continues to struggle with hospice discussions, " I don't want to send her to hospice before its time, if she has any fight left in her, I want her to have that chance."  We discussed about the fact that the patient continues to decline from her serious non curable condition, that tube feeds now, in my opinion are causing more burden than benefit.  PLAN: Dorris Carnes will arrive later today, check in on her mother, discuss with bedside RN about her decision to proceed with residential hospice, await daughter's decision, continue to support patient and daughter holistically.     Code Status:    Code Status Orders  Start     Ordered   06/18/16 1705  Do not attempt resuscitation (DNR)  Continuous    Question Answer Comment  In the event of  cardiac or respiratory ARREST Do not call a "code blue"   In the event of cardiac or respiratory ARREST Do not perform Intubation, CPR, defibrillation or ACLS   In the event of cardiac or respiratory ARREST Use medication by any route, position, wound care, and other measures to relive pain and suffering. May use oxygen, suction and manual treatment of airway obstruction as needed for comfort.      06/18/16 1706    Code Status History    Date Active Date Inactive Code Status Order ID Comments User Context   05/11/2016 10:35 AM 05/13/2016  6:54 PM Full Code [id number]  Heath Lark, MD Inpatient    Advance Directive Documentation     Most Recent Value  Type of Advance Directive  Healthcare Power of Attorney  Pre-existing out of facility DNR order (yellow form or pink MOST form)  -  "MOST" Form in Place?  -       Prognosis:   hours to days, likely not more than 2 weeks at this point.   Discharge Planning:  To Be Determined, have re discussed benefits of hospice environment with daughter over the phone this am. Await her decision later today.   Care plan was discussed with RN, daughter over the phone.   Thank you for allowing the Palliative Medicine Team to assist in the care of this patient.   Time In: 8.30 Time Out: 9 Total Time 30 Prolonged Time Billed  no       Greater than 50%  of this time was spent counseling and coordinating care related to the above assessment and plan.  Loistine Chance, MD [phone]   Please contact Palliative Medicine Team phone at [phone] for questions and concerns.   ADDENDUM: 06-29-16 10:08 AM Received update from bedside RN Rollene Fare that the patient's daughter Dorris Carnes has arrived at the bedside, she wishes to now proceed with residential hospice placement, I have requested social worker consult to get this started.  Loistine Chance MD

## 2016-06-29 NOTE — Progress Notes (Signed)
Quiet night. Morphine and Ativan once during the night. TF's continuous at 50 ml/hr. Family at beside states she will be discharged home today with Hospice, all questions answered. Will continue to monitor.

## 2016-06-29 NOTE — Consult Note (Signed)
[facility] [employer] Received request from [place] for family interest in [facility]. Chart reviewed and met with family at bedside. [place] room is available tomorrow. Paper work completed. Dr. Orpah Melter to assume care per family preference. CSW aware.   Please fax discharge summary to [phone].  RN please call report to [phone].  Thank you,  Erling Conte, LCSW [phone]

## 2016-06-30 ENCOUNTER — Ambulatory Visit: Payer: Medicare Other

## 2016-06-30 ENCOUNTER — Ambulatory Visit: Payer: Medicare Other | Admitting: Hematology and Oncology

## 2016-06-30 ENCOUNTER — Other Ambulatory Visit: Payer: Medicare Other

## 2016-06-30 NOTE — Progress Notes (Signed)
Report called to [facility] at [facility]. Pt's daughter at bedside, updated with discharge status. Pt medicated for comfort.

## 2016-06-30 NOTE — Progress Notes (Signed)
LCSW following for discharge planning/disposition:  Pt going to [facility] today.  Patient will transport by [facility]. CSW completed medical necessity form and called for transport.  Family notified regarding discharge - daughter Dorris Carnes at bedside, agreeable to plan.  All information sent to facility by [place] Report number for RN:  [phone]  No other needs at this time   Plan: DC to [facility].   Sharren Bridge, MSW, LCSW Clinical Social Work 06/30/2016 [phone]

## 2016-06-30 NOTE — Progress Notes (Signed)
PMT progress note  Life limiting illness: Ovarian cancer  Patient is essentially unresponsive, but appears comfortable Has shallow regular breathing Has no non verbal indicators of distress or discomfort BP (!) 158/77 (BP Location: Right Arm)   Pulse (!) 115   Temp 97.7 F (36.5 C) (Axillary)   Resp 18   Ht 5\' 6"  (1.676 m)   Wt 55.8 kg (123 lb 1.6 oz)   SpO2 99%   BMI 19.87 kg/m  Trace edema S1 S2  No family at the bedside, discussed with Fenecia over the phone in detail on 06-29-16.  Agree with transfer to residential hospice this am.  Prognosis appears to be hours to days at this point Agree with current PRN medication use: Ativan and Morphine.   15 minutes spent [facility] palliative medicine team [phone]

## 2016-06-30 NOTE — Discharge Summary (Signed)
Physician Discharge Summary  Patient ID: Leslie Rowe MRN: [id number] DOB/AGE: April 08, 1935 81 y.o.  Admit date: 06/18/2016 Discharge date: 06/30/2016  Primary Care Physician:  Hoyt Koch, MD   Discharge Diagnoses:    Present on Admission: . Ovarian cancer on right ([place]) . Malignant neoplasm metastatic to lung ([place]) . Anemia due to antineoplastic chemotherapy . Metastasis to bone ([place]) . Hypercalcemia   Discharge Medications:  Allergies as of 06/30/2016      Reactions   Codeine Nausea And Vomiting      Medication List    STOP taking these medications   ciprofloxacin 250 MG tablet Commonly known as:  CIPRO   feeding supplement (ENSURE ENLIVE) Liqd   HYDROmorphone 4 MG tablet Commonly known as:  DILAUDID   ibuprofen 200 MG tablet Commonly known as:  ADVIL,MOTRIN   insulin lispro 100 UNIT/ML KiwkPen Commonly known as:  HUMALOG KWIKPEN   lidocaine 2 % solution Commonly known as:  XYLOCAINE   lidocaine-prilocaine cream Commonly known as:  EMLA   lisinopril-hydrochlorothiazide 20-12.5 MG tablet Commonly known as:  PRINZIDE,ZESTORETIC   loratadine 10 MG tablet Commonly known as:  CLARITIN   LORazepam 0.5 MG tablet Commonly known as:  ATIVAN   magnesium hydroxide 400 MG/5ML suspension Commonly known as:  MILK OF MAGNESIA   Melatonin 3 MG Caps   metFORMIN 1000 MG tablet Commonly known as:  GLUCOPHAGE   mirtazapine 15 MG tablet Commonly known as:  REMERON   multivitamin with minerals Tabs tablet   ondansetron 8 MG disintegrating tablet Commonly known as:  ZOFRAN ODT   ondansetron 8 MG tablet Commonly known as:  ZOFRAN   polyethylene glycol packet Commonly known as:  MIRALAX / GLYCOLAX   predniSONE 10 MG tablet Commonly known as:  DELTASONE   sucralfate 1 GM/10ML suspension Commonly known as:  CARAFATE       Disposition and Follow-up:   Significant Diagnostic Studies:  Dg Abd 1 View  Result Date: 06/28/2016 CLINICAL  DATA:  Feeding tube placement. EXAM: ABDOMEN - 1 VIEW COMPARISON:  KUB of June 21, 2016 FINDINGS: The radiodense tipped feeding tube is partially coiled in the fundus but the tip re- current kiss and extends toward the cardia. Further dance min is needed. The bowel gas pattern is normal. There are nodular densities throughout both lung bases. IMPRESSION: The tip of the feeding tube lies in the region of the gastric cardia. Advancement may be facilitated by placing the patient in the right side down decubitus position if the patient can tolerate this positioning. Electronically Signed   By: David  Martinique M.D.   On: 06/28/2016 12:35   Dg Abd 1 View  Result Date: 06/21/2016 CLINICAL DATA:  Panda feeding tube repositioning. Weakness and in adequate caloric intake. EXAM: ABDOMEN - 1 VIEW COMPARISON:  06/21/2016 FINDINGS: Based on positioning of the bowel, I expect that the stomach is considerably distended. The Panda type feeding tube projects in the expected location of the stomach antrum. IMPRESSION: 1. Distended stomach, with Panda feeding tube tip in the stomach antrum. Electronically Signed   By: Van Clines M.D.   On: 06/21/2016 18:05   Dg Abd 1 View  Result Date: 06/21/2016 CLINICAL DATA:  Enteric tube placement EXAM: ABDOMEN - 1 VIEW COMPARISON:  04/22/2016 CT abdomen/pelvis FINDINGS: Weighted enteric tube terminates in the proximal stomach. No disproportionately dilated small bowel loops. Minimal colonic stool. No evidence of pneumatosis or pneumoperitoneum. Re- demonstration of bibasilar pulmonary nodules. No radiopaque urolithiasis. IMPRESSION: Weighted enteric  tube terminates in the proximal stomach. Nonobstructive bowel gas pattern. Re- demonstration of bibasilar pulmonary nodules. Electronically Signed   By: Ilona Sorrel M.D.   On: 06/21/2016 14:24   Dg Chest Port 1 View  Result Date: 06/21/2016 CLINICAL DATA:  Chest pain and shortness of Breath EXAM: PORTABLE CHEST 1 VIEW COMPARISON:   05/11/2016 FINDINGS: Cardiac shadow is stable. Right-sided chest wall port is again noted with the tip at the cavoatrial junction. Diffuse bilateral pulmonary nodularity is again identified and stable. IMPRESSION: Stable bilateral pulmonary nodules. No new focal infiltrate is noted. Electronically Signed   By: Inez Catalina M.D.   On: 06/21/2016 10:54    Discharge Laboratory Values: Lab Results  Component Value Date   WBC 25.3 (H) 06/26/2016   HGB 9.1 (L) 06/26/2016   HCT 29.3 (L) 06/26/2016   MCV 77.9 (L) 06/26/2016   PLT 137 (L) 06/26/2016   Lab Results  Component Value Date   NA 137 06/29/2016   K 3.7 06/29/2016   CL 104 06/29/2016   CO2 25 06/29/2016    Brief H and P: For complete details please refer to admission H and P, but in brief, the patient was admitted for management of severe protein calorie malnutrition, failure to thrive, malignant hypercalcemia, electrolyte imbalance and severe anemia The patient has received aggressive supportive care with blood transfusion, electrolyte replacement therapy, NG tube feeding, IV fluids as well as pain management.  Unfortunately, the patient continues to deteriorate clinically.  Palliative care service was involved for goals of care discussion and ultimately, the decision was made on 06/29/2016 to transition her goals of treatment to comfort measures only.  The patient is subsequently discharged to residential hospice facility  Physical Exam at Discharge: BP (!) 158/77 (BP Location: Right Arm)   Pulse (!) 115   Temp 97.7 F (36.5 C) (Axillary)   Resp 18   Ht 5\' 6"  (1.676 m)   Wt 123 lb 1.6 oz (55.8 kg)   SpO2 99%   BMI 19.87 kg/m  GENERAL:alert, no distress and comfortable NEURO: Not alert, somewhat agitated  Hospital Course:  Active Problems:   Ovarian cancer on right [facility])   Controlled type 2 diabetes mellitus without complication (HCC)   Malignant neoplasm metastatic to lung ([place])   Anemia due to antineoplastic chemotherapy    Metastasis to bone [facility])   Hypercalcemia  Code status: DNR Please leave Foley catheter in place  Diet:  NPO  Activity:  As tolerated  Condition at Discharge:   stable  Signed: Dr. Heath Lark [phone]  06/30/2016, 9:38 AM

## 2016-07-22 ENCOUNTER — Ambulatory Visit: Payer: Medicare Other | Admitting: Radiation Oncology

## 2016-07-30 DEATH — deceased

## 2016-12-06 ENCOUNTER — Other Ambulatory Visit: Payer: Self-pay | Admitting: Nurse Practitioner

## 2018-10-28 IMAGING — CT CT ABD-PELV W/ CM
2 of 5 series · 13 of 46 positions shown, 15 images · IV contrast (ISOVUE)
Comparison: CT the chest, abdomen and pelvis 02/04/2016.

CLINICAL DATA: 80-year-old female with history of ovarian cancer
diagnosed in October 2015. Ongoing chemotherapy. No current
complaints.

EXAM:
CT CHEST, ABDOMEN, AND PELVIS WITH CONTRAST
TECHNIQUE: Multidetector CT imaging of the chest, abdomen and pelvis was
performed following the standard protocol during bolus
administration of intravenous contrast.
CONTRAST:  100mL [id number]-[id number] IOPAMIDOL ([id number]) INJECTION 61%

[Series 2: abd/pel with · axial · 0.74mm/px · z∈[+1140,+1630]mm · 10 of 118 slices shown, 12 images]
[im 10/118  soft-tissue]
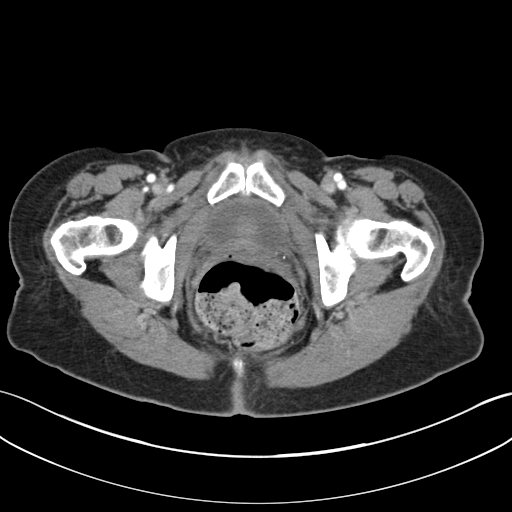
[im 10/118  bone]
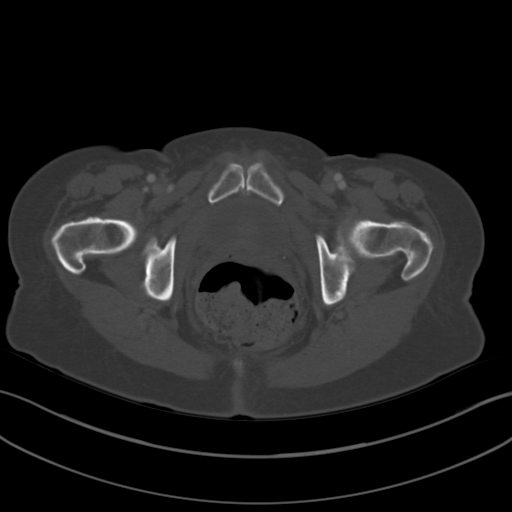
[im 20/118  soft-tissue]
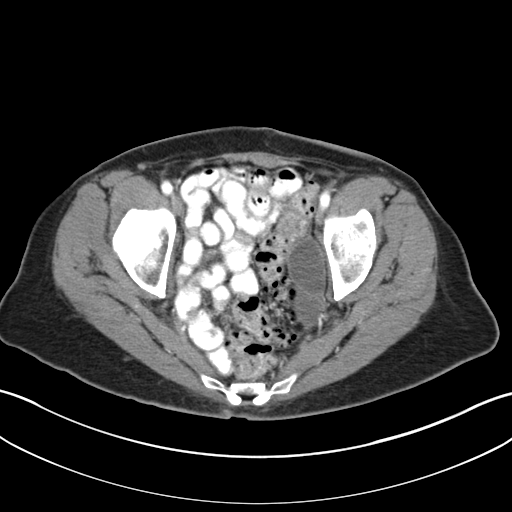
[im 30/118  soft-tissue]
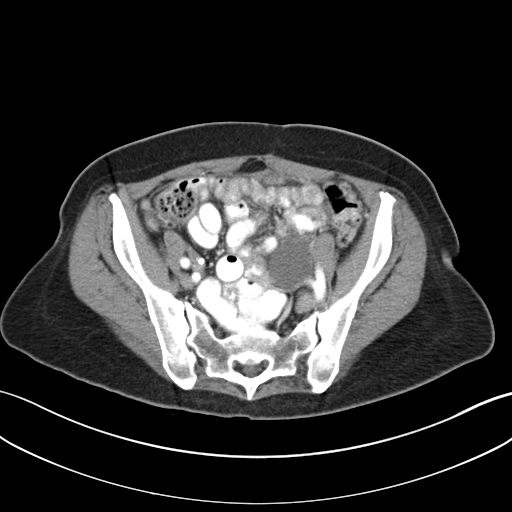
[im 40/118  soft-tissue]
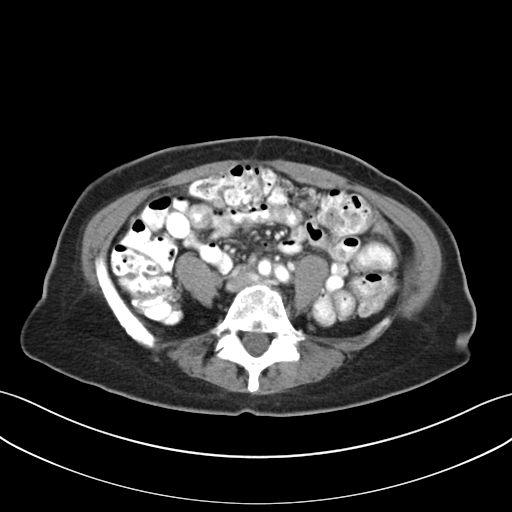
[im 49/118  soft-tissue]
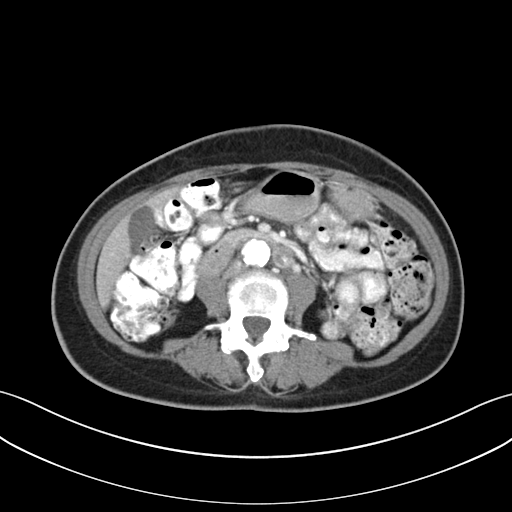
[im 69/118  soft-tissue]
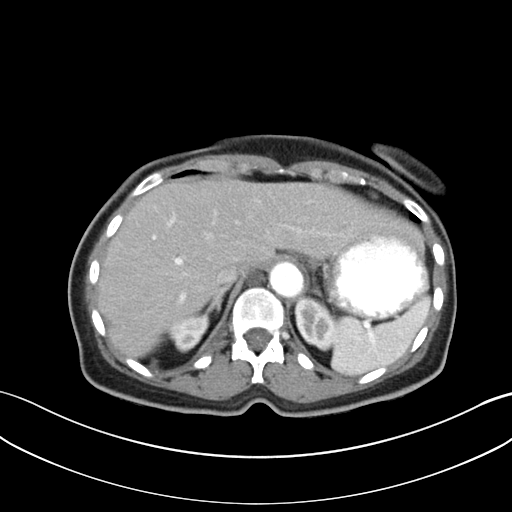
[im 79/118  soft-tissue]
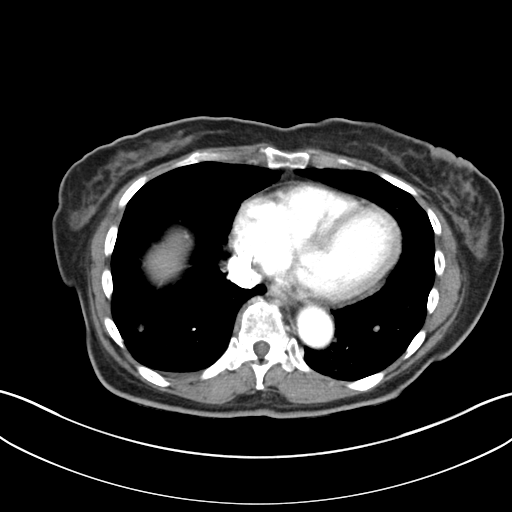
[im 88/118  soft-tissue]
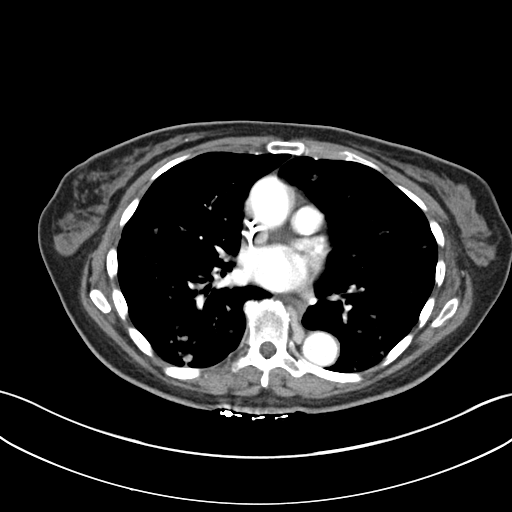
[im 98/118  soft-tissue]
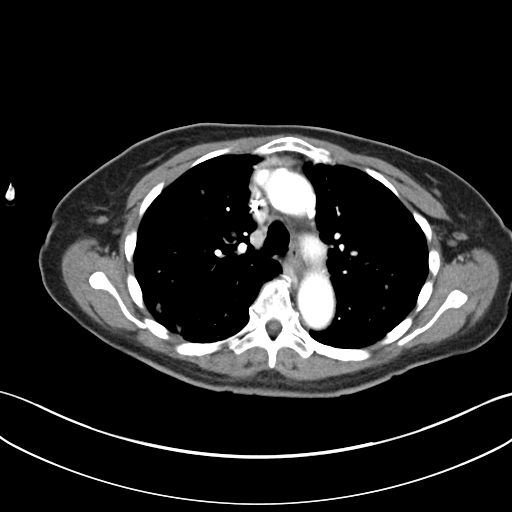
[im 98/118  bone]
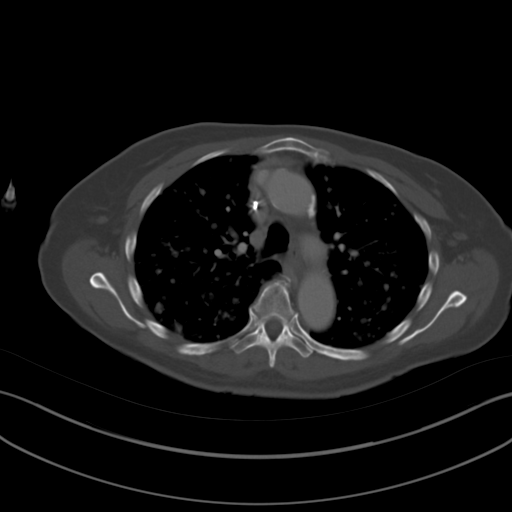
[im 108/118  soft-tissue]
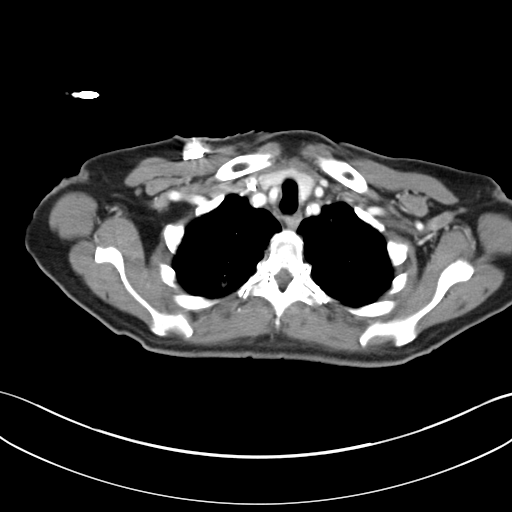

[Series 5: coronal a/|p · coronal · 0.74mm/px · 3 of 127 slices shown]
[im 43/127  soft-tissue]
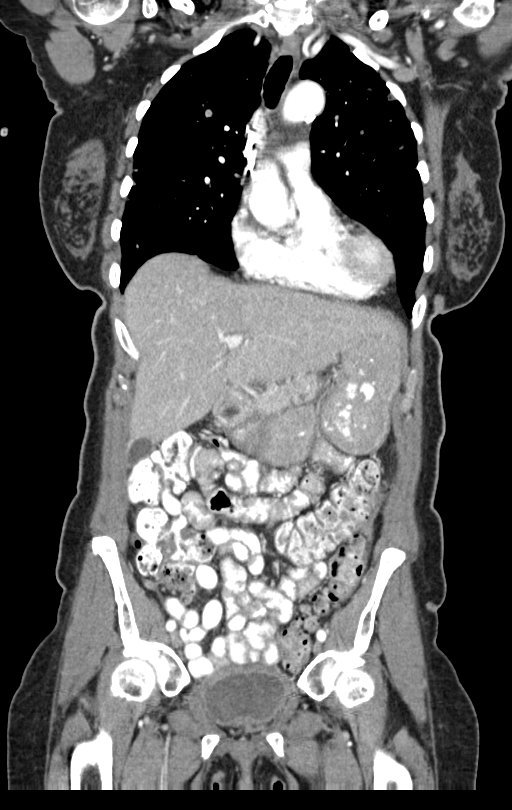
[im 57/127  soft-tissue]
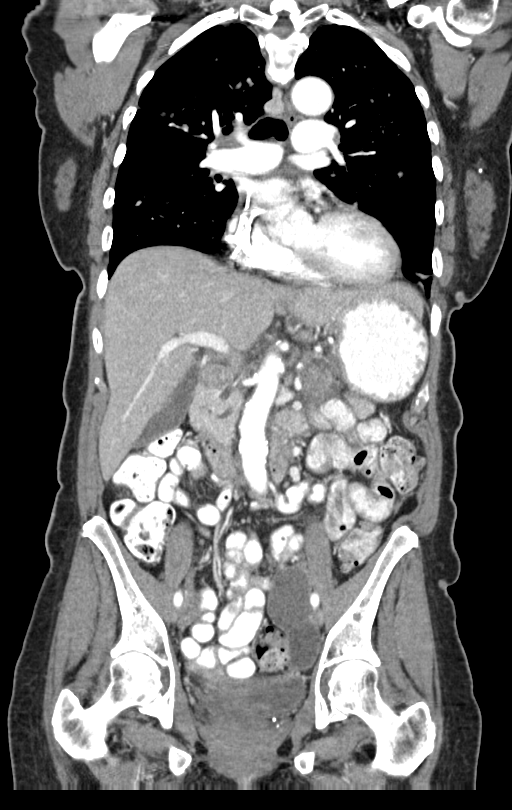
[im 71/127  soft-tissue]
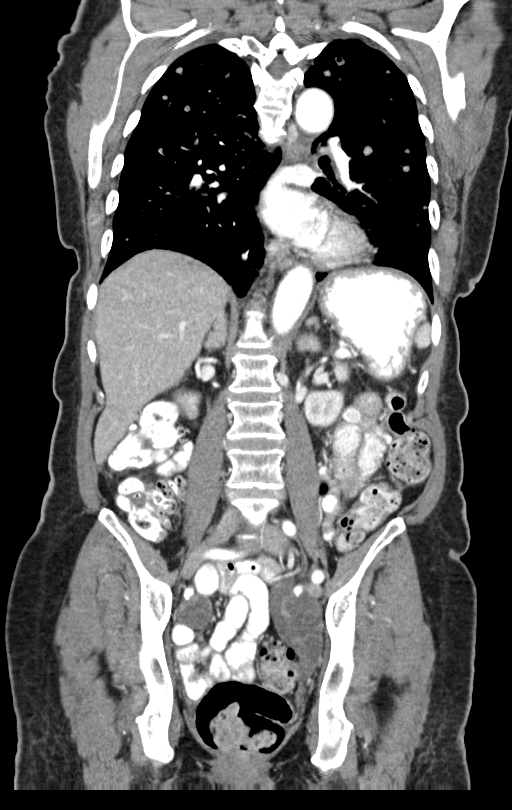

[13 of 46 positions shown; findings below may reference images not displayed]

FINDINGS: CT CHEST FINDINGS

Cardiovascular: Heart size is normal. There is no significant
pericardial fluid, thickening or pericardial calcification. There is
aortic atherosclerosis, as well as atherosclerosis of the great
vessels of the mediastinum and the coronary arteries, including
calcified atherosclerotic plaque in the left anterior descending and
right coronary arteries. Right internal jugular single-lumen porta
cath with tip terminating in the right atrium.

Mediastinum/Nodes: Right hilar lymph node measuring up to 12 mm in
short axis. No mediastinal lymphadenopathy. Esophagus is
unremarkable in appearance. No axillary lymphadenopathy.

Lungs/Pleura: Again noted are innumerable pulmonary nodules, many of
which are centrally cavitary. These nodules appear stable and/or
increased in size compared to the prior examination, but there are
clearly many more new nodules than seen on the prior study. The
largest single nodule on today's examination currently measures 18 x
11 mm in the superior segment of the left lower lobe (image 55 of
series 4) previously 17 x 7 mm on 02/04/2016. No confluent
consolidative airspace disease. Trace right pleural effusion lying
dependently, slightly smaller than the prior study.

Musculoskeletal: 17 mm sclerotic lesion in the C7 vertebral body
slightly to the left of midline, increasing in size compared to the
prior study, compatible with a metastatic lesion.

CT ABDOMEN PELVIS FINDINGS

Hepatobiliary: 11 mm lesion in segment 7 is similar in size to the
prior study, previously characterized as a cavernous hemangioma.
Other previously noted 9 mm lesion in segment 6 appears
hypervascular on today's study, previously low-attenuation,
presumably a small cavernous hemangioma. New ill-defined
hypovascular lesion in segment 4B measuring 9 mm (image 66 of series
2) is indeterminate, but suspicious for potential metastasis. No
intra or extrahepatic biliary ductal dilatation. Gallbladder is
normal in appearance.

Pancreas: Enlarging intermediate attenuation lesion with peripheral
rim enhancement in the body of the pancreas (image 60 of series 2)
measuring 3.0 x 2.6 cm, with distal ductal dilatation throughout the
remaining body and tail of the pancreas.

Spleen: Unremarkable.

Adrenals/Urinary Tract: Bilateral kidneys and bilateral adrenal
glands are normal in appearance. There is no hydroureteronephrosis.
Urinary bladder is normal in appearance.

Stomach/Bowel: Normal appearance of the stomach. No pathologic
dilatation of small bowel or colon. Normal appendix.

Vascular/Lymphatic: Aortic atherosclerosis, without evidence of
aneurysm or dissection in the abdominal or pelvic vasculature. No
lymphadenopathy noted in the abdomen or pelvis.

Reproductive: Status post hysterectomy. Bilateral cystic ovarian
masses (left greater than right) appear slightly smaller when
compared to the prior examination. Right ovarian mass currently
measures 4.3 x 2.7 x 2.8 cm. Left ovarian mass currently measures
6.5 x 4.2 x 8.4 cm.

Other: Omental caking noted on the prior study is less pronounced on
today's examination, and is best visualized in the low anatomic
pelvis where it measures up to 9 mm in thickness. No significant
volume of ascites. No pneumoperitoneum. Well-defined low-attenuation
lesion beneath the crus of the right hemidiaphragm, similar
appearance to prior studies, favored to be a benign lesion (likely a
mildly dilated cisterna chyli).

Musculoskeletal: There are no aggressive appearing lytic or blastic
lesions noted in the visualized portions of the skeleton.
IMPRESSION: 1. Today's study demonstrates a mixed response to therapy.
Specifically, while the primary ovarian lesions and the omental
caking has regressed compared to the prior examination, there is an
increased number and size of pulmonary nodules, possible new hepatic
lesion, enlarging lesion in the C7 vertebral body, and an enlarging
lesion in the distal body of the pancreas.
2. Lesion in the distal body of the pancreas has nonspecific imaging
characteristics. This could represent a metastatic lesion.
Alternatively, a primary pancreatic neoplasm could be considered. In
the appropriate clinical setting, the possibility of a benign lesion
such as a pancreatic pseudocyst could be considered, particularly if
there is a recent history of pancreatitis.
3. Additional incidental findings, as above.
